# Patient Record
Sex: Male | Born: 1960
Health system: Southern US, Community
[De-identification: ages and names within clinical notes are randomized; demographics above are authoritative.]

## PROBLEM LIST (undated history)

## (undated) DIAGNOSIS — T7840XA Allergy, unspecified, initial encounter: Secondary | ICD-10-CM

## (undated) DIAGNOSIS — K7460 Unspecified cirrhosis of liver: Secondary | ICD-10-CM

## (undated) DIAGNOSIS — D696 Thrombocytopenia, unspecified: Secondary | ICD-10-CM

## (undated) DIAGNOSIS — I10 Essential (primary) hypertension: Secondary | ICD-10-CM

## (undated) DIAGNOSIS — D649 Anemia, unspecified: Secondary | ICD-10-CM

## (undated) DIAGNOSIS — D126 Benign neoplasm of colon, unspecified: Secondary | ICD-10-CM

## (undated) DIAGNOSIS — K769 Liver disease, unspecified: Secondary | ICD-10-CM

## (undated) DIAGNOSIS — N189 Chronic kidney disease, unspecified: Secondary | ICD-10-CM

## (undated) DIAGNOSIS — K2970 Gastritis, unspecified, without bleeding: Secondary | ICD-10-CM

## (undated) DIAGNOSIS — K7689 Other specified diseases of liver: Secondary | ICD-10-CM

## (undated) DIAGNOSIS — B9681 Helicobacter pylori [H. pylori] as the cause of diseases classified elsewhere: Secondary | ICD-10-CM

## (undated) DIAGNOSIS — D732 Chronic congestive splenomegaly: Secondary | ICD-10-CM

## (undated) DIAGNOSIS — G4730 Sleep apnea, unspecified: Secondary | ICD-10-CM

## (undated) HISTORY — DX: Benign neoplasm of colon, unspecified: D12.6

## (undated) HISTORY — DX: Unspecified cirrhosis of liver: K74.60

## (undated) HISTORY — PX: BONE MARROW BIOPSY: SHX199

## (undated) HISTORY — DX: Sleep apnea, unspecified: G47.30

## (undated) HISTORY — DX: Thrombocytopenia, unspecified: D69.6

## (undated) HISTORY — DX: Allergy, unspecified, initial encounter: T78.40XA

## (undated) HISTORY — DX: Chronic kidney disease, unspecified: N18.9

## (undated) HISTORY — DX: Helicobacter pylori (H. pylori) as the cause of diseases classified elsewhere: B96.81

## (undated) HISTORY — DX: Chronic congestive splenomegaly: D73.2

## (undated) HISTORY — DX: Anemia, unspecified: D64.9

## (undated) HISTORY — PX: TOOTH EXTRACTION: SUR596

## (undated) HISTORY — DX: Gastritis, unspecified, without bleeding: K29.70

## (undated) HISTORY — DX: Liver disease, unspecified: K76.9

## (undated) HISTORY — DX: Other specified diseases of liver: K76.89

## (undated) HISTORY — DX: Essential (primary) hypertension: I10

---

## 2001-06-26 ENCOUNTER — Encounter: Payer: Self-pay | Admitting: Family Medicine

## 2001-06-26 ENCOUNTER — Encounter: Admission: RE | Admit: 2001-06-26 | Discharge: 2001-06-26 | Payer: Self-pay | Admitting: Family Medicine

## 2006-02-10 ENCOUNTER — Ambulatory Visit: Payer: Self-pay | Admitting: Family Medicine

## 2006-02-10 LAB — CONVERTED CEMR LAB
ALT: 87 units/L — ABNORMAL HIGH (ref 0–40)
AST: 91 units/L — ABNORMAL HIGH (ref 0–37)
BUN: 13 mg/dL (ref 6–23)
CO2: 30 meq/L (ref 19–32)
Calcium: 9.5 mg/dL (ref 8.4–10.5)
Chloride: 105 meq/L (ref 96–112)
Chol/HDL Ratio, serum: 6.2
Cholesterol: 191 mg/dL (ref 0–200)
Creatinine, Ser: 0.8 mg/dL (ref 0.4–1.2)
GFR calc non Af Amer: 83 mL/min
Glomerular Filtration Rate, Af Am: 100 mL/min/{1.73_m2}
Glucose, Bld: 116 mg/dL — ABNORMAL HIGH (ref 70–99)
HDL: 30.7 mg/dL — ABNORMAL LOW (ref >39.0)
LDL DIRECT: 90.6 mg/dL
Potassium: 3.8 meq/L (ref 3.5–5.1)
Sodium: 140 meq/L (ref 135–145)
Triglyceride fasting, serum: 331 mg/dL (ref 0–149)
VLDL: 66 mg/dL — ABNORMAL HIGH (ref 0–40)

## 2006-04-15 ENCOUNTER — Ambulatory Visit: Payer: Self-pay | Admitting: Family Medicine

## 2006-04-15 LAB — CONVERTED CEMR LAB
ALT: 35 units/L (ref 0–40)
AST: 47 units/L — ABNORMAL HIGH (ref 0–37)
BUN: 15 mg/dL (ref 6–23)
CO2: 31 meq/L (ref 19–32)
Calcium: 10.1 mg/dL (ref 8.4–10.5)
Chloride: 101 meq/L (ref 96–112)
Cholesterol: 210 mg/dL (ref 0–200)
Creatinine, Ser: 0.9 mg/dL (ref 0.4–1.2)
Direct LDL: 139.5 mg/dL
GFR calc Af Amer: 87 mL/min
GFR calc non Af Amer: 72 mL/min
Glucose, Bld: 111 mg/dL — ABNORMAL HIGH (ref 70–99)
HDL: 54.7 mg/dL (ref >39.0)
PSA: 0.31 ng/mL (ref 0.10–4.00)
Potassium: 4.4 meq/L (ref 3.5–5.1)
Sodium: 140 meq/L (ref 135–145)
Total CHOL/HDL Ratio: 3.8
Triglycerides: 96 mg/dL (ref 0–149)
VLDL: 19 mg/dL (ref 0–40)

## 2006-12-08 ENCOUNTER — Ambulatory Visit: Payer: Self-pay | Admitting: Family Medicine

## 2006-12-08 DIAGNOSIS — R7309 Other abnormal glucose: Secondary | ICD-10-CM | POA: Insufficient documentation

## 2006-12-08 DIAGNOSIS — R945 Abnormal results of liver function studies: Secondary | ICD-10-CM | POA: Insufficient documentation

## 2006-12-08 DIAGNOSIS — J309 Allergic rhinitis, unspecified: Secondary | ICD-10-CM | POA: Insufficient documentation

## 2006-12-08 DIAGNOSIS — E785 Hyperlipidemia, unspecified: Secondary | ICD-10-CM | POA: Insufficient documentation

## 2006-12-08 DIAGNOSIS — I10 Essential (primary) hypertension: Secondary | ICD-10-CM | POA: Insufficient documentation

## 2006-12-10 ENCOUNTER — Telehealth (INDEPENDENT_AMBULATORY_CARE_PROVIDER_SITE_OTHER): Payer: Self-pay | Admitting: *Deleted

## 2006-12-11 ENCOUNTER — Encounter (INDEPENDENT_AMBULATORY_CARE_PROVIDER_SITE_OTHER): Payer: Self-pay | Admitting: Family Medicine

## 2006-12-19 ENCOUNTER — Encounter (INDEPENDENT_AMBULATORY_CARE_PROVIDER_SITE_OTHER): Payer: Self-pay | Admitting: Family Medicine

## 2006-12-29 LAB — CONVERTED CEMR LAB
ALT: 147 units/L — ABNORMAL HIGH (ref 0–35)
AST: 265 units/L — ABNORMAL HIGH (ref 0–37)
BUN: 13 mg/dL (ref 6–23)
CO2: 28 meq/L (ref 19–32)
Calcium: 9.4 mg/dL (ref 8.4–10.5)
Chloride: 103 meq/L (ref 96–112)
Cholesterol: 264 mg/dL (ref 0–200)
Creatinine, Ser: 0.8 mg/dL (ref 0.4–1.2)
Direct LDL: 75.2 mg/dL
GFR calc Af Amer: 100 mL/min
GFR calc non Af Amer: 82 mL/min
Glucose, Bld: 120 mg/dL — ABNORMAL HIGH (ref 70–99)
HDL: 29.7 mg/dL — ABNORMAL LOW (ref >39.0)
Potassium: 4 meq/L (ref 3.5–5.1)
Sodium: 138 meq/L (ref 135–145)
Total CHOL/HDL Ratio: 8.9
Triglycerides: 759 mg/dL (ref 0–149)
VLDL: 152 mg/dL — ABNORMAL HIGH (ref 0–40)

## 2007-01-09 ENCOUNTER — Ambulatory Visit: Payer: Self-pay | Admitting: Family Medicine

## 2007-01-12 LAB — CONVERTED CEMR LAB
ALT: 64 units/L — ABNORMAL HIGH (ref 0–53)
AST: 76 units/L — ABNORMAL HIGH (ref 0–37)

## 2007-01-22 ENCOUNTER — Encounter (INDEPENDENT_AMBULATORY_CARE_PROVIDER_SITE_OTHER): Payer: Self-pay | Admitting: Family Medicine

## 2007-01-23 ENCOUNTER — Telehealth (INDEPENDENT_AMBULATORY_CARE_PROVIDER_SITE_OTHER): Payer: Self-pay | Admitting: *Deleted

## 2007-04-13 ENCOUNTER — Encounter: Admission: RE | Admit: 2007-04-13 | Discharge: 2007-04-13 | Payer: Self-pay | Admitting: Gastroenterology

## 2007-04-17 ENCOUNTER — Ambulatory Visit: Payer: Self-pay | Admitting: Family Medicine

## 2007-04-24 ENCOUNTER — Encounter (INDEPENDENT_AMBULATORY_CARE_PROVIDER_SITE_OTHER): Payer: Self-pay | Admitting: *Deleted

## 2007-04-24 ENCOUNTER — Ambulatory Visit: Payer: Self-pay | Admitting: Family Medicine

## 2007-04-24 LAB — CONVERTED CEMR LAB
BUN: 14 mg/dL (ref 6–23)
CO2: 30 meq/L (ref 19–32)
Calcium: 9.6 mg/dL (ref 8.4–10.5)
Chloride: 105 meq/L (ref 96–112)
Cholesterol: 175 mg/dL (ref 0–200)
Creatinine, Ser: 0.8 mg/dL (ref 0.4–1.5)
GFR calc Af Amer: 134 mL/min
GFR calc non Af Amer: 111 mL/min
Glucose, Bld: 101 mg/dL — ABNORMAL HIGH (ref 70–99)
HDL: 29.3 mg/dL — ABNORMAL LOW (ref >39.0)
LDL Cholesterol: 122 mg/dL — ABNORMAL HIGH (ref 0–99)
PSA: 0.3 ng/mL (ref 0.10–4.00)
Potassium: 4.5 meq/L (ref 3.5–5.1)
Sodium: 140 meq/L (ref 135–145)
Total CHOL/HDL Ratio: 6
Triglycerides: 121 mg/dL (ref 0–149)
VLDL: 24 mg/dL (ref 0–40)

## 2007-05-01 ENCOUNTER — Encounter: Payer: Self-pay | Admitting: Internal Medicine

## 2007-07-27 ENCOUNTER — Telehealth (INDEPENDENT_AMBULATORY_CARE_PROVIDER_SITE_OTHER): Payer: Self-pay | Admitting: *Deleted

## 2007-12-10 ENCOUNTER — Telehealth (INDEPENDENT_AMBULATORY_CARE_PROVIDER_SITE_OTHER): Payer: Self-pay | Admitting: *Deleted

## 2008-02-03 ENCOUNTER — Ambulatory Visit: Payer: Self-pay | Admitting: Family Medicine

## 2008-02-08 ENCOUNTER — Telehealth (INDEPENDENT_AMBULATORY_CARE_PROVIDER_SITE_OTHER): Payer: Self-pay | Admitting: *Deleted

## 2008-02-08 LAB — CONVERTED CEMR LAB
ALT: 74 units/L — ABNORMAL HIGH (ref 0–53)
AST: 110 units/L — ABNORMAL HIGH (ref 0–37)
Albumin: 3.5 g/dL (ref 3.5–5.2)
Alkaline Phosphatase: 181 units/L — ABNORMAL HIGH (ref 39–117)
BUN: 18 mg/dL (ref 6–23)
Bilirubin, Direct: 0.4 mg/dL — ABNORMAL HIGH (ref 0.0–0.3)
CO2: 29 meq/L (ref 19–32)
Calcium: 9 mg/dL (ref 8.4–10.5)
Chloride: 103 meq/L (ref 96–112)
Cholesterol: 162 mg/dL (ref 0–200)
Creatinine, Ser: 0.9 mg/dL (ref 0.4–1.5)
Direct LDL: 81.8 mg/dL
GFR calc Af Amer: 117 mL/min
GFR calc non Af Amer: 97 mL/min
Glucose, Bld: 104 mg/dL — ABNORMAL HIGH (ref 70–99)
HDL: 21.9 mg/dL — ABNORMAL LOW (ref >39.0)
Potassium: 4.3 meq/L (ref 3.5–5.1)
Sodium: 139 meq/L (ref 135–145)
Total Bilirubin: 1.9 mg/dL — ABNORMAL HIGH (ref 0.3–1.2)
Total CHOL/HDL Ratio: 7.4
Total Protein: 8.1 g/dL (ref 6.0–8.3)
Triglycerides: 218 mg/dL (ref 0–149)
VLDL: 44 mg/dL — ABNORMAL HIGH (ref 0–40)

## 2008-02-15 ENCOUNTER — Ambulatory Visit: Payer: Self-pay | Admitting: Family Medicine

## 2008-02-15 LAB — CONVERTED CEMR LAB
Basophils Relative: 0 % (ref 0.0–3.0)
Eosinophils Relative: 0 % (ref 0.0–5.0)
HCT: 38.8 % — ABNORMAL LOW (ref 39.0–52.0)
Hemoglobin: 14.2 g/dL (ref 13.0–17.0)
INR: 1.6 — ABNORMAL HIGH (ref 0.8–1.0)
Lymphocytes Relative: 31 % (ref 12.0–46.0)
MCHC: 34.7 g/dL (ref 30.0–36.0)
MCV: 99.1 fL (ref 78.0–100.0)
Monocytes Relative: 9 % (ref 3.0–12.0)
Neutrophils Relative %: 59 % (ref 43.0–77.0)
Platelets: 69 10*3/uL — ABNORMAL LOW (ref 150–400)
Prothrombin Time: 17.2 s — ABNORMAL HIGH (ref 10.9–13.3)
RBC: 3.92 M/uL — ABNORMAL LOW (ref 4.22–5.81)
RDW: 13.2 % (ref 11.5–14.6)
WBC: 5.2 10*3/uL (ref 4.5–10.5)

## 2008-02-17 ENCOUNTER — Telehealth (INDEPENDENT_AMBULATORY_CARE_PROVIDER_SITE_OTHER): Payer: Self-pay | Admitting: *Deleted

## 2008-02-17 DIAGNOSIS — F1010 Alcohol abuse, uncomplicated: Secondary | ICD-10-CM | POA: Insufficient documentation

## 2008-03-17 ENCOUNTER — Telehealth (INDEPENDENT_AMBULATORY_CARE_PROVIDER_SITE_OTHER): Payer: Self-pay | Admitting: *Deleted

## 2008-03-24 ENCOUNTER — Encounter (INDEPENDENT_AMBULATORY_CARE_PROVIDER_SITE_OTHER): Payer: Self-pay | Admitting: *Deleted

## 2008-06-13 ENCOUNTER — Telehealth: Payer: Self-pay | Admitting: Family Medicine

## 2008-06-17 ENCOUNTER — Encounter (INDEPENDENT_AMBULATORY_CARE_PROVIDER_SITE_OTHER): Payer: Self-pay | Admitting: *Deleted

## 2008-09-30 ENCOUNTER — Telehealth (INDEPENDENT_AMBULATORY_CARE_PROVIDER_SITE_OTHER): Payer: Self-pay | Admitting: *Deleted

## 2009-02-08 ENCOUNTER — Ambulatory Visit: Payer: Self-pay | Admitting: Family Medicine

## 2010-06-28 ENCOUNTER — Encounter: Payer: Self-pay | Admitting: Oncology

## 2010-07-03 ENCOUNTER — Other Ambulatory Visit: Payer: Self-pay | Admitting: Oncology

## 2010-07-03 ENCOUNTER — Encounter (HOSPITAL_BASED_OUTPATIENT_CLINIC_OR_DEPARTMENT_OTHER): Payer: 59 | Admitting: Oncology

## 2010-07-03 DIAGNOSIS — D696 Thrombocytopenia, unspecified: Secondary | ICD-10-CM

## 2010-07-03 DIAGNOSIS — I10 Essential (primary) hypertension: Secondary | ICD-10-CM

## 2010-07-03 DIAGNOSIS — L519 Erythema multiforme, unspecified: Secondary | ICD-10-CM

## 2010-07-03 DIAGNOSIS — R7989 Other specified abnormal findings of blood chemistry: Secondary | ICD-10-CM

## 2010-07-03 LAB — CBC WITH DIFFERENTIAL/PLATELET
BASO%: 0.3 % (ref 0.0–2.0)
Basophils Absolute: 0 10*3/uL (ref 0.0–0.1)
EOS%: 0.5 % (ref 0.0–7.0)
HGB: 12.2 g/dL — ABNORMAL LOW (ref 13.0–17.1)
MCH: 32.8 pg (ref 27.2–33.4)
MCHC: 35.6 g/dL (ref 32.0–36.0)
MCV: 92.2 fL (ref 79.3–98.0)
MONO%: 14.7 % — ABNORMAL HIGH (ref 0.0–14.0)
RBC: 3.72 10*6/uL — ABNORMAL LOW (ref 4.20–5.82)
RDW: 17.1 % — ABNORMAL HIGH (ref 11.0–14.6)
lymph#: 1.9 10*3/uL (ref 0.9–3.3)
nRBC: 0 % (ref 0–0)

## 2010-07-03 LAB — MORPHOLOGY

## 2010-07-03 LAB — COMPREHENSIVE METABOLIC PANEL
Albumin: 2.8 g/dL — ABNORMAL LOW (ref 3.5–5.2)
Alkaline Phosphatase: 360 U/L — ABNORMAL HIGH (ref 39–117)
BUN: 10 mg/dL (ref 6–23)
Calcium: 8.2 mg/dL — ABNORMAL LOW (ref 8.4–10.5)
Glucose, Bld: 106 mg/dL — ABNORMAL HIGH (ref 70–99)
Potassium: 3.1 mEq/L — ABNORMAL LOW (ref 3.5–5.3)

## 2010-07-03 LAB — CHCC SMEAR

## 2010-07-04 ENCOUNTER — Ambulatory Visit (HOSPITAL_COMMUNITY)
Admission: RE | Admit: 2010-07-04 | Discharge: 2010-07-04 | Disposition: A | Discharge status: Home / Self Care | Payer: 59 | Source: Ambulatory Visit | Attending: Oncology | Admitting: Oncology

## 2010-07-04 DIAGNOSIS — K7689 Other specified diseases of liver: Secondary | ICD-10-CM | POA: Insufficient documentation

## 2010-07-04 DIAGNOSIS — R7989 Other specified abnormal findings of blood chemistry: Secondary | ICD-10-CM | POA: Insufficient documentation

## 2010-07-04 DIAGNOSIS — D696 Thrombocytopenia, unspecified: Secondary | ICD-10-CM | POA: Insufficient documentation

## 2010-07-05 ENCOUNTER — Other Ambulatory Visit: Payer: Self-pay | Admitting: Oncology

## 2010-07-05 ENCOUNTER — Encounter (HOSPITAL_BASED_OUTPATIENT_CLINIC_OR_DEPARTMENT_OTHER): Payer: 59 | Admitting: Oncology

## 2010-07-05 ENCOUNTER — Other Ambulatory Visit (HOSPITAL_COMMUNITY)
Admission: RE | Admit: 2010-07-05 | Discharge: 2010-07-05 | Disposition: A | Discharge status: Home / Self Care | Payer: 59 | Source: Ambulatory Visit | Attending: Oncology | Admitting: Oncology

## 2010-07-05 DIAGNOSIS — D72821 Monocytosis (symptomatic): Secondary | ICD-10-CM | POA: Insufficient documentation

## 2010-07-05 DIAGNOSIS — D696 Thrombocytopenia, unspecified: Secondary | ICD-10-CM

## 2010-07-05 DIAGNOSIS — D649 Anemia, unspecified: Secondary | ICD-10-CM | POA: Insufficient documentation

## 2010-07-05 DIAGNOSIS — K769 Liver disease, unspecified: Secondary | ICD-10-CM

## 2010-07-05 LAB — CBC
HCT: 34.5 % — ABNORMAL LOW (ref 39.0–52.0)
Hemoglobin: 12 g/dL — ABNORMAL LOW (ref 13.0–17.0)
MCHC: 34.8 g/dL (ref 30.0–36.0)
MCV: 95.3 fL (ref 78.0–100.0)
RDW: 16.9 % — ABNORMAL HIGH (ref 11.5–15.5)

## 2010-07-05 LAB — HIV ANTIBODY (ROUTINE TESTING W REFLEX): HIV: NONREACTIVE

## 2010-07-05 LAB — DIFFERENTIAL
Basophils Relative: 0 % (ref 0–1)
Eosinophils Absolute: 0.1 10*3/uL (ref 0.0–0.7)
Eosinophils Relative: 1 % (ref 0–5)
Lymphs Abs: 3.4 10*3/uL (ref 0.7–4.0)
Monocytes Absolute: 1.4 10*3/uL — ABNORMAL HIGH (ref 0.1–1.0)
Neutro Abs: 5.3 10*3/uL (ref 1.7–7.7)

## 2010-07-05 LAB — HEPATITIS B SURFACE ANTIGEN: Hepatitis B Surface Ag: NEGATIVE

## 2010-07-05 LAB — VITAMIN B12: Vitamin B-12: 1292 pg/mL — ABNORMAL HIGH (ref 211–911)

## 2010-07-05 LAB — PROTEIN ELECTROPHORESIS, SERUM
Alpha-1-Globulin: 3.6 % (ref 2.9–4.9)
Alpha-2-Globulin: 7.1 % (ref 7.1–11.8)
Beta Globulin: 6.2 % (ref 4.7–7.2)
Gamma Globulin: 30 % — ABNORMAL HIGH (ref 11.1–18.8)

## 2010-07-05 LAB — HEPATITIS B SURFACE ANTIBODY,QUALITATIVE: Hep B S Ab: POSITIVE — AB

## 2010-07-11 ENCOUNTER — Other Ambulatory Visit: Payer: Self-pay | Admitting: Oncology

## 2010-07-11 ENCOUNTER — Ambulatory Visit (HOSPITAL_COMMUNITY)
Admission: RE | Admit: 2010-07-11 | Discharge: 2010-07-11 | Disposition: A | Discharge status: Home / Self Care | Payer: 59 | Source: Ambulatory Visit | Attending: Oncology | Admitting: Oncology

## 2010-07-11 ENCOUNTER — Encounter (HOSPITAL_BASED_OUTPATIENT_CLINIC_OR_DEPARTMENT_OTHER): Payer: 59 | Admitting: Oncology

## 2010-07-11 DIAGNOSIS — K7689 Other specified diseases of liver: Secondary | ICD-10-CM | POA: Insufficient documentation

## 2010-07-11 DIAGNOSIS — L519 Erythema multiforme, unspecified: Secondary | ICD-10-CM

## 2010-07-11 DIAGNOSIS — D696 Thrombocytopenia, unspecified: Secondary | ICD-10-CM | POA: Insufficient documentation

## 2010-07-11 DIAGNOSIS — I10 Essential (primary) hypertension: Secondary | ICD-10-CM

## 2010-07-11 DIAGNOSIS — K769 Liver disease, unspecified: Secondary | ICD-10-CM

## 2010-07-11 DIAGNOSIS — R945 Abnormal results of liver function studies: Secondary | ICD-10-CM | POA: Insufficient documentation

## 2010-07-11 LAB — COMPREHENSIVE METABOLIC PANEL
ALT: 98 U/L — ABNORMAL HIGH (ref 0–53)
Albumin: 3.1 g/dL — ABNORMAL LOW (ref 3.5–5.2)
CO2: 23 mEq/L (ref 19–32)
Calcium: 8.6 mg/dL (ref 8.4–10.5)
Chloride: 97 mEq/L (ref 96–112)
Glucose, Bld: 186 mg/dL — ABNORMAL HIGH (ref 70–99)
Potassium: 4.3 mEq/L (ref 3.5–5.3)
Sodium: 132 mEq/L — ABNORMAL LOW (ref 135–145)
Total Protein: 7 g/dL (ref 6.0–8.3)

## 2010-07-11 LAB — CBC WITH DIFFERENTIAL/PLATELET
BASO%: 0 % (ref 0.0–2.0)
Eosinophils Absolute: 0 10*3/uL (ref 0.0–0.5)
MCHC: 34.2 g/dL (ref 32.0–36.0)
MONO#: 0.4 10*3/uL (ref 0.1–0.9)
NEUT#: 4.1 10*3/uL (ref 1.5–6.5)
Platelets: 76 10*3/uL — ABNORMAL LOW (ref 140–400)
RBC: 3.71 10*6/uL — ABNORMAL LOW (ref 4.20–5.82)
WBC: 5.8 10*3/uL (ref 4.0–10.3)
lymph#: 1.2 10*3/uL (ref 0.9–3.3)

## 2010-07-11 MED ORDER — GADOBENATE DIMEGLUMINE 529 MG/ML IV SOLN
15.0000 mL | Freq: Once | INTRAVENOUS | Status: AC | PRN
  Administered 2010-07-11: 15 mL via INTRAVENOUS

## 2010-07-18 ENCOUNTER — Other Ambulatory Visit: Payer: Self-pay | Admitting: Oncology

## 2010-07-18 ENCOUNTER — Encounter (HOSPITAL_BASED_OUTPATIENT_CLINIC_OR_DEPARTMENT_OTHER): Payer: 59 | Admitting: Oncology

## 2010-07-18 DIAGNOSIS — D696 Thrombocytopenia, unspecified: Secondary | ICD-10-CM

## 2010-07-18 LAB — CBC WITH DIFFERENTIAL/PLATELET
BASO%: 0 % (ref 0.0–2.0)
LYMPH%: 14.9 % (ref 14.0–49.0)
MCHC: 35.7 g/dL (ref 32.0–36.0)
MONO#: 0.8 10*3/uL (ref 0.1–0.9)
NEUT#: 6.6 10*3/uL — ABNORMAL HIGH (ref 1.5–6.5)
Platelets: 72 10*3/uL — ABNORMAL LOW (ref 140–400)
RBC: 3.64 10*6/uL — ABNORMAL LOW (ref 4.20–5.82)
RDW: 18.2 % — ABNORMAL HIGH (ref 11.0–14.6)
WBC: 8.7 10*3/uL (ref 4.0–10.3)
lymph#: 1.3 10*3/uL (ref 0.9–3.3)
nRBC: 0 % (ref 0–0)

## 2010-07-24 ENCOUNTER — Other Ambulatory Visit: Payer: Self-pay | Admitting: Oncology

## 2010-07-24 ENCOUNTER — Encounter (HOSPITAL_BASED_OUTPATIENT_CLINIC_OR_DEPARTMENT_OTHER): Payer: 59 | Admitting: Oncology

## 2010-07-24 DIAGNOSIS — D693 Immune thrombocytopenic purpura: Secondary | ICD-10-CM

## 2010-07-24 DIAGNOSIS — D696 Thrombocytopenia, unspecified: Secondary | ICD-10-CM

## 2010-07-24 LAB — CBC WITH DIFFERENTIAL/PLATELET
BASO%: 0.1 % (ref 0.0–2.0)
Basophils Absolute: 0 10*3/uL (ref 0.0–0.1)
EOS%: 0.8 % (ref 0.0–7.0)
HGB: 11.5 g/dL — ABNORMAL LOW (ref 13.0–17.1)
MCH: 32 pg (ref 27.2–33.4)
MONO#: 0.9 10*3/uL (ref 0.1–0.9)
RDW: 18 % — ABNORMAL HIGH (ref 11.0–14.6)
WBC: 7.9 10*3/uL (ref 4.0–10.3)
lymph#: 2.3 10*3/uL (ref 0.9–3.3)

## 2010-07-24 LAB — COMPREHENSIVE METABOLIC PANEL
AST: 97 U/L — ABNORMAL HIGH (ref 0–37)
Albumin: 3.2 g/dL — ABNORMAL LOW (ref 3.5–5.2)
Alkaline Phosphatase: 347 U/L — ABNORMAL HIGH (ref 39–117)
Chloride: 102 mEq/L (ref 96–112)
Glucose, Bld: 175 mg/dL — ABNORMAL HIGH (ref 70–99)
Potassium: 4.2 mEq/L (ref 3.5–5.3)
Sodium: 134 mEq/L — ABNORMAL LOW (ref 135–145)
Total Protein: 6.6 g/dL (ref 6.0–8.3)

## 2010-07-31 ENCOUNTER — Encounter (HOSPITAL_BASED_OUTPATIENT_CLINIC_OR_DEPARTMENT_OTHER): Payer: 59 | Admitting: Oncology

## 2010-07-31 DIAGNOSIS — D693 Immune thrombocytopenic purpura: Secondary | ICD-10-CM

## 2010-07-31 DIAGNOSIS — Z5111 Encounter for antineoplastic chemotherapy: Secondary | ICD-10-CM

## 2010-08-07 ENCOUNTER — Other Ambulatory Visit: Payer: Self-pay | Admitting: Oncology

## 2010-08-07 ENCOUNTER — Encounter (HOSPITAL_BASED_OUTPATIENT_CLINIC_OR_DEPARTMENT_OTHER): Payer: 59 | Admitting: Oncology

## 2010-08-07 DIAGNOSIS — R7401 Elevation of levels of liver transaminase levels: Secondary | ICD-10-CM

## 2010-08-07 DIAGNOSIS — Z5111 Encounter for antineoplastic chemotherapy: Secondary | ICD-10-CM

## 2010-08-07 DIAGNOSIS — D693 Immune thrombocytopenic purpura: Secondary | ICD-10-CM

## 2010-08-07 DIAGNOSIS — R7989 Other specified abnormal findings of blood chemistry: Secondary | ICD-10-CM

## 2010-08-07 DIAGNOSIS — D696 Thrombocytopenia, unspecified: Secondary | ICD-10-CM

## 2010-08-07 DIAGNOSIS — L519 Erythema multiforme, unspecified: Secondary | ICD-10-CM

## 2010-08-07 LAB — CBC WITH DIFFERENTIAL/PLATELET
BASO%: 0.3 % (ref 0.0–2.0)
EOS%: 1.9 % (ref 0.0–7.0)
MCH: 32.2 pg (ref 27.2–33.4)
MCHC: 35.3 g/dL (ref 32.0–36.0)
MCV: 91.2 fL (ref 79.3–98.0)
MONO%: 21 % — ABNORMAL HIGH (ref 0.0–14.0)
RBC: 3.63 10*6/uL — ABNORMAL LOW (ref 4.20–5.82)
RDW: 17.1 % — ABNORMAL HIGH (ref 11.0–14.6)
nRBC: 0 % (ref 0–0)

## 2010-08-14 ENCOUNTER — Encounter (HOSPITAL_BASED_OUTPATIENT_CLINIC_OR_DEPARTMENT_OTHER): Payer: 59 | Admitting: Oncology

## 2010-08-14 DIAGNOSIS — Z5111 Encounter for antineoplastic chemotherapy: Secondary | ICD-10-CM

## 2010-08-14 DIAGNOSIS — D693 Immune thrombocytopenic purpura: Secondary | ICD-10-CM

## 2010-08-21 ENCOUNTER — Encounter (HOSPITAL_BASED_OUTPATIENT_CLINIC_OR_DEPARTMENT_OTHER): Payer: 59 | Admitting: Oncology

## 2010-08-21 DIAGNOSIS — D693 Immune thrombocytopenic purpura: Secondary | ICD-10-CM

## 2010-08-21 DIAGNOSIS — Z5111 Encounter for antineoplastic chemotherapy: Secondary | ICD-10-CM

## 2010-09-10 ENCOUNTER — Encounter (HOSPITAL_BASED_OUTPATIENT_CLINIC_OR_DEPARTMENT_OTHER): Payer: 59 | Admitting: Oncology

## 2010-09-10 ENCOUNTER — Other Ambulatory Visit: Payer: Self-pay | Admitting: Oncology

## 2010-09-10 DIAGNOSIS — R7401 Elevation of levels of liver transaminase levels: Secondary | ICD-10-CM

## 2010-09-10 DIAGNOSIS — D696 Thrombocytopenia, unspecified: Secondary | ICD-10-CM

## 2010-09-10 DIAGNOSIS — L519 Erythema multiforme, unspecified: Secondary | ICD-10-CM

## 2010-09-10 DIAGNOSIS — D693 Immune thrombocytopenic purpura: Secondary | ICD-10-CM

## 2010-09-10 DIAGNOSIS — R7989 Other specified abnormal findings of blood chemistry: Secondary | ICD-10-CM

## 2010-09-10 LAB — COMPREHENSIVE METABOLIC PANEL
ALT: 58 U/L — ABNORMAL HIGH (ref 0–53)
CO2: 25 mEq/L (ref 19–32)
Sodium: 138 mEq/L (ref 135–145)
Total Bilirubin: 3 mg/dL — ABNORMAL HIGH (ref 0.3–1.2)
Total Protein: 6.5 g/dL (ref 6.0–8.3)

## 2010-09-10 LAB — CBC WITH DIFFERENTIAL/PLATELET
BASO%: 0.1 % (ref 0.0–2.0)
LYMPH%: 40.4 % (ref 14.0–49.0)
MCHC: 35.2 g/dL (ref 32.0–36.0)
MONO#: 0.7 10*3/uL (ref 0.1–0.9)
Platelets: 49 10*3/uL — ABNORMAL LOW (ref 140–400)
RBC: 3.4 10*6/uL — ABNORMAL LOW (ref 4.20–5.82)
WBC: 4.5 10*3/uL (ref 4.0–10.3)
lymph#: 1.8 10*3/uL (ref 0.9–3.3)

## 2010-09-24 ENCOUNTER — Encounter (HOSPITAL_BASED_OUTPATIENT_CLINIC_OR_DEPARTMENT_OTHER): Payer: 59 | Admitting: Oncology

## 2010-09-24 ENCOUNTER — Other Ambulatory Visit: Payer: Self-pay | Admitting: Oncology

## 2010-09-24 DIAGNOSIS — R7401 Elevation of levels of liver transaminase levels: Secondary | ICD-10-CM

## 2010-09-24 DIAGNOSIS — D696 Thrombocytopenia, unspecified: Secondary | ICD-10-CM

## 2010-09-24 DIAGNOSIS — L519 Erythema multiforme, unspecified: Secondary | ICD-10-CM

## 2010-09-24 DIAGNOSIS — D693 Immune thrombocytopenic purpura: Secondary | ICD-10-CM

## 2010-09-24 LAB — CBC WITH DIFFERENTIAL/PLATELET
BASO%: 0.3 % (ref 0.0–2.0)
EOS%: 1.4 % (ref 0.0–7.0)
LYMPH%: 26.3 % (ref 14.0–49.0)
MCH: 35 pg — ABNORMAL HIGH (ref 27.2–33.4)
MCHC: 35.3 g/dL (ref 32.0–36.0)
MCV: 99.1 fL — ABNORMAL HIGH (ref 79.3–98.0)
MONO#: 1.1 10*3/uL — ABNORMAL HIGH (ref 0.1–0.9)
MONO%: 19.1 % — ABNORMAL HIGH (ref 0.0–14.0)
Platelets: 59 10*3/uL — ABNORMAL LOW (ref 140–400)
RBC: 3.46 10*6/uL — ABNORMAL LOW (ref 4.20–5.82)
WBC: 5.9 10*3/uL (ref 4.0–10.3)

## 2010-10-08 ENCOUNTER — Other Ambulatory Visit: Payer: Self-pay | Admitting: Oncology

## 2010-10-08 ENCOUNTER — Encounter (HOSPITAL_BASED_OUTPATIENT_CLINIC_OR_DEPARTMENT_OTHER): Payer: 59 | Admitting: Oncology

## 2010-10-08 DIAGNOSIS — D696 Thrombocytopenia, unspecified: Secondary | ICD-10-CM

## 2010-10-08 DIAGNOSIS — D693 Immune thrombocytopenic purpura: Secondary | ICD-10-CM

## 2010-10-08 DIAGNOSIS — R7401 Elevation of levels of liver transaminase levels: Secondary | ICD-10-CM

## 2010-10-08 DIAGNOSIS — L519 Erythema multiforme, unspecified: Secondary | ICD-10-CM

## 2010-10-08 LAB — CBC WITH DIFFERENTIAL/PLATELET
BASO%: 0.6 % (ref 0.0–2.0)
EOS%: 1.5 % (ref 0.0–7.0)
MCHC: 35.1 g/dL (ref 32.0–36.0)
MONO#: 0.8 10*3/uL (ref 0.1–0.9)
RBC: 3.57 10*6/uL — ABNORMAL LOW (ref 4.20–5.82)
WBC: 4.6 10*3/uL (ref 4.0–10.3)
lymph#: 1.6 10*3/uL (ref 0.9–3.3)

## 2010-10-15 ENCOUNTER — Ambulatory Visit (INDEPENDENT_AMBULATORY_CARE_PROVIDER_SITE_OTHER): Payer: 59 | Admitting: Gastroenterology

## 2010-10-15 DIAGNOSIS — R945 Abnormal results of liver function studies: Secondary | ICD-10-CM

## 2010-10-29 ENCOUNTER — Other Ambulatory Visit: Payer: Self-pay | Admitting: Oncology

## 2010-10-29 ENCOUNTER — Encounter (HOSPITAL_BASED_OUTPATIENT_CLINIC_OR_DEPARTMENT_OTHER): Payer: 59 | Admitting: Oncology

## 2010-10-29 DIAGNOSIS — R7401 Elevation of levels of liver transaminase levels: Secondary | ICD-10-CM

## 2010-10-29 DIAGNOSIS — D732 Chronic congestive splenomegaly: Secondary | ICD-10-CM

## 2010-10-29 DIAGNOSIS — D696 Thrombocytopenia, unspecified: Secondary | ICD-10-CM

## 2010-10-29 DIAGNOSIS — L519 Erythema multiforme, unspecified: Secondary | ICD-10-CM

## 2010-10-29 DIAGNOSIS — K7460 Unspecified cirrhosis of liver: Secondary | ICD-10-CM

## 2010-10-29 DIAGNOSIS — D693 Immune thrombocytopenic purpura: Secondary | ICD-10-CM

## 2010-10-29 LAB — COMPREHENSIVE METABOLIC PANEL
AST: 53 U/L — ABNORMAL HIGH (ref 0–37)
Albumin: 3.2 g/dL — ABNORMAL LOW (ref 3.5–5.2)
Alkaline Phosphatase: 365 U/L — ABNORMAL HIGH (ref 39–117)
BUN: 19 mg/dL (ref 6–23)
Calcium: 9.3 mg/dL (ref 8.4–10.5)
Chloride: 93 mEq/L — ABNORMAL LOW (ref 96–112)
Potassium: 4.3 mEq/L (ref 3.5–5.3)
Sodium: 129 mEq/L — ABNORMAL LOW (ref 135–145)
Total Protein: 7.6 g/dL (ref 6.0–8.3)

## 2010-10-29 LAB — CBC WITH DIFFERENTIAL/PLATELET
BASO%: 0.3 % (ref 0.0–2.0)
Basophils Absolute: 0 10*3/uL (ref 0.0–0.1)
HCT: 38.8 % (ref 38.4–49.9)
HGB: 13.4 g/dL (ref 13.0–17.1)
MCHC: 34.7 g/dL (ref 32.0–36.0)
MONO#: 0.6 10*3/uL (ref 0.1–0.9)
NEUT%: 65.5 % (ref 39.0–75.0)
RDW: 15 % — ABNORMAL HIGH (ref 11.0–14.6)
WBC: 4.9 10*3/uL (ref 4.0–10.3)
lymph#: 1.1 10*3/uL (ref 0.9–3.3)

## 2010-12-02 ENCOUNTER — Encounter: Payer: Self-pay | Admitting: Oncology

## 2010-12-02 DIAGNOSIS — D732 Chronic congestive splenomegaly: Secondary | ICD-10-CM | POA: Insufficient documentation

## 2010-12-02 DIAGNOSIS — D696 Thrombocytopenia, unspecified: Secondary | ICD-10-CM | POA: Insufficient documentation

## 2010-12-02 DIAGNOSIS — K7030 Alcoholic cirrhosis of liver without ascites: Secondary | ICD-10-CM | POA: Insufficient documentation

## 2011-01-28 ENCOUNTER — Other Ambulatory Visit: Payer: Self-pay | Admitting: Oncology

## 2011-01-28 ENCOUNTER — Telehealth: Payer: Self-pay | Admitting: *Deleted

## 2011-01-28 ENCOUNTER — Other Ambulatory Visit (HOSPITAL_BASED_OUTPATIENT_CLINIC_OR_DEPARTMENT_OTHER): Payer: 59 | Admitting: Lab

## 2011-01-28 ENCOUNTER — Telehealth: Payer: Self-pay | Admitting: Oncology

## 2011-01-28 DIAGNOSIS — L519 Erythema multiforme, unspecified: Secondary | ICD-10-CM

## 2011-01-28 DIAGNOSIS — D696 Thrombocytopenia, unspecified: Secondary | ICD-10-CM

## 2011-01-28 DIAGNOSIS — D693 Immune thrombocytopenic purpura: Secondary | ICD-10-CM

## 2011-01-28 DIAGNOSIS — R7401 Elevation of levels of liver transaminase levels: Secondary | ICD-10-CM

## 2011-01-28 LAB — CBC WITH DIFFERENTIAL/PLATELET
BASO%: 1.1 % (ref 0.0–2.0)
Basophils Absolute: 0.1 10*3/uL (ref 0.0–0.1)
EOS%: 2.7 % (ref 0.0–7.0)
HGB: 12.3 g/dL — ABNORMAL LOW (ref 13.0–17.1)
MCH: 30 pg (ref 27.2–33.4)
MCHC: 33.6 g/dL (ref 32.0–36.0)
MCV: 89.3 fL (ref 79.3–98.0)
MONO%: 19.4 % — ABNORMAL HIGH (ref 0.0–14.0)
RBC: 4.1 10*6/uL — ABNORMAL LOW (ref 4.20–5.82)
RDW: 17.5 % — ABNORMAL HIGH (ref 11.0–14.6)
lymph#: 1.8 10*3/uL (ref 0.9–3.3)

## 2011-01-28 NOTE — Telephone Encounter (Signed)
Pt returned call and I gave him Platelet results 100 and message from Dr. Gaylyn Rong below.  Pt verbalized understanding and states he has appt to see Dr. Gaylyn Rong in March.

## 2011-01-28 NOTE — Telephone Encounter (Signed)
Message copied by Wende Mott on Mon Jan 28, 2011  1:17 PM ------      Message from: HA, Raliegh Ip T      Created: Mon Jan 28, 2011 10:30 AM       Please call pt.  His plt is still low but improved from before.  Complete abstention from EtOH.  Thanks.  Lab/RV with me in about 3 months.  Scheduler will call for appointment.

## 2011-01-28 NOTE — Telephone Encounter (Signed)
lmonvm of the pt's phone regarding her march 2013 appts

## 2011-01-28 NOTE — Telephone Encounter (Signed)
Left VM for pt to return call so I can relay Dr. Lodema Pilot message below.

## 2011-02-20 ENCOUNTER — Ambulatory Visit (INDEPENDENT_AMBULATORY_CARE_PROVIDER_SITE_OTHER): Payer: 59

## 2011-02-20 DIAGNOSIS — Z23 Encounter for immunization: Secondary | ICD-10-CM

## 2011-03-29 ENCOUNTER — Telehealth: Payer: Self-pay | Admitting: Oncology

## 2011-03-29 NOTE — Telephone Encounter (Signed)
per Dr. Gaylyn Rong called pt and r/s appt on 03/11 to 03/27. asked pt to rtn call to confirm appt

## 2011-04-15 ENCOUNTER — Ambulatory Visit: Payer: 59 | Admitting: Gastroenterology

## 2011-04-29 ENCOUNTER — Ambulatory Visit: Payer: 59 | Admitting: Oncology

## 2011-04-29 ENCOUNTER — Other Ambulatory Visit: Payer: 59

## 2011-05-15 ENCOUNTER — Ambulatory Visit (HOSPITAL_BASED_OUTPATIENT_CLINIC_OR_DEPARTMENT_OTHER): Payer: 59 | Admitting: Oncology

## 2011-05-15 ENCOUNTER — Encounter: Payer: Self-pay | Admitting: Oncology

## 2011-05-15 ENCOUNTER — Other Ambulatory Visit: Payer: 59 | Admitting: Lab

## 2011-05-15 VITALS — BP 126/83 | HR 103 | Temp 97.2°F | Ht 61.0 in | Wt 135.3 lb

## 2011-05-15 DIAGNOSIS — K7460 Unspecified cirrhosis of liver: Secondary | ICD-10-CM

## 2011-05-15 DIAGNOSIS — D649 Anemia, unspecified: Secondary | ICD-10-CM

## 2011-05-15 DIAGNOSIS — K7689 Other specified diseases of liver: Secondary | ICD-10-CM

## 2011-05-15 DIAGNOSIS — D696 Thrombocytopenia, unspecified: Secondary | ICD-10-CM

## 2011-05-15 HISTORY — DX: Other specified diseases of liver: K76.89

## 2011-05-15 LAB — IRON AND TIBC
%SAT: 8 % — ABNORMAL LOW (ref 20–55)
Iron: 44 ug/dL (ref 42–165)
TIBC: 538 ug/dL — ABNORMAL HIGH (ref 215–435)
UIBC: 494 ug/dL — ABNORMAL HIGH (ref 125–400)

## 2011-05-15 LAB — CBC WITH DIFFERENTIAL/PLATELET
BASO%: 1.2 % (ref 0.0–2.0)
Basophils Absolute: 0.1 10*3/uL (ref 0.0–0.1)
EOS%: 0.7 % (ref 0.0–7.0)
Eosinophils Absolute: 0 10*3/uL (ref 0.0–0.5)
HCT: 31.8 % — ABNORMAL LOW (ref 38.4–49.9)
HGB: 10.5 g/dL — ABNORMAL LOW (ref 13.0–17.1)
LYMPH%: 30.5 % (ref 14.0–49.0)
MCH: 31.7 pg (ref 27.2–33.4)
MCHC: 33 g/dL (ref 32.0–36.0)
MCV: 96.1 fL (ref 79.3–98.0)
MONO#: 0.8 10*3/uL (ref 0.1–0.9)
MONO%: 20 % — ABNORMAL HIGH (ref 0.0–14.0)
NEUT#: 1.9 10*3/uL (ref 1.5–6.5)
NEUT%: 47.6 % (ref 39.0–75.0)
Platelets: 101 10*3/uL — ABNORMAL LOW (ref 140–400)
RBC: 3.31 10*6/uL — ABNORMAL LOW (ref 4.20–5.82)
RDW: 14.9 % — ABNORMAL HIGH (ref 11.0–14.6)
WBC: 4.1 10*3/uL (ref 4.0–10.3)
lymph#: 1.2 10*3/uL (ref 0.9–3.3)

## 2011-05-15 LAB — COMPREHENSIVE METABOLIC PANEL
Albumin: 3.1 g/dL — ABNORMAL LOW (ref 3.5–5.2)
Alkaline Phosphatase: 330 U/L — ABNORMAL HIGH (ref 39–117)
BUN: 15 mg/dL (ref 6–23)
Calcium: 8.5 mg/dL (ref 8.4–10.5)
Chloride: 103 mEq/L (ref 96–112)
Glucose, Bld: 227 mg/dL — ABNORMAL HIGH (ref 70–99)
Potassium: 4.3 mEq/L (ref 3.5–5.3)

## 2011-05-15 LAB — FERRITIN: Ferritin: 22 ng/mL (ref 22–322)

## 2011-05-15 LAB — CHCC SMEAR

## 2011-05-15 LAB — MORPHOLOGY: PLT EST: DECREASED

## 2011-05-15 LAB — VITAMIN B12: Vitamin B-12: 1144 pg/mL — ABNORMAL HIGH (ref 211–911)

## 2011-05-15 NOTE — Progress Notes (Signed)
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   PAST  TREATMENT:  He was first thought to have ITP and received Prednisone taper course starting on Jul 04, 2010 without response.  He received 4 weekly doses of Rituxan between 07/31/2010 and 08/21/2010 without response either.   CURRENT TREATMENT:  watchful observation.  INTERVAL HISTORY: Arthur Woodard 51 y.o. male returns for regular follow up with his wife.  He reported feeling well.  He missed his appointment with Dr. Julieta Gutting in Feb 2013 but is planning to call soon to reschedule.  He had significant weight loss last year up to 30-lb while his DM was poorly controlled.  He was recently started on DM medications.  He has not kept a record of his fasting CBG and could not tell me how his fasting CBG runs.   Patient denies fatigue, headache, visual changes, confusion, drenching night sweats, palpable lymph node swelling, mucositis, odynophagia, dysphagia, nausea vomiting, jaundice, chest pain, palpitation, shortness of breath, dyspnea on exertion, productive cough, gum bleeding, epistaxis, hematemesis, hemoptysis, abdominal pain, abdominal swelling, early satiety, melena, hematochezia, hematuria, skin rash, spontaneous bleeding, joint swelling, joint pain, heat or cold intolerance, bowel bladder incontinence, back pain, focal motor weakness, paresthesia, depression, suicidal or homocidal ideation, feeling hopelessness.  He has not noticed any blister in his mouth anymore.  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     Past Medical History  Diagnosis Date  . Cirrhosis   . Splenomegaly, congestive, chronic   . Thrombocytopenia   . Liver nodule 05/15/2011    No past surgical history on file.  Current Outpatient Prescriptions  Medication Sig Dispense Refill  . cetirizine (ZYRTEC) 10 MG tablet Take 10 mg by mouth daily.       . fluticasone (VERAMYST) 27.5 MCG/SPRAY nasal spray Place 2 sprays into the nose daily.      Marland Kitchen glimepiride (AMARYL) 4 MG tablet Take 4 mg by mouth Daily.      Marland Kitchen JANUMET 50-1000 MG per tablet Take 50-1,000 mg by mouth. Take 1 tablet bid      . lisinopril (PRINIVIL,ZESTRIL) 20 MG tablet Take 20 mg by mouth daily.      . hydrochlorothiazide (HYDRODIURIL) 25 MG tablet Take 25 mg by mouth daily.        ALLERGIES:   has no known allergies.  REVIEW OF SYSTEMS:  The rest of the 14-point review of system was negative.   Filed Vitals:   05/15/11 1425  BP: 126/83  Pulse: 103  Temp: 97.2 F (36.2 C)   Wt Readings from Last 3 Encounters:  05/15/11 135 lb 4.8 oz (61.372 kg)  10/29/10 145 lb (65.772 kg)  02/03/08 150 lb 4.8 oz (68.176 kg)   ECOG Performance status: 0  PHYSICAL EXAMINATION:   General:  well-nourished in no acute distress.  Eyes:  no scleral icterus.  ENT:  There were no oropharyngeal lesions.  Neck was without thyromegaly.  Lymphatics:  Negative cervical, supraclavicular or axillary adenopathy.  Respiratory: lungs were clear bilaterally without wheezing or crackles.  Cardiovascular:  Regular rate and rhythm, S1/S2, without murmur, rub or gallop.  There was no pedal edema.  GI:  abdomen was soft, flat, nontender, nondistended.  I was able to feel the splenic edge about 5cm below costal margin.  Rectal exam with normal rectal tone with brown stool, Guaic negative.  Muscoloskeletal:  no spinal  tenderness of palpation of vertebral spine.  Skin exam was without echymosis, petichae.  Neuro exam was nonfocal.  Patient was able to get on and off exam table without assistance.  Gait was normal.  Patient was alerted and oriented.  Attention was good.   Language was appropriate.  Mood was normal without depression.  Speech was not pressured.  Thought content was not tangential.     LABORATORY/RADIOLOGY DATA:  Lab Results  Component Value Date   WBC 4.1 05/15/2011   HGB 10.5* 05/15/2011     HCT 31.8* 05/15/2011   PLT 101* 05/15/2011   GLUCOSE 548* 10/29/2010   CHOL 162 02/03/2008   TRIG 218* 02/03/2008   HDL 21.9* 02/03/2008   LDLDIRECT 81.8 02/03/2008   LDLCALC 122* 04/24/2007   ALT 57* 10/29/2010   AST 53* 10/29/2010   NA 129* 10/29/2010   K 4.3 10/29/2010   CL 93* 10/29/2010   CREATININE 0.89 10/29/2010   BUN 19 10/29/2010   CO2 24 10/29/2010   PSA 0.30 04/24/2007   INR 1.6 RATIO* 02/15/2008   BLOOD SMEAR:  I personally reviewed the patient's peripheral blood smear today.  There was isocytosis.  There was no peripheral blast.  There was no schistocytosis, spherocytosis, target cell, rouleaux formation, tear drop cell.  There was no giant platelets or platelet clumps.     ASSESSMENT AND PLAN:    1.   Thrombocytopenia: most likely due to hepatic congestion.  His workup with Dr. Julieta Gutting last year showed evidence of hepatic insufficiency.  He also has exam finding of splenomegaly.  Bone marrow biopsy in the past was unrevealing.  His plt is stable now.  There is no active bleeding.  There is no need for transfusion.  Rx underlying dz. 2.  New onset anemia, normocytic:  I sent for anemia work up today with LDH, Tbili, folate, Vit B12, iron panel.  However given that he has cirrhosis and worsening anemia, I referred him to [facility] to see if it's appropriate to have EGD and age-appropriate colonoscopy which he has not had one.  Rectal exam today was Guiac negative.  3.  Erythema in buccal mucosa:  Resolved.   Was on prednisone per Rheum. 4.  Diabete Mellitus, type II:   He is now on Glimepiride, Janumet per PCP.  5.  HTN:  Well controlled on Lisinopril and HCTZ per PCP. 6.  Cirrhosis:  Unclear etiology (? EtOH?).  I strongly urged him to call Dr. Luisa Dago office to reschedule his missed appointment in Feb 2013.  I gave him Dr. Julieta Gutting office's number.  7.  History of liver nodules on MRI liver in 06/2010.  I referred him for a repeat liver MRI for stability of these nodules.    8.   Follow up:  Lab only in 1,2, and 3 months to monitor stability of his anemia.  He has appointment with midlevel in 6 months and with me in 12 months.    The length of time of the face-to-face encounter was 25 minutes. More than 50% of time was spent counseling and coordination of care.

## 2011-05-15 NOTE — Patient Instructions (Signed)
1.  Anemia and low platelet count:  From presumed liver problem. Previous bone marrow biopsy was negative.  We'll continue observation.  We may consider repeating bone marrow biopsy in the future if your blood count gets much worse. 2.  Liver cirrhosis:  Will repeat Liver MRI within 2-3 wks to ensure that every thing is OK.  Please also follow up with your liver specialist.

## 2011-05-16 ENCOUNTER — Telehealth: Payer: Self-pay | Admitting: *Deleted

## 2011-05-16 ENCOUNTER — Telehealth: Payer: Self-pay | Admitting: Oncology

## 2011-05-16 NOTE — Progress Notes (Signed)
ADDENDUM:    1.  Anemia:  His iron panel came back consistent with iron deficiency anemia.  I advised him to start oral iron NuIron 150mg  PO daily or Slow Fe 325mg  PO daily (along with Vit C).  If his anemia worsens despite this, we may consider IV Ferahem.  I strongly urged him to establish care with [facility] to consider EGD and colonoscopy.

## 2011-05-16 NOTE — Telephone Encounter (Signed)
Patient never saw [facility]. He saw a liver doctor Dr. Julieta Gutting who does not perform endoscopy. He needs a GI doctor to perform endoscopy for anemia.   Aram Beecham, would you please help me to get this patient in to see one of your GI docs.   Thanks.   ----- Message ----- From: Rhae Lerner Sent: 05/16/2011 10:00 AM To: Exie Parody, MD, Earnest Conroy [facility]   Morning,  Have this patient ever seen a GI before and if so could you please provided the name. I called [facility] to scheduled appt and they stated that the information that you provided is not enough they need to know if patient has ever seen a GI before.  Thanks  After reviewing Dr Lodema Pilot note, Dr Rhea Belton felt an OV is needed rather than a DIrect ECL. Pt is scheduled for 05/27/11 at 11am; appointment and NP questionaire mailed to the pt.

## 2011-05-16 NOTE — Telephone Encounter (Signed)
Called Cynthia at [facility] and she stated that she will call pt with appt d/t

## 2011-05-16 NOTE — Telephone Encounter (Signed)
Gv pt appt for april-march2014.  scheduled pt for mri for 05/21/2011 @ [facility]

## 2011-05-17 NOTE — Progress Notes (Unsigned)
Received pre-authorization confirmation for MRI abdomen, from [facility]; forwarded to Dr. Gaylyn Rong.

## 2011-05-21 ENCOUNTER — Ambulatory Visit (HOSPITAL_COMMUNITY)
Admission: RE | Admit: 2011-05-21 | Discharge: 2011-05-21 | Disposition: A | Discharge status: Home / Self Care | Payer: 59 | Source: Ambulatory Visit | Attending: Oncology | Admitting: Oncology

## 2011-05-21 DIAGNOSIS — K7689 Other specified diseases of liver: Secondary | ICD-10-CM | POA: Insufficient documentation

## 2011-05-21 DIAGNOSIS — D696 Thrombocytopenia, unspecified: Secondary | ICD-10-CM

## 2011-05-21 DIAGNOSIS — R7989 Other specified abnormal findings of blood chemistry: Secondary | ICD-10-CM | POA: Insufficient documentation

## 2011-05-21 DIAGNOSIS — K8020 Calculus of gallbladder without cholecystitis without obstruction: Secondary | ICD-10-CM | POA: Insufficient documentation

## 2011-05-21 DIAGNOSIS — K7460 Unspecified cirrhosis of liver: Secondary | ICD-10-CM

## 2011-05-21 DIAGNOSIS — D649 Anemia, unspecified: Secondary | ICD-10-CM

## 2011-05-21 MED ORDER — GADOBENATE DIMEGLUMINE 529 MG/ML IV SOLN
15.0000 mL | Freq: Once | INTRAVENOUS | Status: AC | PRN
Start: 2011-05-21 — End: 2011-05-21
  Administered 2011-05-21: 12 mL via INTRAVENOUS

## 2011-05-23 ENCOUNTER — Encounter: Payer: Self-pay | Admitting: Internal Medicine

## 2011-05-27 ENCOUNTER — Encounter: Payer: Self-pay | Admitting: Internal Medicine

## 2011-05-27 ENCOUNTER — Ambulatory Visit (INDEPENDENT_AMBULATORY_CARE_PROVIDER_SITE_OTHER): Payer: 59 | Admitting: Internal Medicine

## 2011-05-27 VITALS — BP 110/70 | HR 80 | Ht 62.0 in | Wt 134.4 lb

## 2011-05-27 DIAGNOSIS — R7401 Elevation of levels of liver transaminase levels: Secondary | ICD-10-CM

## 2011-05-27 DIAGNOSIS — K7689 Other specified diseases of liver: Secondary | ICD-10-CM

## 2011-05-27 DIAGNOSIS — K7460 Unspecified cirrhosis of liver: Secondary | ICD-10-CM

## 2011-05-27 DIAGNOSIS — D509 Iron deficiency anemia, unspecified: Secondary | ICD-10-CM

## 2011-05-27 DIAGNOSIS — D696 Thrombocytopenia, unspecified: Secondary | ICD-10-CM

## 2011-05-27 MED ORDER — PEG-KCL-NACL-NASULF-NA ASC-C 100 G PO SOLR: 1.0000 | Freq: Once | ORAL | Status: DC

## 2011-05-27 NOTE — Progress Notes (Signed)
Subjective:    Patient ID: Arthur Woodard, male    DOB: 02-14-1961, 51 y.o.   MRN: [id number]  HPI Mr. Hang is a 51 yo male with PMH of thrombocytopenia, initially of uncertain etiology, who is seen in consultation at the request of Dr. Gaylyn Rong for evaluation of iron deficiency anemia. The patient has a history of thrombocytopenia, and initially carried a diagnosis of ITP which did not respond to Rituxan or steroids. Eventually be diagnosis of cirrhosis with portal hypertension was entertained, and at present this is felt to be the most likely. Imaging revealed liver nodules, and he recently had an MRI to further investigate these liver nodules. Also, he was noted to have an anemia, and iron studies revealed iron deficiency which prompted referral to me. In the past he was referred to Dr. Julieta Gutting for a hepatologist opinion. He still has followup with Dr. Julieta Gutting coming up.  He reports no prior history of liver disease, including no history of jaundice, ascites, GI bleeding. He has been checked for viral hepatitis and negative. He does have a positive hepatitis B surface antibody, and he does not recall being vaccinated, so perhaps he was exposed. He currently denies GI complaints. No nausea, vomiting. No abdominal pain. No weight loss or early satiety. No heartburn, dysphagia or odynophagia. No change in bowel habits, melena, Hadrian red blood per rectum. No fever or chills. He also had a bone marrow biopsy which was unrevealing and the workup is thrombocytopenia.  Review of Systems As per history of present illness, otherwise neg  Past Medical History  Diagnosis Date  . Cirrhosis   . Splenomegaly, congestive, chronic   . Thrombocytopenia   . Liver nodule 05/15/2011  DM HTN  Current Outpatient Prescriptions  Medication Sig Dispense Refill  . cetirizine (ZYRTEC) 10 MG tablet Take 10 mg by mouth daily.      . fluticasone (VERAMYST) 27.5 MCG/SPRAY nasal spray Place 2 sprays into the nose daily.        Marland Kitchen glimepiride (AMARYL) 4 MG tablet Take 4 mg by mouth Daily.      Marland Kitchen JANUMET 50-1000 MG per tablet Take 50-1,000 mg by mouth. Take 1 tablet bid      . lisinopril (PRINIVIL,ZESTRIL) 20 MG tablet Take 20 mg by mouth daily.      . peg 3350 powder (MOVIPREP) SOLR Take 1 kit (100 g total) by mouth once.  1 kit  0   No Known Allergies  FH - neg for liver dx, IBD, CRC  History   Social History  . Marital Status: Married    Spouse Name: N/A    Number of Children: 2  . Years of Education: N/A   Occupational History  . cook    Social History Main Topics  . Smoking status: Never Smoker   . Smokeless tobacco: Never Used  . Alcohol Use: Yes     2 beers/day  . Drug Use: No  . Sexually Active: None   Other Topics Concern  . None   Social History Narrative  . None      Objective:   Physical Exam BP 110/70  Pulse 80  Ht 5\' 2"  (1.575 m)  Wt 134 lb 6.4 oz (60.963 kg)  BMI 24.58 kg/m2 Constitutional: Well-developed and well-nourished. No distress. HEENT: Normocephalic and atraumatic. Oropharynx is clear and moist. No oropharyngeal exudate. Conjunctivae are normal. Pupils are equal round and reactive to light. No scleral icterus. Neck: Neck supple. Trachea midline. Cardiovascular: Normal rate, regular rhythm  and intact distal pulses. No M/R/G Pulmonary/chest: Effort normal and breath sounds normal. No wheezing, rales or rhonchi. Abdominal: Soft, nontender, nondistended. Bowel sounds active throughout. There are no masses palpable. Liver is enlarged with partially 3 cm below the right costal, no splenomegaly Extremities: no clubbing, cyanosis, or edema Lymphadenopathy: No cervical adenopathy noted. Neurological: Alert and oriented to person place and time. Skin: Skin is warm and dry. No rashes noted. Psychiatric: Normal mood and affect. Behavior is normal.  MRI ABDOMEN WITHOUT AND WITH CONTRAST 05/15/11   Technique:  Multiplanar multisequence MR imaging of the abdomen was performed  both before and after the administration of intravenous contrast.   Contrast: 12mL MULTIHANCE GADOBENATE DIMEGLUMINE 529 MG/ML IV SOLN   Comparison:  MRI abdomen dated 07/11/2010   Findings:  Mildly nodular hepatic contour, compatible with known cirrhosis.  No hepatic steatosis.   Multiple arterially enhancing nodules, approximately 15 in number, many of which are mildly T1 hyperintense.  Dominant nodule measures 10 x 9 mm in the anterior segment right hepatic lobe (series 1201/image 41).  No definite washout on delayed phase images.  No restricted diffusion.  Overall, these findings are compatible with regenerating and/or dysplastic nodules.   Layering gallstones with possible mild gallbladder wall edema.  No definite superimposed inflammatory changes by MR.  No intrahepatic or extrahepatic ductal dilatation.   Spleen, pancreas, and adrenal glands are within normal limits.   Kidneys are unremarkable.  No hydronephrosis.   No abdominal ascites.   Portal vein remains patent.   No suspicious abdominal lymphadenopathy.   No suspicious osseous lesions.   IMPRESSION: Mildly nodular hepatic contour, compatible with known cirrhosis.   Multiple arterially enhancing nodules, approximately 15 in number, including a dominant 10 x 9 mm nodule in the anterior segment right hepatic lobe.  Overall imaging features are most compatible with regenerating and/or dysplastic nodules.   No imaging features specific for hepatocellular carcinoma. Correlation with serum AFP and follow-up surveillance MR imaging is suggested.   Cholelithiasis with possible gallbladder wall edema.  No definite superimposed inflammatory changes by MR.  If there are clinical signs/symptoms suspicious for acute cholecystitis, consider right upper quadrant ultrasound for further characterization.  Labs: ferritin 22, TIBC greater than 500, percent saturation 8  CBC    Component Value Date/Time   WBC 4.1  05/15/2011 1406   WBC 10.2 07/05/2010 0805   RBC 3.31* 05/15/2011 1406   RBC 3.62* 07/05/2010 0805   HGB 10.5* 05/15/2011 1406   HGB 12.0* 07/05/2010 0805   HCT 31.8* 05/15/2011 1406   HCT 34.5* 07/05/2010 0805   PLT 101* 05/15/2011 1406   PLT 63 SPECIMEN CHECKED FOR CLOTS REPEATED TO VERIFY PLATELET COUNT CONFIRMED BY SMEAR LARGE PLATELETS PRESENT* 07/05/2010 0805   MCV 96.1 05/15/2011 1406   MCV 95.3 07/05/2010 0805   MCH 31.7 05/15/2011 1406   MCH 33.1 07/05/2010 0805   MCHC 33.0 05/15/2011 1406   MCHC 34.8 07/05/2010 0805   RDW 14.9* 05/15/2011 1406   RDW 16.9* 07/05/2010 0805   LYMPHSABS 1.2 05/15/2011 1406   LYMPHSABS 3.4 07/05/2010 0805   MONOABS 0.8 05/15/2011 1406   MONOABS 1.4* 07/05/2010 0805   EOSABS 0.0 05/15/2011 1406   EOSABS 0.1 07/05/2010 0805   BASOSABS 0.1 05/15/2011 1406   BASOSABS 0.0 07/05/2010 0805    Component     Latest Ref Rng 02/03/2008 07/03/2010 07/11/2010 07/24/2010 09/10/2010  Sodium     135 - 145 mEq/L 139 134 (L) 132 (L) 134 (L) 138  Potassium     3.5 - 5.3 mEq/L 4.3 3.1 (L) 4.3 4.2 3.8  Chloride     96 - 112 mEq/L 103 101 97 102 102  CO2     19 - 32 mEq/L 29 23 23 27 25   Glucose     70 - 99 mg/dL 161 (H) 096 (H) 045 (H) 175 (H) 129 (H)  BUN     6 - 23 mg/dL 18 10 19 20 9   Creat     0.50 - 1.35 mg/dL 0.9 4.09 8.11 9.14 7.82  Calcium     8.4 - 10.5 mg/dL 9.0 8.2 (L) 8.6 8.4 7.7 (L)  Total Bilirubin     0.3 - 1.2 mg/dL 1.9 (H) 2.9 (H) 2.9 (H) 2.0 (H) 3.0 (H)  Alkaline Phosphatase     39 - 117 U/L 181 (H) 360 (H) 370 (H) 347 (H) 278 (H)  AST     0 - 37 U/L 110 (H) 130 (H) 121 (H) 97 (H) 134 (H)  ALT     0 - 53 U/L 74 (H) 82 (H) 98 (H) 74 (H) 58 (H)  Total Protein     6.0 - 8.3 g/dL 8.1 7.5 7.0 6.6 6.5  Albumin     3.5 - 5.2 g/dL 3.5 2.8 (L) 3.1 (L) 3.2 (L) 2.6 (L)   Component     Latest Ref Rng 10/29/2010 05/15/2011  Sodium     135 - 145 mEq/L 129 (L) 138  Potassium     3.5 - 5.3 mEq/L 4.3 4.3  Chloride     96 - 112 mEq/L 93 (L) 103  CO2     19 - 32  mEq/L 24 24  Glucose     70 - 99 mg/dL 956 (H) 213 (H)  BUN     6 - 23 mg/dL 19 15  Creat     0.86 - 1.35 mg/dL 5.78 4.69  Calcium     8.4 - 10.5 mg/dL 9.3 8.5  Total Bilirubin     0.3 - 1.2 mg/dL 1.7 (H) 1.0  Alkaline Phosphatase     39 - 117 U/L 365 (H) 330 (H)  AST     0 - 37 U/L 53 (H) 90 (H)  ALT     0 - 53 U/L 57 (H) 76 (H)  Total Protein     6.0 - 8.3 g/dL 7.6 6.9  Albumin     3.5 - 5.2 g/dL 3.2 (L) 3.1 (L)       Assessment & Plan:   51 yo male with PMH of thrombocytopenia, initially of uncertain etiology, who is seen in consultation at the request of Dr. Gaylyn Rong for evaluation of iron deficiency anemia, also with imaging consistent with cirrhosis.  1. IDA -- the patient's labs are consistent with iron deficiency anemia, and for this I recommended endoscopy and colonoscopy. We discussed this test including the risks and benefits, and he is agreeable to proceed.  2. Cirrhosis -- the patient does have imaging consistent with cirrhosis, and he has promised cytopenia consistent with portal hypertension. The etiology of his cirrhosis is unclear, but on questioning he does have along history of daily alcohol use. It seems that his use has been moderate and at times had a period. Review his labs, he has had periods where his AST to ALT ratio is consistent with alcoholic hepatitis, though there are other periods where this ratio is not consistent with alcoholic hepatitis. Given his unknown etiology of liver inflammation,  I recommended a trial of alcohol cessation. He feels that this is very possible with no risk of withdrawal. He will cease all alcohol for a period of 3 weeks, and at the end we will repeat his hepatic function panel. If his transaminases remain elevated, I strongly recommend liver biopsy. He is agreeable to this plan.  3. Liver nodules -- the patient's liver nodules are felt to most likely be regenerative, but given his history of cirrhosis, I recommend repeating the MRI in  6 month's time. This test will be ordered today.  Follow up to be determined after endoscopy

## 2011-05-27 NOTE — Patient Instructions (Addendum)
You have been scheduled for a colonoscopy/endoscopy with propofol. Please follow written instructions given to you at your visit today.  Please pick up your prep kit at the pharmacy within the next 1-3 days.  Your physician has requested that you go to the basement for lab work before leaving today: 3 weeks out.   You have been scheduled for an MRI at [facility] on 10/28/2011 @ 10:30am  Located at [address], if you cannot make this appointment please call [phone] to reschedule.  Dr Rhea Belton would like you to abstain from any alcohol for at least 3 weeks, after that please go to our lab to have blood drawn approx. Date 06/18/2011   _  _   ORAL DIABETIC MEDICATION INSTRUCTIONS  The day before your procedure:  Take your diabetic pill as you do normally  The day of your procedure:  Do not take your diabetic pill   We will check your blood sugar levels during the admission process and again in Recovery before discharging you home  ________________________________________________________________________

## 2011-05-30 ENCOUNTER — Encounter: Payer: 59 | Admitting: Internal Medicine

## 2011-06-14 ENCOUNTER — Telehealth: Payer: Self-pay | Admitting: *Deleted

## 2011-06-14 ENCOUNTER — Other Ambulatory Visit (HOSPITAL_BASED_OUTPATIENT_CLINIC_OR_DEPARTMENT_OTHER): Payer: 59 | Admitting: Lab

## 2011-06-14 DIAGNOSIS — D649 Anemia, unspecified: Secondary | ICD-10-CM

## 2011-06-14 DIAGNOSIS — K7689 Other specified diseases of liver: Secondary | ICD-10-CM

## 2011-06-14 DIAGNOSIS — D696 Thrombocytopenia, unspecified: Secondary | ICD-10-CM

## 2011-06-14 DIAGNOSIS — K7460 Unspecified cirrhosis of liver: Secondary | ICD-10-CM

## 2011-06-14 LAB — CBC WITH DIFFERENTIAL/PLATELET
Basophils Absolute: 0 10*3/uL (ref 0.0–0.1)
EOS%: 4.8 % (ref 0.0–7.0)
Eosinophils Absolute: 0.2 10*3/uL (ref 0.0–0.5)
HCT: 36.9 % — ABNORMAL LOW (ref 38.4–49.9)
HGB: 12.2 g/dL — ABNORMAL LOW (ref 13.0–17.1)
MCH: 33.8 pg — ABNORMAL HIGH (ref 27.2–33.4)
MCV: 102 fL — ABNORMAL HIGH (ref 79.3–98.0)
NEUT#: 1.5 10*3/uL (ref 1.5–6.5)
NEUT%: 40.5 % (ref 39.0–75.0)
lymph#: 1.1 10*3/uL (ref 0.9–3.3)

## 2011-06-14 NOTE — Telephone Encounter (Signed)
Called Arthur Woodard and informed of Hgb improved per Dr. Gaylyn Rong.  Instructed to continue oral iron and abstain from alcohol.  Arthur Woodard verbalized understanding and informs he has Colonoscopy/EGD scheduled for 06/17/11 with Dr. Rhea Belton.

## 2011-06-14 NOTE — Telephone Encounter (Signed)
Message copied by Wende Mott on Fri Jun 14, 2011 10:25 AM ------      Message from: Jethro Bolus T      Created: Fri Jun 14, 2011  9:28 AM       Please call pt.  His Hgb has improved.  His plt has slightly decreased.  He needs to f/u with Dr. Rhea Belton, GI to see when he will do endoscopy.  Continue to abstain from EtOH.  Continue oral iron as tolerated.  Thanks.

## 2011-06-17 ENCOUNTER — Ambulatory Visit (AMBULATORY_SURGERY_CENTER): Payer: 59 | Admitting: Internal Medicine

## 2011-06-17 ENCOUNTER — Encounter: Payer: Self-pay | Admitting: Internal Medicine

## 2011-06-17 VITALS — BP 136/83 | HR 77 | Temp 97.4°F | Resp 16 | Ht 62.0 in | Wt 134.0 lb

## 2011-06-17 DIAGNOSIS — K766 Portal hypertension: Secondary | ICD-10-CM

## 2011-06-17 DIAGNOSIS — K7460 Unspecified cirrhosis of liver: Secondary | ICD-10-CM

## 2011-06-17 DIAGNOSIS — D509 Iron deficiency anemia, unspecified: Secondary | ICD-10-CM

## 2011-06-17 DIAGNOSIS — K319 Disease of stomach and duodenum, unspecified: Secondary | ICD-10-CM

## 2011-06-17 DIAGNOSIS — A048 Other specified bacterial intestinal infections: Secondary | ICD-10-CM

## 2011-06-17 DIAGNOSIS — Z1211 Encounter for screening for malignant neoplasm of colon: Secondary | ICD-10-CM

## 2011-06-17 LAB — GLUCOSE, CAPILLARY
Glucose-Capillary: 102 mg/dL — ABNORMAL HIGH (ref 70–99)
Glucose-Capillary: 87 mg/dL (ref 70–99)

## 2011-06-17 MED ORDER — SODIUM CHLORIDE 0.9 % IV SOLN: 500.0000 mL | INTRAVENOUS | Status: DC

## 2011-06-17 NOTE — Patient Instructions (Signed)

## 2011-06-17 NOTE — Op Note (Signed)
[facility] [address] [place], [place]  [address]  COLONOSCOPY PROCEDURE REPORT  PATIENT:  Arthur Woodard, Arthur Woodard  MR#:  [id number] BIRTHDATE:  10-06-1960, 51 yrs. old  GENDER:  male ENDOSCOPIST:  Carie Caddy. Kealie Barrie, MD REF. BY:  Jethro Bolus, M.D. PROCEDURE DATE:  06/17/2011 PROCEDURE:  Colonoscopy with snare polypectomy ASA CLASS:  Class III INDICATIONS:  Iron Deficiency Anemia, Routine Risk Screening, 1st colonoscopy MEDICATIONS:   MAC sedation, administered by CRNA, propofol (Diprivan) 230 mg IV  DESCRIPTION OF PROCEDURE:   After the risks benefits and alternatives of the procedure were thoroughly explained, informed consent was obtained.  Digital rectal exam was performed and revealed no rectal masses.   The LB [id number] endoscope was introduced through the anus and advanced to the cecum, which was identified by both the appendix and ileocecal valve, without limitations.  The quality of the prep was good, using MoviPrep. The instrument was then slowly withdrawn as the colon was fully examined. <<PROCEDUREIMAGES>>  FINDINGS:  A 6 mm pedunculated polyp was found in the ascending colon. Polyp was snared, then cauterized with monopolar cautery. Retrieval was successful.  Two sessile polyp 2 mm and 5 mm polyps were found in the sigmoid colon. Polyps were snared without cautery. Retrieval was successful.  Mild diverticulosis was found ascending colon and sigmoid colon.   Retroflexed views in the rectum revealed internal hemorrhoids.    The scope was then withdrawn  from the cecum and the procedure completed.  COMPLICATIONS:  None ENDOSCOPIC IMPRESSION: 1) Pedunculated polyp in the ascending colon. Removed and sent to pathology. 2) Two polyps in the sigmoid colon. Removed and sent to pathology. 3) Mild diverticulosis ascending colon and sigmoid colon 4) Small Internal hemorrhoids  RECOMMENDATIONS: 1) Await pathology results 2) High fiber diet. 3) If the polyps  removed today are proven to be adenomatous (pre-cancerous) polyps, you will need a colonoscopy in 3 years. Otherwise you should continue to follow colorectal cancer screening guidelines for "routine risk" patients with a colonoscopy in 10 years. You will receive a letter within 1-2 weeks with the results of your biopsy as well as final recommendations. Please call my office if you have not received a letter after 3 weeks. 4) Await pathology.  If biopsies from EGD are negative, then consider video capsule study given IDA.  Carie Caddy. Rhea Belton, MD  CC:  Jethro Bolus MD Sheliah Hatch, MD The Patient  n. Rosalie DoctorCarie Caddy. Angelik Walls at 06/17/2011 11:49 AM  Nance Pear, [phone]

## 2011-06-17 NOTE — Progress Notes (Signed)
Patient did not experience any of the following events: a burn prior to discharge; a fall within the facility; wrong site/side/patient/procedure/implant event; or a hospital transfer or hospital admission upon discharge from the facility. (G8907) Patient did not have preoperative order for IV antibiotic SSI prophylaxis. (G8918)  

## 2011-06-17 NOTE — Op Note (Signed)
[facility] [address] [place], [place]  [address]  ENDOSCOPY PROCEDURE REPORT  PATIENT:  Arthur Woodard, Arthur Woodard  MR#:  [id number] BIRTHDATE:  02/05/1961, 51 yrs. old  GENDER:  male ENDOSCOPIST:  Carie Caddy. Linn Goetze, MD Referred by:  Neena Rhymes, M.D. PROCEDURE DATE:  06/17/2011 PROCEDURE:  EGD with biopsy, 43239 ASA CLASS:  Class III INDICATIONS:  iron deficiency anemia, history of portal HTN, cirrhosis MEDICATIONS:   MAC sedation, administered by CRNA, propofol (Diprivan) 500 mg IV TOPICAL ANESTHETIC:  none  DESCRIPTION OF PROCEDURE:   After the risks benefits and alternatives of the procedure were thoroughly explained, informed consent was obtained.  The [facility] [id number] endoscope was introduced through the mouth and advanced to the second portion of the duodenum, without limitations.  The instrument was slowly withdrawn as the mucosa was fully examined. <<PROCEDUREIMAGES>>  The esophagus and gastroesophageal junction were completely normal in appearance.  No esophageal or gastric varices seen. Mild gastritis was found antrum.  Biopsies of the antrum and body of the stomach were obtained and sent to pathology.  The duodenal bulb was normal in appearance, as was the postbulbar duodenum. Random biopsies were obtained and sent to pathology. Retroflexed views revealed no abnormalities.    The scope was then withdrawn from the patient and the procedure completed.  COMPLICATIONS:  None  ENDOSCOPIC IMPRESSION: 1) Normal esophagus 2) Mild gastritis in the antrum.  Multiple biopsies taken and sent to pathology. 3) Normal duodenum. Multiple biopsies taken and sent to pathology.  RECOMMENDATIONS: 1) Await pathology results 2) Continue current medications 3) Colonoscopy today.  Carie Caddy. Rhea Belton, MD  CC:  Sheliah Hatch, MD Jethro Bolus, MD The Patient  n. eSIGNED:   Carie Caddy. Kayliah Tindol at 06/17/2011 11:42 AM  Nance Pear, [phone]

## 2011-06-18 ENCOUNTER — Telehealth: Payer: Self-pay | Admitting: *Deleted

## 2011-06-18 NOTE — Telephone Encounter (Signed)
  Follow up Call-  Call back number 06/17/2011  Post procedure Call Back phone  # [phone] hm or [phone] cell  Permission to leave phone message Yes     Patient questions:  Do you have a fever, pain , or abdominal swelling? no Pain Score  0 *  Have you tolerated food without any problems? yes  Have you been able to return to your normal activities? yes  Do you have any questions about your discharge instructions: Diet   no Medications  no Follow up visit  no  Do you have questions or concerns about your Care? no  Actions: * If pain score is 4 or above: No action needed, pain <4.

## 2011-06-21 ENCOUNTER — Telehealth: Payer: Self-pay | Admitting: *Deleted

## 2011-06-21 MED ORDER — AMOXICILL-CLARITHRO-LANSOPRAZ PO MISC: Freq: Two times a day (BID) | ORAL | Status: DC

## 2011-06-21 NOTE — Telephone Encounter (Signed)
Informed pt of the + H.Pylori and the need to order Prev Pak; pt verified he has no allergies. Pt stated understanding.

## 2011-07-29 ENCOUNTER — Encounter: Payer: Self-pay | Admitting: Internal Medicine

## 2011-08-02 ENCOUNTER — Telehealth: Payer: Self-pay | Admitting: *Deleted

## 2011-08-02 ENCOUNTER — Other Ambulatory Visit (HOSPITAL_BASED_OUTPATIENT_CLINIC_OR_DEPARTMENT_OTHER): Payer: 59 | Admitting: Lab

## 2011-08-02 DIAGNOSIS — D696 Thrombocytopenia, unspecified: Secondary | ICD-10-CM

## 2011-08-02 DIAGNOSIS — K7689 Other specified diseases of liver: Secondary | ICD-10-CM

## 2011-08-02 DIAGNOSIS — K7460 Unspecified cirrhosis of liver: Secondary | ICD-10-CM

## 2011-08-02 DIAGNOSIS — D649 Anemia, unspecified: Secondary | ICD-10-CM

## 2011-08-02 LAB — CBC WITH DIFFERENTIAL/PLATELET
BASO%: 1.2 % (ref 0.0–2.0)
Eosinophils Absolute: 0.2 10*3/uL (ref 0.0–0.5)
LYMPH%: 44.4 % (ref 14.0–49.0)
MCHC: 34.4 g/dL (ref 32.0–36.0)
MONO#: 0.8 10*3/uL (ref 0.1–0.9)
NEUT#: 1.2 10*3/uL — ABNORMAL LOW (ref 1.5–6.5)
RBC: 3.83 10*6/uL — ABNORMAL LOW (ref 4.20–5.82)
RDW: 16.4 % — ABNORMAL HIGH (ref 11.0–14.6)
WBC: 4 10*3/uL (ref 4.0–10.3)
lymph#: 1.8 10*3/uL (ref 0.9–3.3)

## 2011-08-02 NOTE — Telephone Encounter (Signed)
Message copied by Wende Mott on Fri Aug 02, 2011  4:05 PM ------      Message from: Jethro Bolus T      Created: Fri Aug 02, 2011 10:14 AM       Please call pt.  His anemia has improved.  His plt is still low but stable.  This is from cirrhosis.  Continue observation from hematology standpoint.  He needs to f/u with his liver physician.  Thanks.

## 2011-08-02 NOTE — Telephone Encounter (Signed)
Left VM for pt to call back for lab results. 

## 2011-09-05 ENCOUNTER — Encounter: Payer: Self-pay | Admitting: Internal Medicine

## 2011-09-11 ENCOUNTER — Encounter: Payer: Self-pay | Admitting: Internal Medicine

## 2011-09-11 ENCOUNTER — Other Ambulatory Visit (INDEPENDENT_AMBULATORY_CARE_PROVIDER_SITE_OTHER): Payer: 59

## 2011-09-11 ENCOUNTER — Ambulatory Visit (INDEPENDENT_AMBULATORY_CARE_PROVIDER_SITE_OTHER): Payer: 59 | Admitting: Internal Medicine

## 2011-09-11 VITALS — BP 148/78 | HR 100 | Ht 62.0 in | Wt 139.0 lb

## 2011-09-11 DIAGNOSIS — D509 Iron deficiency anemia, unspecified: Secondary | ICD-10-CM

## 2011-09-11 DIAGNOSIS — D126 Benign neoplasm of colon, unspecified: Secondary | ICD-10-CM

## 2011-09-11 DIAGNOSIS — K7460 Unspecified cirrhosis of liver: Secondary | ICD-10-CM

## 2011-09-11 DIAGNOSIS — A048 Other specified bacterial intestinal infections: Secondary | ICD-10-CM

## 2011-09-11 LAB — CBC WITH DIFFERENTIAL/PLATELET
Basophils Absolute: 0 10*3/uL (ref 0.0–0.1)
Lymphocytes Relative: 39.5 % (ref 12.0–46.0)
Lymphs Abs: 1.5 10*3/uL (ref 0.7–4.0)
Monocytes Relative: 11.8 % (ref 3.0–12.0)
Platelets: 42 10*3/uL — CL (ref 150.0–400.0)
RDW: 19.8 % — ABNORMAL HIGH (ref 11.5–14.6)

## 2011-09-11 LAB — COMPREHENSIVE METABOLIC PANEL
ALT: 216 U/L — ABNORMAL HIGH (ref 0–53)
AST: 301 U/L — ABNORMAL HIGH (ref 0–37)
Alkaline Phosphatase: 273 U/L — ABNORMAL HIGH (ref 39–117)
Calcium: 8.5 mg/dL (ref 8.4–10.5)
Chloride: 107 mEq/L (ref 96–112)
Creatinine, Ser: 0.7 mg/dL (ref 0.4–1.5)
Total Bilirubin: 4.1 mg/dL — ABNORMAL HIGH (ref 0.3–1.2)

## 2011-09-11 LAB — IBC PANEL: Iron: 213 ug/dL — ABNORMAL HIGH (ref 42–165)

## 2011-09-11 LAB — FERRITIN: Ferritin: 493.5 ng/mL — ABNORMAL HIGH (ref 22.0–322.0)

## 2011-09-11 NOTE — Progress Notes (Signed)
Subjective:    Patient ID: Arthur Woodard, male    DOB: May 09, 1960, 51 y.o.   MRN: [id number]  HPI Arthur Woodard is a 51 yo male with PMH of HTN, DM, cirrhosis felt alcohol induced with thrombocytopenia, iron def anemia, H pylori infection, and adenomatous colon polyps who seen for followup. He is alone today. He was initially seen in April 2013 evaluation of iron deficiency anemia. He had an upper endoscopy and colonoscopy on 06/17/2011. The upper endoscopy revealed gastritis biopsies proved severe Helicobacter pylori infection and he was treated with triple therapy for 2 weeks time. He reports he completed this without difficulty. Duodenal biopsies were normal without evidence of celiac disease. His colonoscopy revealed 3 tubular adenomas, mild diverticulosis, and small internal hemorrhoids. He returns today stating he is doing very well and is without GI complaint. He denies abdominal pain. No heartburn. No trouble swallowing. No nausea or vomiting. Appetite is good. No early satiety. Bowel habits are normal for him without diarrhea or constipation. No Arthur Woodard red blood per rectum or melena. He does still drink on occasion, but overall he has cut his alcohol use back. He reports there have been stressors in his life recently and this has led to periods of alcohol use. His mother-in-law recently died, and this has required his wife to return to their native country of [place].    Review of Systems As per history of present illness, otherwise negative  Current Medications, Allergies, Past Medical History, Past Surgical History, Family History and Social History were reviewed in [employer] record.     Objective:   Physical Exam BP 148/78  Pulse 100  Ht 5\' 2"  (1.575 m)  Wt 139 lb (63.05 kg)  BMI 25.42 kg/m2 Constitutional: Well-developed and well-nourished. No distress.  HEENT: Normocephalic and atraumatic. Oropharynx is clear and moist. No oropharyngeal exudate. Conjunctivae  are normal. No scleral icterus.  Neck: Neck supple. Trachea midline.  Cardiovascular: Normal rate, regular rhythm and intact distal pulses. No M/R/G  Pulmonary/chest: Effort normal and breath sounds normal. No wheezing, rales or rhonchi.  Abdominal: Soft, nontender, nondistended. Bowel sounds active throughout. There are no masses palpable. Liver is mildly enlarged 2 cm below right costal margin,  no splenomegaly. No appreciable ascites  Extremities: no clubbing, cyanosis, or edema  Lymphadenopathy: No cervical adenopathy noted.  Neurological: Alert and oriented to person place and time.  Skin: Skin is warm and dry. No rashes noted.  Psychiatric: Normal mood and affect. Behavior is normal.  CBC    Component Value Date/Time   WBC 4.0 08/02/2011 0922   WBC 10.2 07/05/2010 0805   RBC 3.83* 08/02/2011 0922   RBC 3.62* 07/05/2010 0805   HGB 12.7* 08/02/2011 0922   HGB 12.0* 07/05/2010 0805   HCT 36.9* 08/02/2011 0922   HCT 34.5* 07/05/2010 0805   PLT 51* 08/02/2011 0922   PLT 63 SPECIMEN CHECKED FOR CLOTS REPEATED TO VERIFY PLATELET COUNT CONFIRMED BY SMEAR LARGE PLATELETS PRESENT* 07/05/2010 0805   MCV 96.3 08/02/2011 0922   MCV 95.3 07/05/2010 0805   MCH 33.1 08/02/2011 0922   MCH 33.1 07/05/2010 0805   MCHC 34.4 08/02/2011 0922   MCHC 34.8 07/05/2010 0805   RDW 16.4* 08/02/2011 0922   RDW 16.9* 07/05/2010 0805   LYMPHSABS 1.8 08/02/2011 0922   LYMPHSABS 3.4 07/05/2010 0805   MONOABS 0.8 08/02/2011 0922   MONOABS 1.4* 07/05/2010 0805   EOSABS 0.2 08/02/2011 0922   EOSABS 0.1 07/05/2010 0805   BASOSABS 0.0  08/02/2011 0922   BASOSABS 0.0 07/05/2010 0805      Assessment & Plan:  51 yo male with PMH of HTN, DM, cirrhosis felt alcohol induced with thrombocytopenia, iron def anemia, H pylori infection, and adenomatous colon polyps who seen for followup.  1. IDA with recent treatment for H. pylori gastritis -- his H. pylori infection could of been partially or completely responsible for his iron deficiency  anemia. No other source was found to explain his iron deficiency on upper or lower endoscopy. He has successfully completed H. Pylori treatment, and I will order a stool antigen today to ensure eradication. I also will repeat iron studies and a CBC today.  2. Cirrhosis, alcohol -- he is still drinking some alcohol, and I have reminded him the importance of complete cessation given his cirrhosis. He understands this recommendation and reports he will work to avoid it completely. Currently his cirrhosis seems well compensated, but I would like to repeat CMP today. There is no evidence of esophageal varices at his endoscopy. He will be due repeat variceal screening in 3 years, unless decompensation occurs prior to this.  3. 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 This is scheduled for 10/28/2011.  4.  History of adenomatous colon polyp -- it 3 polyps removed and based on nationally recognized guidelines repeat interval is 3 years. I have recognized an error I made in his pathology letter stating he needs repeat in 5 years. We have discussed this today and he is aware of my recommendation to repeat the colonoscopy in 3 years. We will place a reminder for April 2016.

## 2011-09-11 NOTE — Patient Instructions (Addendum)
Your physician has requested that you go to the basement for lab work before leaving today.  Alcohol Problems Most adults who drink alcohol drink in moderation (not a lot) are at low risk for developing problems related to their drinking. However, all drinkers, including low-risk drinkers, should know about the health risks connected with drinking alcohol. RECOMMENDATIONS FOR LOW-RISK DRINKING  Drink in moderation. Moderate drinking is defined as follows:   Men - no more than 2 drinks per day.   Nonpregnant women - no more than 1 drink per day.   Over age 78 - no more than 1 drink per day.  A standard drink is 12 grams of pure alcohol, which is equal to a 12 ounce bottle of beer or wine cooler, a 5 ounce glass of wine, or 1.5 ounces of distilled spirits (such as whiskey, brandy, vodka, or rum).  ABSTAIN FROM (DO NOT DRINK) ALCOHOL:  When pregnant or considering pregnancy.   When taking a medication that interacts with alcohol.   If you are alcohol dependent.   A medical condition that prohibits drinking alcohol (such as ulcer, liver disease, or heart disease).  DISCUSS WITH YOUR CAREGIVER:  If you are at risk for coronary heart disease, discuss the potential benefits and risks of alcohol use: Light to moderate drinking is associated with lower rates of coronary heart disease in certain populations (for example, men over age 7 and postmenopausal women). Infrequent or nondrinkers are advised not to begin light to moderate drinking to reduce the risk of coronary heart disease so as to avoid creating an alcohol-related problem. Similar protective effects can likely be gained through proper diet and exercise.   Women and the elderly have smaller amounts of body water than men. As a result women and the elderly achieve a higher blood alcohol concentration after drinking the same amount of alcohol.   Exposing a fetus to alcohol can cause a broad range of birth defects referred to as Fetal  Alcohol Syndrome (FAS) or Alcohol-Related Birth Defects (ARBD). Although FAS/ARBD is connected with excessive alcohol consumption during pregnancy, studies also have reported neurobehavioral problems in infants born to mothers reporting drinking an average of 1 drink per day during pregnancy.   Heavier drinking (the consumption of more than 4 drinks per occasion by men and more than 3 drinks per occasion by women) impairs learning (cognitive) and psychomotor functions and increases the risk of alcohol-related problems, including accidents and injuries.  CAGE QUESTIONS:   Have you ever felt that you should Cut down on your drinking?   Have people Annoyed you by criticizing your drinking?   Have you ever felt bad or Guilty about your drinking?   Have you ever had a drink first thing in the morning to steady your nerves or get rid of a hangover (Eye opener)?  If you answered positively to any of these questions: You may be at risk for alcohol-related problems if alcohol consumption is:   Men: Greater than 14 drinks per week or more than 4 drinks per occasion.   Women: Greater than 7 drinks per week or more than 3 drinks per occasion.  Do you or your family have a medical history of alcohol-related problems, such as:  Blackouts.   Sexual dysfunction.   Depression.   Trauma.   Liver dysfunction.   Sleep disorders.   Hypertension.   Chronic abdominal pain.   Has your drinking ever caused you problems, such as problems with your family, problems with your work (  or school) performance, or accidents/injuries?   Do you have a compulsion to drink or a preoccupation with drinking?   Do you have poor control or are you unable to stop drinking once you have started?   Do you have to drink to avoid withdrawal symptoms?   Do you have problems with withdrawal such as tremors, nausea, sweats, or mood disturbances?   Does it take more alcohol than in the past to get you high?   Do you  feel a strong urge to drink?   Do you change your plans so that you can have a drink?   Do you ever drink in the morning to relieve the shakes or a hangover?  If you have answered a number of the previous questions positively, it may be time for you to talk to your caregivers, family, and friends and see if they think you have a problem. Alcoholism is a chemical dependency that keeps getting worse and will eventually destroy your health and relationships. Many alcoholics end up dead, impoverished, or in prison. This is often the end result of all chemical dependency.  Do not be discouraged if you are not ready to take action immediately.   Decisions to change behavior often involve up and down desires to change and feeling like you cannot decide.   Try to think more seriously about your drinking behavior.   Think of the reasons to quit.  WHERE TO GO FOR ADDITIONAL INFORMATION   The National Institute on Alcohol Abuse and Alcoholism (NIAAA)www.niaaa.nih.gov   [employer] on Alcoholism and Drug Dependence (NCADD)www.ncadd.org   American Society of Addiction Medicine (ASAM)www.https://anderson-johnson.com/  Document Released: 02/04/2005 Document Revised: 01/24/2011 Document Reviewed: 09/23/2007 [facility] Patient Information 2012 [name], [name].:

## 2011-09-18 LAB — HELICOBACTER PYLORI  SPECIAL ANTIGEN: H. PYLORI Antigen: NEGATIVE

## 2011-09-19 ENCOUNTER — Other Ambulatory Visit: Payer: Self-pay | Admitting: Internal Medicine

## 2011-09-19 DIAGNOSIS — R7989 Other specified abnormal findings of blood chemistry: Secondary | ICD-10-CM

## 2011-09-27 ENCOUNTER — Other Ambulatory Visit (INDEPENDENT_AMBULATORY_CARE_PROVIDER_SITE_OTHER): Payer: 59

## 2011-09-27 DIAGNOSIS — R7989 Other specified abnormal findings of blood chemistry: Secondary | ICD-10-CM

## 2011-09-27 LAB — PROTIME-INR: INR: 1.6 ratio — ABNORMAL HIGH (ref 0.8–1.0)

## 2011-09-27 LAB — HEPATIC FUNCTION PANEL
Bilirubin, Direct: 1.2 mg/dL — ABNORMAL HIGH (ref 0.0–0.3)
Total Bilirubin: 3 mg/dL — ABNORMAL HIGH (ref 0.3–1.2)

## 2011-10-01 ENCOUNTER — Other Ambulatory Visit: Payer: Self-pay | Admitting: Internal Medicine

## 2011-10-01 DIAGNOSIS — K7460 Unspecified cirrhosis of liver: Secondary | ICD-10-CM

## 2011-10-15 ENCOUNTER — Telehealth: Payer: Self-pay | Admitting: *Deleted

## 2011-10-15 NOTE — Telephone Encounter (Signed)
Message copied by Florene Glen on Tue Oct 15, 2011  1:08 PM ------      Message from: [place], [place] R      Created: Tue Oct 01, 2011 10:17 AM      Regarding: Labs       Pt needs CMP and INR in 2 weeks

## 2011-10-15 NOTE — Telephone Encounter (Signed)
Called to remind pt of repeat labs; pt will come on 10/18/11.

## 2011-10-18 ENCOUNTER — Other Ambulatory Visit (INDEPENDENT_AMBULATORY_CARE_PROVIDER_SITE_OTHER): Payer: 59

## 2011-10-18 DIAGNOSIS — K7460 Unspecified cirrhosis of liver: Secondary | ICD-10-CM

## 2011-10-18 LAB — COMPREHENSIVE METABOLIC PANEL
ALT: 87 U/L — ABNORMAL HIGH (ref 0–53)
CO2: 24 mEq/L (ref 19–32)
Calcium: 8.5 mg/dL (ref 8.4–10.5)
Chloride: 102 mEq/L (ref 96–112)
GFR: 113.21 mL/min (ref >60.00)
Sodium: 133 mEq/L — ABNORMAL LOW (ref 135–145)
Total Protein: 8.1 g/dL (ref 6.0–8.3)

## 2011-10-28 ENCOUNTER — Other Ambulatory Visit: Payer: 59

## 2011-11-04 ENCOUNTER — Ambulatory Visit
Admission: RE | Admit: 2011-11-04 | Discharge: 2011-11-04 | Disposition: A | Discharge status: Home / Self Care | Payer: 59 | Source: Ambulatory Visit | Attending: Internal Medicine | Admitting: Internal Medicine

## 2011-11-04 DIAGNOSIS — D509 Iron deficiency anemia, unspecified: Secondary | ICD-10-CM

## 2011-11-04 MED ORDER — GADOBENATE DIMEGLUMINE 529 MG/ML IV SOLN
12.0000 mL | Freq: Once | INTRAVENOUS | Status: AC | PRN
  Administered 2011-11-04: 12 mL via INTRAVENOUS

## 2011-11-15 ENCOUNTER — Encounter: Payer: Self-pay | Admitting: Oncology

## 2011-11-15 ENCOUNTER — Ambulatory Visit (HOSPITAL_BASED_OUTPATIENT_CLINIC_OR_DEPARTMENT_OTHER): Payer: 59 | Admitting: Oncology

## 2011-11-15 ENCOUNTER — Other Ambulatory Visit (HOSPITAL_BASED_OUTPATIENT_CLINIC_OR_DEPARTMENT_OTHER): Payer: 59 | Admitting: Lab

## 2011-11-15 VITALS — BP 147/97 | HR 64 | Temp 97.7°F | Resp 20 | Ht 62.0 in | Wt 144.2 lb

## 2011-11-15 DIAGNOSIS — D696 Thrombocytopenia, unspecified: Secondary | ICD-10-CM

## 2011-11-15 DIAGNOSIS — K7460 Unspecified cirrhosis of liver: Secondary | ICD-10-CM

## 2011-11-15 DIAGNOSIS — K7689 Other specified diseases of liver: Secondary | ICD-10-CM

## 2011-11-15 DIAGNOSIS — D649 Anemia, unspecified: Secondary | ICD-10-CM

## 2011-11-15 LAB — CBC WITH DIFFERENTIAL/PLATELET
Eosinophils Absolute: 0.2 10*3/uL (ref 0.0–0.5)
HCT: 36 % — ABNORMAL LOW (ref 38.4–49.9)
LYMPH%: 35.8 % (ref 14.0–49.0)
MCHC: 34.9 g/dL (ref 32.0–36.0)
MCV: 103.2 fL — ABNORMAL HIGH (ref 79.3–98.0)
MONO%: 16.3 % — ABNORMAL HIGH (ref 0.0–14.0)
NEUT#: 1.5 10*3/uL (ref 1.5–6.5)
NEUT%: 42 % (ref 39.0–75.0)
Platelets: 69 10*3/uL — ABNORMAL LOW (ref 140–400)
RBC: 3.49 10*6/uL — ABNORMAL LOW (ref 4.20–5.82)

## 2011-11-15 LAB — COMPREHENSIVE METABOLIC PANEL (CC13)
AST: 135 U/L — ABNORMAL HIGH (ref 5–34)
Alkaline Phosphatase: 342 U/L — ABNORMAL HIGH (ref 40–150)
BUN: 15 mg/dL (ref 7.0–26.0)
Glucose: 120 mg/dl — ABNORMAL HIGH (ref 70–99)
Sodium: 137 mEq/L (ref 136–145)
Total Bilirubin: 1.3 mg/dL — ABNORMAL HIGH (ref 0.20–1.20)
Total Protein: 7.2 g/dL (ref 6.4–8.3)

## 2011-11-15 LAB — MORPHOLOGY: PLT EST: DECREASED

## 2011-11-15 NOTE — Progress Notes (Signed)
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   PAST  TREATMENT:  He was first thought to have ITP and received Prednisone taper course starting on Jul 04, 2010 without response.  He received 4 weekly doses of Rituxan between 07/31/2010 and 08/21/2010 without response either.   CURRENT TREATMENT:  watchful observation.  INTERVAL HISTORY: Safi N San 51 y.o. male returns for regular follow up by himself.  He reported feeling well. He is now being followed by [facility] for his cirrhosis as it was too far to drive to [facility]. He had an upper endoscopy and colonoscopy earlier this year. He was treated for H pylori. His appetite is better and he has gained back some of the weight that he has lost. He states that he is checking his blood sugars once a week. Reported a recent fasting CBG of 129, but was not sure what previous CBGs have been. Denies bleeding. No chest pain, shortness of breath, abdominal pain, nausea, vomiting. No edema or ascites.  Patient denies fatigue, headache, visual changes, confusion, drenching night sweats, palpable lymph node swelling, mucositis, odynophagia, dysphagia, nausea vomiting, jaundice, chest pain, palpitation, shortness of breath, dyspnea on exertion, productive cough, gum bleeding, epistaxis, hematemesis, hemoptysis, abdominal pain, abdominal swelling, early satiety, melena, hematochezia, hematuria, skin rash, spontaneous bleeding, joint swelling, joint pain, heat or cold intolerance, bowel bladder incontinence, back pain, focal motor weakness, paresthesia, depression, suicidal or homocidal ideation, feeling hopelessness.  He has not noticed any blister in his mouth anymore.  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     Past Medical History  Diagnosis Date  . Cirrhosis   . Splenomegaly, congestive, chronic   . Thrombocytopenia   . Liver nodule  05/15/2011  . Allergy   . Anemia   . Diabetes mellitus   . Hypertension     Past Surgical History  Procedure Date  . Tooth extraction   . Bone marrow biopsy     Current Outpatient Prescriptions  Medication Sig Dispense Refill  . atorvastatin (LIPITOR) 20 MG tablet Take 20 mg by mouth Daily.      . cetirizine (ZYRTEC) 10 MG tablet Take 10 mg by mouth daily.      . fluticasone (VERAMYST) 27.5 MCG/SPRAY nasal spray Place 2 sprays into the nose daily.      Marland Kitchen glimepiride (AMARYL) 4 MG tablet Take 4 mg by mouth Daily.      Marland Kitchen lisinopril (PRINIVIL,ZESTRIL) 20 MG tablet Take 20 mg by mouth daily.        ALLERGIES:   has no known allergies.  REVIEW OF SYSTEMS:  The rest of the 14-point review of system was negative.   Filed Vitals:   11/15/11 1014  BP: 147/97  Pulse: 64  Temp: 97.7 F (36.5 C)  Resp: 20   Wt Readings from Last 3 Encounters:  11/15/11 144 lb 3.2 oz (65.409 kg)  09/11/11 139 lb (63.05 kg)  06/17/11 134 lb (60.782 kg)   ECOG Performance status: 0  PHYSICAL EXAMINATION:   General:  well-nourished in no acute distress.  Eyes:  no scleral icterus.  ENT:  There were no oropharyngeal lesions.  Neck was without thyromegaly.  Lymphatics:  Negative cervical, supraclavicular or axillary adenopathy.  Respiratory: lungs were clear bilaterally without wheezing or crackles.  Cardiovascular:  Regular rate and rhythm, S1/S2, without murmur, rub or gallop.  There was no pedal edema.  GI:  abdomen was soft, flat, nontender, nondistended.  I was able to feel the splenic edge about 5cm below costal margin.  Rectal exam with normal rectal tone with brown stool, Guaic negative.  Muscoloskeletal:  no spinal tenderness of palpation of vertebral spine.  Skin exam was without echymosis, petichae.  Neuro exam was nonfocal.  Patient was able to get on and off exam table without assistance.  Gait was normal.  Patient was alerted and oriented.  Attention was good.   Language was appropriate.  Mood  was normal without depression.  Speech was not pressured.  Thought content was not tangential.     LABORATORY/RADIOLOGY DATA:  Lab Results  Component Value Date   WBC 3.5* 11/15/2011   HGB 12.6* 11/15/2011   HCT 36.0* 11/15/2011   PLT 69* 11/15/2011   GLUCOSE 134* 10/18/2011   CHOL 162 02/03/2008   TRIG 218* 02/03/2008   HDL 21.9* 02/03/2008   LDLDIRECT 81.8 02/03/2008   LDLCALC 122* 04/24/2007   ALT 87* 10/18/2011   AST 134* 10/18/2011   NA 133* 10/18/2011   K 4.2 10/18/2011   CL 102 10/18/2011   CREATININE 0.9 10/18/2011   BUN 13 10/18/2011   CO2 24 10/18/2011   PSA 0.30 04/24/2007   INR 1.5* 10/18/2011    ASSESSMENT AND PLAN:    1.   Thrombocytopenia: most likely due to hepatic congestion.  His workup with Dr. Julieta Gutting last year showed evidence of hepatic insufficiency.  He also has exam finding of splenomegaly.  Bone marrow biopsy in the past was unrevealing.  His plt is stable now.  There is no active bleeding.  There is no need for transfusion.   2.  Anemia, normocytic:  Iron studies were slightly elevated in July 2013. Folate level normal. Vitamin B12 elevated. He had an EGD and colonoscopy. No source of bleeding noted. He was treated for H pylori. Hemoglobin is stable. No active bleeding. No transfusion indicated.  3.  Diabete Mellitus, type II:   He is now on Glimepiride per PCP.  4.  HTN:  Not at goal. On Lisinopril. He will follow-up with PCP for this. 5.  Cirrhosis:  Unclear etiology (? EtOH?). He is followed by [facility]. 6.  History of liver nodules on MRI liver in 06/2010. Recent MRI performed per Dr Rhea Belton. He will follow-up with him for the results next month. 7.  Follow up:  He is scheduled for lab in 3 months and a visit in 6 months.  The length of time of the face-to-face encounter was 15 minutes. More than 50% of time was spent counseling and coordination of care.

## 2011-11-26 ENCOUNTER — Encounter: Payer: Self-pay | Admitting: Internal Medicine

## 2011-11-27 ENCOUNTER — Encounter: Payer: Self-pay | Admitting: Internal Medicine

## 2011-11-27 ENCOUNTER — Other Ambulatory Visit: Payer: 59

## 2011-11-27 ENCOUNTER — Other Ambulatory Visit (INDEPENDENT_AMBULATORY_CARE_PROVIDER_SITE_OTHER): Payer: 59

## 2011-11-27 ENCOUNTER — Ambulatory Visit (INDEPENDENT_AMBULATORY_CARE_PROVIDER_SITE_OTHER): Payer: 59 | Admitting: Internal Medicine

## 2011-11-27 VITALS — BP 148/86 | HR 64 | Ht 62.0 in | Wt 142.6 lb

## 2011-11-27 DIAGNOSIS — D509 Iron deficiency anemia, unspecified: Secondary | ICD-10-CM

## 2011-11-27 DIAGNOSIS — K769 Liver disease, unspecified: Secondary | ICD-10-CM

## 2011-11-27 DIAGNOSIS — K7689 Other specified diseases of liver: Secondary | ICD-10-CM

## 2011-11-27 DIAGNOSIS — E611 Iron deficiency: Secondary | ICD-10-CM

## 2011-11-27 DIAGNOSIS — F1010 Alcohol abuse, uncomplicated: Secondary | ICD-10-CM

## 2011-11-27 DIAGNOSIS — K7460 Unspecified cirrhosis of liver: Secondary | ICD-10-CM

## 2011-11-27 LAB — HEPATIC FUNCTION PANEL
Albumin: 3.2 g/dL — ABNORMAL LOW (ref 3.5–5.2)
Alkaline Phosphatase: 267 U/L — ABNORMAL HIGH (ref 39–117)

## 2011-11-27 LAB — PROTIME-INR
INR: 1.8 ratio — ABNORMAL HIGH (ref 0.8–1.0)
Prothrombin Time: 19.2 s — ABNORMAL HIGH (ref 10.2–12.4)

## 2011-11-27 LAB — CBC WITH DIFFERENTIAL/PLATELET
Eosinophils Relative: 3.8 % (ref 0.0–5.0)
MCV: 101.9 fl — ABNORMAL HIGH (ref 78.0–100.0)
Monocytes Absolute: 0.5 10*3/uL (ref 0.1–1.0)
Neutrophils Relative %: 46.4 % (ref 43.0–77.0)
Platelets: 67 10*3/uL — ABNORMAL LOW (ref 150.0–400.0)
WBC: 4 10*3/uL — ABNORMAL LOW (ref 4.5–10.5)

## 2011-11-27 NOTE — Progress Notes (Addendum)
Subjective:    Patient ID: Arthur Woodard, male    DOB: 1960/12/14, 52 y.o.   MRN: [id number]  HPI Arthur Woodard is a 51 yo male with PMH of alcoholic cirrhosis with thrombocytopenia with liver lesions of uncertain nature, HTN, DM, hx of iron def anemia, H pylori infection s/p treatment, and adenomatous colon polyps who seen for followup. He is alone today. He recently had a repeat abdominal MRI to reevaluate the liver lesions. Overall, he reports he is feeling well. He denies abdominal pain. No nausea or vomiting. He denies increased abdominal girth or lower extremity edema. He denies jaundice. No itching. He does continue drink alcohol, but notes he has significantly reduced his daily intake.  He is no longer drinking every day, and only drinking maybe one to 2 days per week. When he does drink he reports drinking 212 ounce beers. He has attended AA meetings in the past, but is not currently doing this.  He states desire to completely stop drinking and reports making efforts to do so. He is working full-time in a [employer].  He denies change in his bowel habits including no melena or rectal bleeding.  Review of Systems As per history of present illness, otherwise negative  Current Medications, Allergies, Past Medical History, Past Surgical History, Family History and Social History were reviewed in [employer] record.     Objective:   Physical Exam BP 148/86  Pulse 64  Ht 5\' 2"  (1.575 m)  Wt 142 lb 9.6 oz (64.683 kg)  BMI 26.08 kg/m2 Constitutional: Well-developed and well-nourished. No distress.  HEENT: Normocephalic and atraumatic. Oropharynx is clear and moist. No oropharyngeal exudate. Conjunctivae are normal. No scleral icterus.  Neck: Neck supple. Trachea midline.  Cardiovascular: Normal rate, regular rhythm and intact distal pulses. No M/R/G  Pulmonary/chest: Effort normal and breath sounds normal. No wheezing, rales or rhonchi.  Abdominal: Soft,  nontender, nondistended. Bowel sounds active throughout. There are no masses palpable. Liver is mildly enlarged 2 cm below right costal margin, no splenomegaly. No appreciable ascites  Extremities: no clubbing, cyanosis, or edema  Lymphadenopathy: No cervical adenopathy noted.  Neurological: Alert and oriented to person place and time.  Skin: Skin is warm and dry. No rashes noted.  Psychiatric: Normal mood and affect. Behavior is normal.  CMP     Component Value Date/Time   NA 137 11/15/2011 0926   NA 133* 10/18/2011 0945   K 4.5 11/15/2011 0926   K 4.2 10/18/2011 0945   CL 106 11/15/2011 0926   CL 102 10/18/2011 0945   CO2 22 11/15/2011 0926   CO2 24 10/18/2011 0945   GLUCOSE 120* 11/15/2011 0926   GLUCOSE 134* 10/18/2011 0945   GLUCOSE 116* 02/10/2006 1102   BUN 15.0 11/15/2011 0926   BUN 13 10/18/2011 0945   CREATININE 0.9 11/15/2011 0926   CREATININE 0.9 10/18/2011 0945   CALCIUM 8.4 11/15/2011 0926   CALCIUM 8.5 10/18/2011 0945   PROT 7.2 11/15/2011 0926   PROT 8.1 10/18/2011 0945   ALBUMIN 2.7* 11/15/2011 0926   ALBUMIN 2.9* 10/18/2011 0945   AST 135* 11/15/2011 0926   AST 134* 10/18/2011 0945   ALT 92* 11/15/2011 0926   ALT 87* 10/18/2011 0945   ALKPHOS 342* 11/15/2011 0926   ALKPHOS 257* 10/18/2011 0945   BILITOT 1.30* 11/15/2011 0926   BILITOT 2.1* 10/18/2011 0945   GFRNONAA 97 02/03/2008 0923   GFRAA 117 02/03/2008 0923    CBC    Component Value  Date/Time   WBC 3.5* 11/15/2011 0926   WBC 3.7* 09/11/2011 0946   RBC 3.49* 11/15/2011 0926   RBC 3.55* 09/11/2011 0946   HGB 12.6* 11/15/2011 0926   HGB 12.6* 09/11/2011 0946   HCT 36.0* 11/15/2011 0926   HCT 36.2* 09/11/2011 0946   PLT 69* 11/15/2011 0926   PLT 42.0 cL* 09/11/2011 0946   MCV 103.2* 11/15/2011 0926   MCV 101.9* 09/11/2011 0946   MCH 36.0* 11/15/2011 0926   MCH 33.1 07/05/2010 0805   MCHC 34.9 11/15/2011 0926   MCHC 34.7 09/11/2011 0946   RDW 13.6 11/15/2011 0926   RDW 19.8* 09/11/2011 0946   LYMPHSABS 1.2 11/15/2011 0926    LYMPHSABS 1.5 09/11/2011 0946   MONOABS 0.6 11/15/2011 0926   MONOABS 0.4 09/11/2011 0946   EOSABS 0.2 11/15/2011 0926   EOSABS 0.0 09/11/2011 0946   BASOSABS 0.0 11/15/2011 0926   BASOSABS 0.0 09/11/2011 0946   MRI ABDOMEN WITH AND WITHOUT CONTRAST -- 11/21/2011   Technique:  Multiplanar multisequence MR imaging of the abdomen was performed both before and after administration of intravenous contrast.   Contrast: 12mL MULTIHANCE GADOBENATE DIMEGLUMINE 529 MG/ML IV SOLN   Comparison: 05/21/2011   Findings: At least four hyperenhancing nodules are present, as follows: --10 x 8 mm nodule in the anterior right hepatic dome (series 13/image 20) --16 x 9 mm lesion along the subcapsular right hepatic lobe (series 13/image 28) --13 x 8 mm nodule in the medial segment left hepatic lobe (series 13/image 41) --13 x 9 mm nodule in the anterior segment right hepatic lobe (series 13/image 42)   Of note, the lesion in the medial segment left hepatic lobe is notable for possible restricted diffusion (series 4/image 60).   No definite washout on delayed phase images.   A few of these lesions may be mildly more prominent than on the prior study, although exact comparison is difficult due to motion and differences in technique/phase of enhancement.   Mildly nodular hepatic contour, compatible with cirrhosis.  No hepatic steatosis.   Spleen, pancreas, and adrenal glands are within normal limits.   Gallbladder is unremarkable.  No intrahepatic or extrahepatic ductal dilatation.   Kidneys are within normal limits.   No abdominal ascites.   No suspicious abdominal lymphadenopathy.   No focal osseous lesions.   IMPRESSION: Suspected cirrhosis.   13 x 8 mm nodule in the medial segment left hepatic lobe with possible restricted diffusion.  While worrisome, this does not meet the AASLD guidelines for diagnosis of hepatocellular carcinoma. Correlate with serum AFP.   At least three  additional hypoenhancing nodules, as described above, possibly reflecting dysplastic nodules.       Assessment & Plan:  50 yo male with PMH of alcoholic cirrhosis with thrombocytopenia with liver lesions of uncertain nature, HTN, DM, hx of iron def anemia, H pylori infection s/p treatment, and adenomatous colon polyps who seen for followup  1. Alcoholic cirrhosis/liver nodules -- I continue to be concerned with the liver lesions as described above in his recent MRI. I will order an AFP today for correlation. We discussed how these very well may represent HCC and require treatment. I recommended a liver biopsy, but he refuses this today. He reports being scared to undergo biopsy, and we have discussed the risks and benefits and the fact that I think he would tolerate this well. He still desires not to have this performed, but agrees to repeat MRI or comparison in April 2014 which will be  6 months from the last scan.  We have again discussed his alcohol use and I have strongly urged complete cessation.  I do not think he would be at risk for withdrawal given his intermittent use now. I've encouraged he resume his AA meetings. We have discussed his cirrhosis, possible HCC, and that liver transplantation can be used for [facility], however not in a person actively drinking. I made him aware that he would need at least 6 months of documented and complete alcohol abstinence before he could even be considered for transplant.   --At present his liver disease appears well compensated without evidence for jaundice, ascites or volume overload. --He is up-to-date on variceal screening with his last EGD April 2013  2.  History of IDA with successful H. pylori treatment -- being followed by hematology and hemoglobin is stable. Stool is heme negative a recent check in hematology clinic.    3.  History of adenomatous colon polyps -- repeat colonoscopy due April 2016  Followup 3 months with liver labs   Addendum:  AFP  returned slightly elevated at 18 I discussed the overall case by e-mail with Dr. Gaylyn Rong, who feels that repeating the MRI in 4-6 months and trending AFP over time is reasonable.  He recommends referral to IR for ablation or general surgery for resection if the MRI shows increasing tumor size or increasing AFP. I agree, and given patient's reluctance for biopsy at present, we'll plan repeat MRI and AFP in April 2013

## 2011-11-27 NOTE — Patient Instructions (Addendum)
Your physician has requested that you go to the basement for the following lab work before leaving today: AFP  You will have labs drawn again in late December:  INR, CBC, Hepatic Function panel  Follow up with dr. Rhea Belton in January 2014  Dr. Rhea Belton highly recommends you start actively attending AA meetings.   [facility] AA Intergroup Office [address] [place], [place] [address] Tel: [phone]    How Much is Too Much Alcohol? Drinking too much alcohol can cause injury, accidents, and health problems. These types of problems can include:   Car crashes.  Falls.  Family fighting (domestic violence).  Drowning.  Fights.  Injuries.  Burns.  Damage to certain organs.  Having a baby with birth defects. ONE DRINK CAN BE TOO MUCH WHEN YOU ARE:  Working.  Pregnant or breastfeeding.  Taking medicines. Ask your doctor.  Driving or planning to drive. WHAT IS A STANDARD DRINK?   1 regular beer (12 ounces or 360 milliliters).  1 glass of wine (5 ounces or 150 milliliters).  1 shot of liquor (1.5 ounces or 45 milliliters). BLOOD ALCOHOL LEVELS   .00 A person is sober.  Marland Kitchen03 A person has no trouble keeping balance, talking, or seeing right, but a "buzz" may be felt.  Marland Kitchen05 A person feels "buzzed" and relaxed.  Marland Kitchen08 or .10  A person is drunk. He or she has trouble talking, seeing right, and keeping his or her balance.  .15 A person loses body control and may pass out (blackout).  .20 A person has trouble walking (staggering) and throws up (vomits).  .30 A person will pass out (unconscious).  .40+ A person will be in a coma. Death is possible. If you or someone you know has a drinking problem, get help from a doctor.  Document Released: 12/01/2008 Document Revised: 04/29/2011 Document Reviewed: 12/01/2008 [facility] Patient Information 2013 [place], [name].

## 2011-11-28 ENCOUNTER — Other Ambulatory Visit: Payer: Self-pay | Admitting: *Deleted

## 2011-11-28 DIAGNOSIS — R945 Abnormal results of liver function studies: Secondary | ICD-10-CM

## 2012-01-06 ENCOUNTER — Telehealth: Payer: Self-pay | Admitting: *Deleted

## 2012-01-06 NOTE — Telephone Encounter (Signed)
Informed pt he needs a 3 month f/u; pt is scheduled for 01/29/12.

## 2012-01-06 NOTE — Telephone Encounter (Signed)
Message copied by Florene Glen on Mon Jan 06, 2012  8:58 AM ------      Message from: Daphine Deutscher      Created: Thu Nov 28, 2011  3:12 PM       Call patient and schedule 3 month OV f/u with [facility] 02/2012.

## 2012-01-23 ENCOUNTER — Encounter: Payer: Self-pay | Admitting: Internal Medicine

## 2012-01-23 ENCOUNTER — Telehealth: Payer: Self-pay | Admitting: Gastroenterology

## 2012-01-23 NOTE — Telephone Encounter (Signed)
Opened on accident disregard

## 2012-01-29 ENCOUNTER — Ambulatory Visit: Payer: 59 | Admitting: Internal Medicine

## 2012-02-07 ENCOUNTER — Ambulatory Visit: Payer: 59 | Admitting: Internal Medicine

## 2012-02-14 ENCOUNTER — Other Ambulatory Visit: Payer: 59 | Admitting: Lab

## 2012-03-05 ENCOUNTER — Encounter: Payer: Self-pay | Admitting: Internal Medicine

## 2012-03-10 ENCOUNTER — Ambulatory Visit: Payer: 59 | Admitting: Internal Medicine

## 2012-03-12 ENCOUNTER — Other Ambulatory Visit: Payer: Self-pay | Admitting: Gastroenterology

## 2012-03-12 DIAGNOSIS — F1010 Alcohol abuse, uncomplicated: Secondary | ICD-10-CM

## 2012-03-12 DIAGNOSIS — K7460 Unspecified cirrhosis of liver: Secondary | ICD-10-CM

## 2012-03-18 ENCOUNTER — Encounter: Payer: Self-pay | Admitting: Internal Medicine

## 2012-03-18 ENCOUNTER — Other Ambulatory Visit: Payer: Self-pay | Admitting: *Deleted

## 2012-03-24 ENCOUNTER — Other Ambulatory Visit (INDEPENDENT_AMBULATORY_CARE_PROVIDER_SITE_OTHER): Payer: 59

## 2012-03-24 ENCOUNTER — Encounter: Payer: Self-pay | Admitting: Internal Medicine

## 2012-03-24 ENCOUNTER — Ambulatory Visit (INDEPENDENT_AMBULATORY_CARE_PROVIDER_SITE_OTHER): Payer: 59 | Admitting: Internal Medicine

## 2012-03-24 VITALS — BP 140/80 | HR 66 | Ht 61.75 in | Wt 147.2 lb

## 2012-03-24 DIAGNOSIS — Z8639 Personal history of other endocrine, nutritional and metabolic disease: Secondary | ICD-10-CM

## 2012-03-24 DIAGNOSIS — K7030 Alcoholic cirrhosis of liver without ascites: Secondary | ICD-10-CM

## 2012-03-24 DIAGNOSIS — F1010 Alcohol abuse, uncomplicated: Secondary | ICD-10-CM

## 2012-03-24 DIAGNOSIS — K769 Liver disease, unspecified: Secondary | ICD-10-CM

## 2012-03-24 DIAGNOSIS — Z862 Personal history of diseases of the blood and blood-forming organs and certain disorders involving the immune mechanism: Secondary | ICD-10-CM

## 2012-03-24 DIAGNOSIS — R945 Abnormal results of liver function studies: Secondary | ICD-10-CM

## 2012-03-24 DIAGNOSIS — R160 Hepatomegaly, not elsewhere classified: Secondary | ICD-10-CM

## 2012-03-24 LAB — PROTIME-INR
INR: 1.9 ratio — ABNORMAL HIGH (ref 0.8–1.0)
Prothrombin Time: 20.2 s — ABNORMAL HIGH (ref 10.2–12.4)

## 2012-03-24 LAB — CBC WITH DIFFERENTIAL/PLATELET
Basophils Absolute: 0 10*3/uL (ref 0.0–0.1)
Eosinophils Relative: 2.9 % (ref 0.0–5.0)
HCT: 39 % (ref 39.0–52.0)
Lymphs Abs: 1.6 10*3/uL (ref 0.7–4.0)
MCV: 101 fl — ABNORMAL HIGH (ref 78.0–100.0)
Monocytes Absolute: 0.7 10*3/uL (ref 0.1–1.0)
Neutrophils Relative %: 42.3 % — ABNORMAL LOW (ref 43.0–77.0)
Platelets: 65 10*3/uL — ABNORMAL LOW (ref 150.0–400.0)
RDW: 16 % — ABNORMAL HIGH (ref 11.5–14.6)
WBC: 4.1 10*3/uL — ABNORMAL LOW (ref 4.5–10.5)

## 2012-03-24 LAB — HEPATIC FUNCTION PANEL
AST: 140 U/L — ABNORMAL HIGH (ref 0–37)
Albumin: 3.2 g/dL — ABNORMAL LOW (ref 3.5–5.2)
Alkaline Phosphatase: 297 U/L — ABNORMAL HIGH (ref 39–117)
Bilirubin, Direct: 0.7 mg/dL — ABNORMAL HIGH (ref 0.0–0.3)
Total Bilirubin: 2.2 mg/dL — ABNORMAL HIGH (ref 0.3–1.2)

## 2012-03-24 NOTE — Patient Instructions (Addendum)
Your physician has requested that you go to the basement for  lab work before leaving today.  You have been scheduled for an MRI of the Abdomin at [facility] on 05/25/2012 @ 10:00am Please arrive at 9:45.  Report to Radiology.  Nothing to eat or drink 4 hours prior to your scan.  If you cannot make this appointment or need to reschedule please call [phone]   If you have any questions please feel free to call me at [phone] Ext [phone]  Follow up with Dr. Rhea Belton in May

## 2012-03-24 NOTE — Progress Notes (Signed)
Patient ID: Arthur Woodard, male   DOB: [birth year], 52 y.o.   MRN: [id number]  SUBJECTIVE: HPI Mr. Gude is a 52 yo with PMH of alcoholic cirrhosis with thrombocytopenia and liver lesions of uncertain nature, HTN, DM, hx of iron def anemia, H pylori infection s/p treatment, and adenomatous colon polyps who seen for followup.  He is alone today. He is without specific complaint. No bowel pain. No nausea or vomiting. No ascites, lower extremity edema, jaundice, itching, GI bleeding or confusion.  He does continue to drink alcohol, though he reports he has reduced his intake. He is not drinking every day, but drinking multiple days per week. He last drank on Sunday approx 4 12 oz beers.  He understands the recommendation that he needs to stop drinking entirely due to his liver disease, and he is trying to do this. He reports he has been using on align classes to help with alcohol counseling, but is yet to attend an AA meeting.    Review of Systems  As per history of present illness, otherwise negative   Past Medical History  Diagnosis Date  . Cirrhosis   . Splenomegaly, congestive, chronic   . Thrombocytopenia   . Liver nodule 05/15/2011  . Allergy   . Anemia   . Diabetes mellitus   . Hypertension     Current Outpatient Prescriptions  Medication Sig Dispense Refill  . atorvastatin (LIPITOR) 20 MG tablet Take 20 mg by mouth Daily.      . cetirizine (ZYRTEC) 10 MG tablet Take 10 mg by mouth daily.      . fluticasone (VERAMYST) 27.5 MCG/SPRAY nasal spray Place 2 sprays into the nose daily.      Marland Kitchen glimepiride (AMARYL) 4 MG tablet Take 4 mg by mouth Daily.      Marland Kitchen lisinopril (PRINIVIL,ZESTRIL) 20 MG tablet Take 20 mg by mouth daily.        No Known Allergies  Family History  Problem Relation Age of Onset  . Colon cancer Neg Hx   . Esophageal cancer Neg Hx   . Rectal cancer Neg Hx   . Stomach cancer Neg Hx     History  Substance Use Topics  . Smoking status: Never Smoker   .  Smokeless tobacco: Never Used  . Alcohol Use: Yes     Comment: 2 beers/day on and off    OBJECTIVE: BP 140/80  Pulse 66  Ht 5' 1.75" (1.568 m)  Wt 147 lb 3.2 oz (66.769 kg)  BMI 27.14 kg/m2 Constitutional: Well-developed and well-nourished. No distress.  HEENT: Normocephalic and atraumatic. Oropharynx is clear and moist. No oropharyngeal exudate. Conjunctivae are normal. No scleral icterus, sclera muddy.  Neck: Neck supple. Trachea midline.  Cardiovascular: Normal rate, regular rhythm and intact distal pulses. No M/R/G  Pulmonary/chest: Effort normal and breath sounds normal. No wheezing, rales or rhonchi.  Abdominal: Soft, nontender, nondistended. Bowel sounds active throughout. There are no masses palpable. Liver barely palpable below right costal margin, no splenomegaly. No appreciable ascites  Extremities: no clubbing, cyanosis, or edema  Lymphadenopathy: No cervical adenopathy noted.  Neurological: Alert and oriented to person place and time, no asterixis Skin: Skin is warm and dry. No rashes noted.  Psychiatric: Normal mood and affect. Behavior is normal.  Labs and Imaging -- CMP     Component Value Date/Time   NA 137 11/15/2011 0926   NA 133* 10/18/2011 0945   K 4.5 11/15/2011 0926   K 4.2 10/18/2011 0945   CL  106 11/15/2011 0926   CL 102 10/18/2011 0945   CO2 22 11/15/2011 0926   CO2 24 10/18/2011 0945   GLUCOSE 120* 11/15/2011 0926   GLUCOSE 134* 10/18/2011 0945   GLUCOSE 116* 02/10/2006 1102   BUN 15.0 11/15/2011 0926   BUN 13 10/18/2011 0945   CREATININE 0.9 11/15/2011 0926   CREATININE 0.9 10/18/2011 0945   CALCIUM 8.4 11/15/2011 0926   CALCIUM 8.5 10/18/2011 0945   PROT 8.6* 11/27/2011 1103   PROT 7.2 11/15/2011 0926   ALBUMIN 3.2* 11/27/2011 1103   ALBUMIN 2.7* 11/15/2011 0926   AST 148* 11/27/2011 1103   AST 135* 11/15/2011 0926   ALT 107* 11/27/2011 1103   ALT 92* 11/15/2011 0926   ALKPHOS 267* 11/27/2011 1103   ALKPHOS 342* 11/15/2011 0926   BILITOT 2.5* 11/27/2011 1103    BILITOT 1.30* 11/15/2011 0926   GFRNONAA 97 02/03/2008 0923   GFRAA 117 02/03/2008 0923    Labs pending from today. MRI pending for April 2014  ASSESSMENT AND PLAN: 52 yo male with PMH of alcoholic cirrhosis with thrombocytopenia with liver lesions of uncertain nature, HTN, DM, hx of iron def anemia, H pylori infection s/p treatment, and adenomatous colon polyps who seen for followup   1. Alcoholic cirrhosis/liver nodules -- we have a plan to reimage the liver lesions with MRI in April, this test is scheduled.  We will repeat his liver enzymes today including AFP.  He is aware that these liver lesions could represent HCC, and understands this is why he recommended serial imaging. I have discussed liver biopsy with him again today, and he still does not wish to pursue this for diagnosis.  Also again talked about alcohol use and I have strongly urged that he stop altogether. It does seem that he has decreased his alcohol intake, but he needs to work further on abstinence. I have again recommended AA. At present his liver disease appears well compensated without evidence of portal hypertension.  I will repeat liver labs today.  --At present his liver disease appears well compensated without evidence for jaundice, ascites or volume overload.  --He is up-to-date on variceal screening with his last EGD April 2013  --labs today --MRI April 2014 --ETOH cessation counseling  2. History of IDA with successful H. pylori treatment -- being followed by hematology and hemoglobin is stable. Stool has been heme negative and heme clinic   3. History of adenomatous colon polyps -- repeat colonoscopy due April 2016   Followup in about 3 months

## 2012-05-13 NOTE — Patient Instructions (Signed)
1.  History of low platelet count due to cirrhosis. 2.  History of anemia of iron deficiency due to chronic GI blood loss. 3.  Follow up:  Regular blood check in 4 and 8 months.  Return visit in about 1 year.

## 2012-05-14 ENCOUNTER — Ambulatory Visit: Payer: 59 | Admitting: Oncology

## 2012-05-14 ENCOUNTER — Other Ambulatory Visit: Payer: 59 | Admitting: Lab

## 2012-05-14 NOTE — Progress Notes (Signed)
No show.  Discharged form clinic.  Discharge letter sent.

## 2012-05-25 ENCOUNTER — Ambulatory Visit (HOSPITAL_COMMUNITY)
Admission: RE | Admit: 2012-05-25 | Discharge: 2012-05-25 | Disposition: A | Discharge status: Home / Self Care | Payer: Managed Care, Other (non HMO) | Source: Ambulatory Visit | Attending: Internal Medicine | Admitting: Internal Medicine

## 2012-05-25 DIAGNOSIS — F1010 Alcohol abuse, uncomplicated: Secondary | ICD-10-CM

## 2012-05-25 DIAGNOSIS — K7460 Unspecified cirrhosis of liver: Secondary | ICD-10-CM

## 2012-06-30 ENCOUNTER — Inpatient Hospital Stay (HOSPITAL_COMMUNITY): Payer: Managed Care, Other (non HMO)

## 2012-06-30 ENCOUNTER — Inpatient Hospital Stay (HOSPITAL_COMMUNITY)
Admission: AD | Admit: 2012-06-30 | Discharge: 2012-07-07 | DRG: 682 | Disposition: A | Discharge status: Home / Self Care | Payer: Managed Care, Other (non HMO) | Attending: Internal Medicine | Admitting: Internal Medicine

## 2012-06-30 ENCOUNTER — Encounter (HOSPITAL_COMMUNITY): Payer: Self-pay | Admitting: Emergency Medicine

## 2012-06-30 ENCOUNTER — Emergency Department (HOSPITAL_COMMUNITY): Payer: Managed Care, Other (non HMO)

## 2012-06-30 DIAGNOSIS — I10 Essential (primary) hypertension: Secondary | ICD-10-CM

## 2012-06-30 DIAGNOSIS — N179 Acute kidney failure, unspecified: Secondary | ICD-10-CM | POA: Diagnosis present

## 2012-06-30 DIAGNOSIS — D696 Thrombocytopenia, unspecified: Secondary | ICD-10-CM

## 2012-06-30 DIAGNOSIS — D72829 Elevated white blood cell count, unspecified: Secondary | ICD-10-CM

## 2012-06-30 DIAGNOSIS — E785 Hyperlipidemia, unspecified: Secondary | ICD-10-CM | POA: Diagnosis present

## 2012-06-30 DIAGNOSIS — E1169 Type 2 diabetes mellitus with other specified complication: Secondary | ICD-10-CM | POA: Diagnosis present

## 2012-06-30 DIAGNOSIS — R188 Other ascites: Secondary | ICD-10-CM | POA: Diagnosis present

## 2012-06-30 DIAGNOSIS — F1010 Alcohol abuse, uncomplicated: Secondary | ICD-10-CM

## 2012-06-30 DIAGNOSIS — J189 Pneumonia, unspecified organism: Secondary | ICD-10-CM

## 2012-06-30 DIAGNOSIS — R0600 Dyspnea, unspecified: Secondary | ICD-10-CM | POA: Insufficient documentation

## 2012-06-30 DIAGNOSIS — K767 Hepatorenal syndrome: Secondary | ICD-10-CM | POA: Diagnosis present

## 2012-06-30 DIAGNOSIS — E871 Hypo-osmolality and hyponatremia: Secondary | ICD-10-CM | POA: Diagnosis present

## 2012-06-30 DIAGNOSIS — R933 Abnormal findings on diagnostic imaging of other parts of digestive tract: Secondary | ICD-10-CM

## 2012-06-30 DIAGNOSIS — D732 Chronic congestive splenomegaly: Secondary | ICD-10-CM

## 2012-06-30 DIAGNOSIS — K7689 Other specified diseases of liver: Secondary | ICD-10-CM

## 2012-06-30 DIAGNOSIS — D649 Anemia, unspecified: Secondary | ICD-10-CM

## 2012-06-30 DIAGNOSIS — R778 Other specified abnormalities of plasma proteins: Secondary | ICD-10-CM | POA: Diagnosis present

## 2012-06-30 DIAGNOSIS — R748 Abnormal levels of other serum enzymes: Secondary | ICD-10-CM | POA: Diagnosis present

## 2012-06-30 DIAGNOSIS — E162 Hypoglycemia, unspecified: Secondary | ICD-10-CM | POA: Insufficient documentation

## 2012-06-30 DIAGNOSIS — F1021 Alcohol dependence, in remission: Secondary | ICD-10-CM | POA: Diagnosis present

## 2012-06-30 DIAGNOSIS — Z860100 Personal history of colon polyps, unspecified: Secondary | ICD-10-CM

## 2012-06-30 DIAGNOSIS — D638 Anemia in other chronic diseases classified elsewhere: Secondary | ICD-10-CM | POA: Diagnosis present

## 2012-06-30 DIAGNOSIS — R601 Generalized edema: Secondary | ICD-10-CM

## 2012-06-30 DIAGNOSIS — R7309 Other abnormal glucose: Secondary | ICD-10-CM

## 2012-06-30 DIAGNOSIS — R059 Cough, unspecified: Secondary | ICD-10-CM | POA: Diagnosis present

## 2012-06-30 DIAGNOSIS — E875 Hyperkalemia: Secondary | ICD-10-CM

## 2012-06-30 DIAGNOSIS — R05 Cough: Secondary | ICD-10-CM | POA: Diagnosis present

## 2012-06-30 DIAGNOSIS — E8770 Fluid overload, unspecified: Secondary | ICD-10-CM

## 2012-06-30 DIAGNOSIS — I509 Heart failure, unspecified: Secondary | ICD-10-CM | POA: Diagnosis present

## 2012-06-30 DIAGNOSIS — D689 Coagulation defect, unspecified: Secondary | ICD-10-CM

## 2012-06-30 DIAGNOSIS — E872 Acidosis, unspecified: Secondary | ICD-10-CM | POA: Diagnosis present

## 2012-06-30 DIAGNOSIS — K7030 Alcoholic cirrhosis of liver without ascites: Secondary | ICD-10-CM | POA: Diagnosis present

## 2012-06-30 DIAGNOSIS — I5023 Acute on chronic systolic (congestive) heart failure: Secondary | ICD-10-CM | POA: Diagnosis present

## 2012-06-30 DIAGNOSIS — D126 Benign neoplasm of colon, unspecified: Secondary | ICD-10-CM

## 2012-06-30 DIAGNOSIS — J309 Allergic rhinitis, unspecified: Secondary | ICD-10-CM

## 2012-06-30 DIAGNOSIS — R7989 Other specified abnormal findings of blood chemistry: Secondary | ICD-10-CM

## 2012-06-30 DIAGNOSIS — Z79899 Other long term (current) drug therapy: Secondary | ICD-10-CM | POA: Diagnosis not present

## 2012-06-30 DIAGNOSIS — R945 Abnormal results of liver function studies: Secondary | ICD-10-CM

## 2012-06-30 DIAGNOSIS — E119 Type 2 diabetes mellitus without complications: Secondary | ICD-10-CM

## 2012-06-30 LAB — CBC WITH DIFFERENTIAL/PLATELET
Eosinophils Absolute: 0 10*3/uL (ref 0.0–0.7)
MCH: 34 pg (ref 26.0–34.0)
MCHC: 36.8 g/dL — ABNORMAL HIGH (ref 30.0–36.0)
Monocytes Absolute: 4.1 10*3/uL — ABNORMAL HIGH (ref 0.1–1.0)
Neutrophils Relative %: 77 % (ref 43–77)
Platelets: 114 10*3/uL — ABNORMAL LOW (ref 150–400)

## 2012-06-30 LAB — URINALYSIS, ROUTINE W REFLEX MICROSCOPIC
Glucose, UA: NEGATIVE mg/dL
Hgb urine dipstick: NEGATIVE
Leukocytes, UA: NEGATIVE
Nitrite: NEGATIVE
Specific Gravity, Urine: 1.021 (ref 1.005–1.030)
Specific Gravity, Urine: 1.018 (ref 1.005–1.030)
Urobilinogen, UA: 1 mg/dL (ref 0.0–1.0)
Urobilinogen, UA: 1 mg/dL (ref 0.0–1.0)

## 2012-06-30 LAB — POCT I-STAT, CHEM 8
Chloride: 100 mEq/L (ref 96–112)
Creatinine, Ser: 4.5 mg/dL — ABNORMAL HIGH (ref 0.50–1.35)
Glucose, Bld: 130 mg/dL — ABNORMAL HIGH (ref 70–99)
Hemoglobin: 11.2 g/dL — ABNORMAL LOW (ref 13.0–17.0)
Potassium: 7.5 mEq/L (ref 3.5–5.1)

## 2012-06-30 LAB — PROTIME-INR
INR: 2.75 — ABNORMAL HIGH (ref 0.00–1.49)
Prothrombin Time: 27.7 seconds — ABNORMAL HIGH (ref 11.6–15.2)

## 2012-06-30 LAB — URINE MICROSCOPIC-ADD ON

## 2012-06-30 LAB — BLOOD GAS, ARTERIAL
Acid-base deficit: 9.1 mmol/L — ABNORMAL HIGH (ref 0.0–2.0)
Drawn by: 295031
FIO2: 0.21 %
Patient temperature: 98.6
pCO2 arterial: 29.9 mmHg — ABNORMAL LOW (ref 35.0–45.0)

## 2012-06-30 LAB — GLUCOSE, CAPILLARY: Glucose-Capillary: 161 mg/dL — ABNORMAL HIGH (ref 70–99)

## 2012-06-30 LAB — RENAL FUNCTION PANEL
BUN: 66 mg/dL — ABNORMAL HIGH (ref 6–23)
CO2: 16 mEq/L — ABNORMAL LOW (ref 19–32)
Chloride: 92 mEq/L — ABNORMAL LOW (ref 96–112)
GFR calc Af Amer: 16 mL/min — ABNORMAL LOW (ref >90)
Glucose, Bld: 191 mg/dL — ABNORMAL HIGH (ref 70–99)
Potassium: 7.2 mEq/L (ref 3.5–5.1)
Sodium: 119 mEq/L — CL (ref 135–145)

## 2012-06-30 LAB — IRON AND TIBC
Iron: 84 ug/dL (ref 42–135)
Saturation Ratios: 69 % — ABNORMAL HIGH (ref 20–55)
TIBC: 121 ug/dL — ABNORMAL LOW (ref 215–435)
UIBC: 37 ug/dL — ABNORMAL LOW (ref 125–400)

## 2012-06-30 LAB — HEPATITIS B SURFACE ANTIGEN: Hepatitis B Surface Ag: NEGATIVE

## 2012-06-30 LAB — CK TOTAL AND CKMB (NOT AT ARMC)
CK, MB: 11.8 ng/mL (ref 0.3–4.0)
Relative Index: 2.1 (ref 0.0–2.5)
Total CK: 494 U/L — ABNORMAL HIGH (ref 7–232)

## 2012-06-30 LAB — POCT I-STAT TROPONIN I: Troponin i, poc: 0.38 ng/mL (ref 0.00–0.08)

## 2012-06-30 LAB — COMPREHENSIVE METABOLIC PANEL
AST: 325 U/L — ABNORMAL HIGH (ref 0–37)
Albumin: 1.5 g/dL — ABNORMAL LOW (ref 3.5–5.2)
CO2: 17 mEq/L — ABNORMAL LOW (ref 19–32)
Calcium: 8.5 mg/dL (ref 8.4–10.5)
Creatinine, Ser: 4.91 mg/dL — ABNORMAL HIGH (ref 0.50–1.35)
GFR calc non Af Amer: 12 mL/min — ABNORMAL LOW (ref >90)

## 2012-06-30 LAB — TROPONIN I: Troponin I: 1.64 ng/mL (ref <0.30)

## 2012-06-30 LAB — PRO B NATRIURETIC PEPTIDE: Pro B Natriuretic peptide (BNP): 1577 pg/mL — ABNORMAL HIGH (ref 0–125)

## 2012-06-30 LAB — HEMOGLOBIN A1C: Mean Plasma Glucose: 111 mg/dL (ref <117)

## 2012-06-30 LAB — HEPATITIS C ANTIBODY (REFLEX): HCV Ab: NEGATIVE

## 2012-06-30 LAB — FERRITIN: Ferritin: 1085 ng/mL — ABNORMAL HIGH (ref 22–322)

## 2012-06-30 MED ORDER — ALBUTEROL SULFATE (5 MG/ML) 0.5% IN NEBU: 2.5000 mg | INHALATION_SOLUTION | RESPIRATORY_TRACT | Status: DC | PRN

## 2012-06-30 MED ORDER — ACETAMINOPHEN 325 MG PO TABS: 650.0000 mg | ORAL_TABLET | Freq: Four times a day (QID) | ORAL | Status: DC | PRN

## 2012-06-30 MED ORDER — LORAZEPAM 2 MG/ML IJ SOLN: 1.0000 mg | Freq: Four times a day (QID) | INTRAMUSCULAR | Status: AC | PRN

## 2012-06-30 MED ORDER — DEXTROSE 5 % IV SOLN
1.0000 g | INTRAVENOUS | Status: DC
  Administered 2012-07-02 – 2012-07-04 (×3): 1 g via INTRAVENOUS
  Filled 2012-06-30 (×6): qty 10

## 2012-06-30 MED ORDER — GUAIFENESIN-DM 100-10 MG/5ML PO SYRP
5.0000 mL | ORAL_SOLUTION | ORAL | Status: DC | PRN
  Administered 2012-06-30: 5 mL via ORAL
  Filled 2012-06-30: qty 10
  Filled 2012-06-30: qty 5

## 2012-06-30 MED ORDER — SODIUM POLYSTYRENE SULFONATE 15 GM/60ML PO SUSP
45.0000 g | Freq: Once | ORAL | Status: AC
  Administered 2012-06-30: 45 g via ORAL

## 2012-06-30 MED ORDER — VITAMIN B-1 100 MG PO TABS
100.0000 mg | ORAL_TABLET | Freq: Every day | ORAL | Status: DC
  Administered 2012-06-30 – 2012-07-07 (×7): 100 mg via ORAL
  Filled 2012-06-30 (×8): qty 1

## 2012-06-30 MED ORDER — FOLIC ACID 1 MG PO TABS
1.0000 mg | ORAL_TABLET | Freq: Every day | ORAL | Status: DC
  Administered 2012-06-30 – 2012-07-07 (×7): 1 mg via ORAL
  Filled 2012-06-30 (×8): qty 1

## 2012-06-30 MED ORDER — SODIUM CHLORIDE 0.9 % IV SOLN
INTRAVENOUS | Status: DC
  Administered 2012-07-04: 20 mL/h via INTRAVENOUS
  Administered 2012-07-06: 08:00:00 via INTRAVENOUS

## 2012-06-30 MED ORDER — DEXTROSE 5 % IV SOLN
1.0000 g | Freq: Once | INTRAVENOUS | Status: AC
  Administered 2012-06-30: 1 g via INTRAVENOUS
  Filled 2012-06-30: qty 10

## 2012-06-30 MED ORDER — SODIUM BICARBONATE 650 MG PO TABS
1300.0000 mg | ORAL_TABLET | Freq: Three times a day (TID) | ORAL | Status: DC
  Administered 2012-06-30 – 2012-07-06 (×15): 1300 mg via ORAL
  Filled 2012-06-30 (×23): qty 2

## 2012-06-30 MED ORDER — SODIUM CHLORIDE 0.9 % IJ SOLN
3.0000 mL | Freq: Two times a day (BID) | INTRAMUSCULAR | Status: DC
  Administered 2012-06-30 – 2012-07-07 (×8): 3 mL via INTRAVENOUS

## 2012-06-30 MED ORDER — ONDANSETRON HCL 4 MG PO TABS: 4.0000 mg | ORAL_TABLET | Freq: Four times a day (QID) | ORAL | Status: DC | PRN

## 2012-06-30 MED ORDER — INSULIN ASPART 100 UNIT/ML ~~LOC~~ SOLN: 10.0000 [IU] | Freq: Once | SUBCUTANEOUS | Status: DC

## 2012-06-30 MED ORDER — SODIUM POLYSTYRENE SULFONATE 15 GM/60ML PO SUSP
30.0000 g | Freq: Once | ORAL | Status: AC
  Administered 2012-06-30: 30 g via ORAL
  Filled 2012-06-30: qty 60

## 2012-06-30 MED ORDER — OXYCODONE HCL 5 MG PO TABS: 5.0000 mg | ORAL_TABLET | Freq: Four times a day (QID) | ORAL | Status: DC | PRN

## 2012-06-30 MED ORDER — SODIUM POLYSTYRENE SULFONATE 15 GM/60ML PO SUSP
60.0000 g | Freq: Once | ORAL | Status: AC
  Administered 2012-06-30: 60 g via ORAL
  Filled 2012-06-30: qty 240

## 2012-06-30 MED ORDER — DEXTROSE 50 % IV SOLN
1.0000 | Freq: Once | INTRAVENOUS | Status: AC
  Administered 2012-06-30: 50 mL via INTRAVENOUS
  Filled 2012-06-30: qty 50

## 2012-06-30 MED ORDER — CALCIUM GLUCONATE 10 % IV SOLN
1.0000 g | Freq: Once | INTRAVENOUS | Status: AC
  Administered 2012-06-30: 1 g via INTRAVENOUS
  Filled 2012-06-30: qty 10

## 2012-06-30 MED ORDER — DEXTROSE 5 % IV SOLN
500.0000 mg | INTRAVENOUS | Status: DC
  Administered 2012-07-02 – 2012-07-03 (×2): 500 mg via INTRAVENOUS
  Filled 2012-06-30 (×4): qty 500

## 2012-06-30 MED ORDER — INSULIN ASPART 100 UNIT/ML IV SOLN: 10.0000 [IU] | Freq: Once | INTRAVENOUS | Status: DC

## 2012-06-30 MED ORDER — INSULIN ASPART 100 UNIT/ML ~~LOC~~ SOLN
10.0000 [IU] | Freq: Once | SUBCUTANEOUS | Status: AC
  Administered 2012-06-30: 10 [IU] via INTRAVENOUS
  Filled 2012-06-30: qty 1

## 2012-06-30 MED ORDER — DEXTROSE 5 % IV SOLN
500.0000 mg | Freq: Once | INTRAVENOUS | Status: AC
  Administered 2012-06-30: 500 mg via INTRAVENOUS
  Filled 2012-06-30: qty 500

## 2012-06-30 MED ORDER — ALBUTEROL SULFATE (5 MG/ML) 0.5% IN NEBU
5.0000 mg | INHALATION_SOLUTION | Freq: Once | RESPIRATORY_TRACT | Status: AC
  Administered 2012-06-30: 5 mg via RESPIRATORY_TRACT
  Filled 2012-06-30: qty 1

## 2012-06-30 MED ORDER — FUROSEMIDE 10 MG/ML IJ SOLN
60.0000 mg | Freq: Once | INTRAMUSCULAR | Status: AC
  Administered 2012-06-30: 60 mg via INTRAVENOUS
  Filled 2012-06-30: qty 8

## 2012-06-30 MED ORDER — CLOTRIMAZOLE 10 MG MT TROC
10.0000 mg | Freq: Every day | OROMUCOSAL | Status: DC
  Administered 2012-06-30 – 2012-07-07 (×30): 10 mg via ORAL
  Filled 2012-06-30 (×41): qty 1

## 2012-06-30 MED ORDER — ACETAMINOPHEN 650 MG RE SUPP: 650.0000 mg | Freq: Four times a day (QID) | RECTAL | Status: DC | PRN

## 2012-06-30 MED ORDER — THIAMINE HCL 100 MG/ML IJ SOLN
100.0000 mg | Freq: Every day | INTRAMUSCULAR | Status: DC
  Filled 2012-06-30 (×3): qty 1

## 2012-06-30 MED ORDER — ADULT MULTIVITAMIN W/MINERALS CH
1.0000 | ORAL_TABLET | Freq: Every day | ORAL | Status: DC
  Administered 2012-06-30 – 2012-07-07 (×7): 1 via ORAL
  Filled 2012-06-30 (×8): qty 1

## 2012-06-30 MED ORDER — ONDANSETRON HCL 4 MG/2ML IJ SOLN: 4.0000 mg | Freq: Four times a day (QID) | INTRAMUSCULAR | Status: DC | PRN

## 2012-06-30 MED ORDER — LORAZEPAM 1 MG PO TABS: 1.0000 mg | ORAL_TABLET | Freq: Four times a day (QID) | ORAL | Status: AC | PRN

## 2012-06-30 MED ORDER — INSULIN ASPART 100 UNIT/ML IV SOLN
10.0000 [IU] | Freq: Once | INTRAVENOUS | Status: DC
  Filled 2012-06-30: qty 0.1

## 2012-06-30 MED ORDER — ASPIRIN EC 81 MG PO TBEC
81.0000 mg | DELAYED_RELEASE_TABLET | Freq: Every day | ORAL | Status: DC
  Administered 2012-06-30 – 2012-07-04 (×4): 81 mg via ORAL
  Filled 2012-06-30 (×5): qty 1

## 2012-06-30 NOTE — Consult Note (Signed)
Arthur Woodard is an 52 y.o. male.   Chief Complaint: Shortness of breath. HPI: 52 years old male with past history of thrombocytopenia and anemia presented with cough and nasal congestion. Chest x-ray showed cardiomegaly and interstitial pulmonary edema + atelectasis.  Past Medical History  Diagnosis Date  . Cirrhosis   . Splenomegaly, congestive, chronic   . Thrombocytopenia   . Liver nodule 05/15/2011  . Allergy   . Anemia   . Diabetes mellitus   . Hypertension       Past Surgical History  Procedure Laterality Date  . Tooth extraction    . Bone marrow biopsy      Family History  Problem Relation Age of Onset  . Colon cancer Neg Hx   . Esophageal cancer Neg Hx   . Rectal cancer Neg Hx   . Stomach cancer Neg Hx    Social History:  reports that he has never smoked. He has never used smokeless tobacco. He reports that  drinks alcohol. He reports that he does not use illicit drugs.  Allergies: No Known Allergies   (Not in a hospital admission)  Results for orders placed during the hospital encounter of 06/30/12 (from the past 48 hour(s))  BLOOD GAS, ARTERIAL     Status: Abnormal   Collection Time    06/30/12  9:22 AM      Result Value Range   FIO2 0.21     Delivery systems ROOM AIR     pH, Arterial 7.330 (*) 7.350 - 7.450   pCO2 arterial 29.9 (*) 35.0 - 45.0 mmHg   pO2, Arterial 78.5 (*) 80.0 - 100.0 mmHg   Bicarbonate 15.3 (*) 20.0 - 24.0 mEq/L   TCO2 14.4  0 - 100 mmol/L   Acid-base deficit 9.1 (*) 0.0 - 2.0 mmol/L   O2 Saturation 93.0     Patient temperature 98.6     Collection site RIGHT RADIAL     Drawn by [phone]     Sample type ARTERIAL DRAW     Allens test (pass/fail) PASS  PASS  CBC WITH DIFFERENTIAL     Status: Abnormal   Collection Time    06/30/12  9:23 AM      Result Value Range   WBC 27.3 (*) 4.0 - 10.5 K/uL   RBC 3.29 (*) 4.22 - 5.81 MIL/uL   Hemoglobin 11.2 (*) 13.0 - 17.0 g/dL   HCT 04.5 (*) 40.9 - 81.1 %   MCV 92.4  78.0 - 100.0 fL   MCH 34.0  26.0 - 34.0 pg   MCHC 36.8 (*) 30.0 - 36.0 g/dL   RDW 91.4  78.2 - 95.6 %   Platelets 114 (*) 150 - 400 K/uL   Comment: PLATELET COUNT CONFIRMED BY SMEAR   Neutrophils Relative % 77  43 - 77 %   Lymphocytes Relative 8 (*) 12 - 46 %   Monocytes Relative 15 (*) 3 - 12 %   Eosinophils Relative 0  0 - 5 %   Basophils Relative 0  0 - 1 %   Neutro Abs 21.0 (*) 1.7 - 7.7 K/uL   Lymphs Abs 2.2  0.7 - 4.0 K/uL   Monocytes Absolute 4.1 (*) 0.1 - 1.0 K/uL   Eosinophils Absolute 0.0  0.0 - 0.7 K/uL   Basophils Absolute 0.0  0.0 - 0.1 K/uL   RBC Morphology TARGET CELLS     WBC Morphology       Value: MODERATE LEFT SHIFT (>5% METAS AND MYELOS,OCC  PRO NOTED)  COMPREHENSIVE METABOLIC PANEL     Status: Abnormal (Preliminary result)   Collection Time    06/30/12  9:23 AM      Result Value Range   Sodium 121 (*) 135 - 145 mEq/L   Potassium PENDING  3.5 - 5.1 mEq/L   Chloride 92 (*) 96 - 112 mEq/L   CO2 17 (*) 19 - 32 mEq/L   Glucose, Bld 67 (*) 70 - 99 mg/dL   BUN 65 (*) 6 - 23 mg/dL   Creatinine, Ser 1.61 (*) 0.50 - 1.35 mg/dL   Calcium 8.5  8.4 - 09.6 mg/dL   Total Protein 7.2  6.0 - 8.3 g/dL   Albumin 1.5 (*) 3.5 - 5.2 g/dL   AST 045 (*) 0 - 37 U/L   ALT 119 (*) 0 - 53 U/L   Alkaline Phosphatase 428 (*) 39 - 117 U/L   Total Bilirubin 3.2 (*) 0.3 - 1.2 mg/dL   GFR calc non Af Amer 12 (*) >90 mL/min   GFR calc Af Amer 14 (*) >90 mL/min   Comment:            The eGFR has been calculated     using the CKD EPI equation.     This calculation has not been     validated in all clinical     situations.     eGFR's persistently     <90 mL/min signify     possible Chronic Kidney Disease.  PRO B NATRIURETIC PEPTIDE     Status: Abnormal   Collection Time    06/30/12  9:23 AM      Result Value Range   Pro B Natriuretic peptide (BNP) 1577.0 (*) 0 - 125 pg/mL  POCT I-STAT TROPONIN I     Status: Abnormal   Collection Time    06/30/12  9:30 AM      Result Value Range   Troponin i, poc  0.38 (*) 0.00 - 0.08 ng/mL   Comment NOTIFIED PHYSICIAN     Comment 3            Comment: Due to the release kinetics of cTnI,     a negative result within the first hours     of the onset of symptoms does not rule out     myocardial infarction with certainty.     If myocardial infarction is still suspected,     repeat the test at appropriate intervals.  URINALYSIS, ROUTINE W REFLEX MICROSCOPIC     Status: Abnormal   Collection Time    06/30/12 10:36 AM      Result Value Range   Color, Urine ORANGE (*) YELLOW   Comment: BIOCHEMICALS MAY BE AFFECTED BY COLOR   APPearance CLOUDY (*) CLEAR   Specific Gravity, Urine 1.021  1.005 - 1.030   pH 5.0  5.0 - 8.0   Glucose, UA NEGATIVE  NEGATIVE mg/dL   Hgb urine dipstick NEGATIVE  NEGATIVE   Bilirubin Urine SMALL (*) NEGATIVE   Ketones, ur NEGATIVE  NEGATIVE mg/dL   Protein, ur NEGATIVE  NEGATIVE mg/dL   Urobilinogen, UA 1.0  0.0 - 1.0 mg/dL   Nitrite NEGATIVE  NEGATIVE   Leukocytes, UA TRACE (*) NEGATIVE  URINE MICROSCOPIC-ADD ON     Status: Abnormal   Collection Time    06/30/12 10:36 AM      Result Value Range   Squamous Epithelial / LPF RARE  RARE   WBC, UA 0-2  <3 WBC/hpf  RBC / HPF 0-2  <3 RBC/hpf   Bacteria, UA FEW (*) RARE   Urine-Other SPERM PRESENT    POTASSIUM     Status: Abnormal   Collection Time    06/30/12 12:34 PM      Result Value Range   Potassium 7.4 (*) 3.5 - 5.1 mEq/L   Comment: RESULT REPEATED AND VERIFIED     NO VISIBLE HEMOLYSIS     CRITICAL RESULT CALLED TO, READ BACK BY AND VERIFIED WITH:     M. TEUP AT 1234 ON 05.13.14 BY HOBBINS, J.  GLUCOSE, CAPILLARY     Status: Abnormal   Collection Time    06/30/12  1:25 PM      Result Value Range   Glucose-Capillary 128 (*) 70 - 99 mg/dL  POCT I-STAT, CHEM 8     Status: Abnormal   Collection Time    06/30/12  1:38 PM      Result Value Range   Sodium 127 (*) 135 - 145 mEq/L   Potassium 8.4 (*) 3.5 - 5.1 mEq/L   Chloride 103  96 - 112 mEq/L   BUN 88 (*) 6  - 23 mg/dL   Creatinine, Ser 1.61 (*) 0.50 - 1.35 mg/dL   Glucose, Bld 21 (*) 70 - 99 mg/dL   Calcium, Ion 0.96 (*) 1.12 - 1.23 mmol/L   TCO2 19  0 - 100 mmol/L   Hemoglobin 12.2 (*) 13.0 - 17.0 g/dL   HCT 04.5 (*) 40.9 - 81.1 %  POCT I-STAT, CHEM 8     Status: Abnormal   Collection Time    06/30/12  2:00 PM      Result Value Range   Sodium 127 (*) 135 - 145 mEq/L   Potassium 7.5 (*) 3.5 - 5.1 mEq/L   Chloride 100  96 - 112 mEq/L   BUN 67 (*) 6 - 23 mg/dL   Creatinine, Ser 9.14 (*) 0.50 - 1.35 mg/dL   Glucose, Bld 782 (*) 70 - 99 mg/dL   Calcium, Ion 9.56 (*) 1.12 - 1.23 mmol/L   TCO2 19  0 - 100 mmol/L   Hemoglobin 11.2 (*) 13.0 - 17.0 g/dL   HCT 21.3 (*) 08.6 - 57.8 %   Comment NOTIFIED PHYSICIAN     Dg Chest Port 1 View  06/30/2012  *RADIOLOGY REPORT*  Clinical Data: Cough and shortness of breath.  PORTABLE CHEST - 1 VIEW  Comparison: None.  Findings: There is cardiomegaly and interstitial edema.  Small right effusion and basilar airspace disease are identified.  IMPRESSION: Interstitial pulmonary edema.  Small right effusion and basilar airspace disease likely representing atelectasis.   Original Report Authenticated By: Holley Dexter, M.D.     @ROS @ Constitutional: Positive for fever, chills and fatigue.  Eyes: Negative.  Respiratory: Positive for cough and shortness of breath.  Cardiovascular: Positive for chest pain and leg swelling.  Gastrointestinal: Positive for abdominal distention. Negative for vomiting and diarrhea.  Endocrine: Negative.  Genitourinary: Negative.  Musculoskeletal: Negative.  Skin: Positive for rash.  Allergic/Immunologic: Positive for environmental allergies.  Neurological: Positive for dizziness.  Hematological: Negative.  Psychiatric/Behavioral: Negative.   Physical Exam  Blood pressure 113/54, pulse 108, temperature 98.7 F (37.1 C), temperature source Oral, resp. rate 26, SpO2 96.00%.  Constitutional: He appears well-developed and  well-nourished.  HENT: Head: Normocephalic and atraumatic. Mouth/Throat: Oropharynx is clear and moist.  Eyes: Brown, Conjunctivae are normal. Pupils are equal, round, and reactive to light.  Neck: Normal range of motion. Neck supple.  Cardiovascular: Tachycardia present. III/VI systolic murmur Pulmonary/Chest: tachypnea and decreased air entry at bilateral bases.  Abdominal: Soft. Bowel sounds are normal. He exhibits distension.  Musculoskeletal: He exhibits 1 + lower extremity edema.  Neurological: He is alert and oriented to person, place, and time.  Skin: Skin is warm and dry.  Psychiatric: He has a normal mood and affect. His behavior is normal. Judgment and thought content normal.    Assessment/Plan Acute left heart systolic failure Acute renal failure Chronic anemia Chronic thrombocytopenia DM, II Hypertension Liver cirrhosis  2-D echocardiogram for LV function. Fluid restriction Consider inotropic support. Appreciate renal consult  Mekaela Azizi S 06/30/2012, 2:59 PM

## 2012-06-30 NOTE — Progress Notes (Signed)
CRITICAL VALUE ALERT  Critical value received: Troponin 1.64 ; CKMB 11.8  Date of notification: 06/30/12  Time of notification: 1745  Critical value read back:yes  Nurse who received alert:  Ermalene Postin RN  MD notified (1st page):Dr. Waymon Amato  Time of first page: 1750  MD notified (2nd page):  Time of second page:  Responding MD:  same  Time MD responded:  1755

## 2012-06-30 NOTE — Progress Notes (Signed)
Dr. Algie Coffer notified by phone of troponin and ckmb results.  Anthonette Legato

## 2012-06-30 NOTE — Consult Note (Signed)
Reason for Consult:AKI Referring Physician: Dr Lavena Bullion is an 52 y.o. male.  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  Meds at home lisinopril, glimeperide, cough syrup, ibuprofen 7 Atrovostatin,.  Notes 3 mon of being "sick", with poor appetite, N, weak , PND, 3 pillow orthop, increasing ankle edema.  Longer duration of abdm swelling and hx of liver nodules, ?HC  Ca.  Has coughing worse at night,nonproductive.  No D, V, rash but dry skin and itching. No Ms cramping Constitutional: as above Eyes: poor vision Ears, nose, mouth, throat, and face: negative Respiratory: SOB with exertion and seasonal HF Cardiovascular: as above Gastrointestinal: as above Genitourinary:negative Integument/breast: as above Hematologic/lymphatic: hx thrombocytopenia Musculoskeletal:diffuse aches and pains Neurological: negative Endocrine: doesn't check bs reg Allergic/Immunologic: as above   Past Medical History  Diagnosis Date  . Cirrhosis   . Splenomegaly, congestive, chronic   . Thrombocytopenia   . Liver nodule 05/15/2011  . Allergy   . Anemia   . Diabetes mellitus   . Hypertension     Past Surgical History  Procedure Laterality Date  . Tooth extraction    . Bone marrow biopsy      Family History  Problem Relation Age of Onset  . Colon cancer Neg Hx   . Esophageal cancer Neg Hx   . Rectal cancer Neg Hx   . Stomach cancer Neg Hx     Social History:  reports that he has never smoked. He has never used smokeless tobacco. He reports that  drinks alcohol. He reports that he does not use illicit drugs.  Allergies: No Known Allergies  Medications: I have reviewed the patient's current medications. Prior to Admission:  (Not in a hospital admission)  Results for orders placed during the hospital encounter of 06/30/12 (from the past 48 hour(s))  BLOOD GAS, ARTERIAL     Status: Abnormal   Collection Time    06/30/12  9:22 AM       Result Value Range   FIO2 0.21     Delivery systems ROOM AIR     pH, Arterial 7.330 (*) 7.350 - 7.450   pCO2 arterial 29.9 (*) 35.0 - 45.0 mmHg   pO2, Arterial 78.5 (*) 80.0 - 100.0 mmHg   Bicarbonate 15.3 (*) 20.0 - 24.0 mEq/L   TCO2 14.4  0 - 100 mmol/L   Acid-base deficit 9.1 (*) 0.0 - 2.0 mmol/L   O2 Saturation 93.0     Patient temperature 98.6     Collection site RIGHT RADIAL     Drawn by [phone]     Sample type ARTERIAL DRAW     Allens test (pass/fail) PASS  PASS  CBC WITH DIFFERENTIAL     Status: Abnormal   Collection Time    06/30/12  9:23 AM      Result Value Range   WBC 27.3 (*) 4.0 - 10.5 K/uL   RBC 3.29 (*) 4.22 - 5.81 MIL/uL   Hemoglobin 11.2 (*) 13.0 - 17.0 g/dL   HCT 04.5 (*) 40.9 - 81.1 %   MCV 92.4  78.0 - 100.0 fL   MCH 34.0  26.0 - 34.0 pg   MCHC 36.8 (*) 30.0 - 36.0 g/dL   RDW 91.4  78.2 - 95.6 %   Platelets 114 (*) 150 - 400 K/uL   Comment: PLATELET COUNT CONFIRMED BY SMEAR   Neutrophils Relative % 77  43 - 77 %  Lymphocytes Relative 8 (*) 12 - 46 %   Monocytes Relative 15 (*) 3 - 12 %   Eosinophils Relative 0  0 - 5 %   Basophils Relative 0  0 - 1 %   Neutro Abs 21.0 (*) 1.7 - 7.7 K/uL   Lymphs Abs 2.2  0.7 - 4.0 K/uL   Monocytes Absolute 4.1 (*) 0.1 - 1.0 K/uL   Eosinophils Absolute 0.0  0.0 - 0.7 K/uL   Basophils Absolute 0.0  0.0 - 0.1 K/uL   RBC Morphology TARGET CELLS     WBC Morphology       Value: MODERATE LEFT SHIFT (>5% METAS AND MYELOS,OCC PRO NOTED)  COMPREHENSIVE METABOLIC PANEL     Status: Abnormal (Preliminary result)   Collection Time    06/30/12  9:23 AM      Result Value Range   Sodium 121 (*) 135 - 145 mEq/L   Potassium PENDING  3.5 - 5.1 mEq/L   Chloride 92 (*) 96 - 112 mEq/L   CO2 17 (*) 19 - 32 mEq/L   Glucose, Bld 67 (*) 70 - 99 mg/dL   BUN 65 (*) 6 - 23 mg/dL   Creatinine, Ser 1.61 (*) 0.50 - 1.35 mg/dL   Calcium 8.5  8.4 - 09.6 mg/dL   Total Protein 7.2  6.0 - 8.3 g/dL   Albumin 1.5 (*) 3.5 - 5.2 g/dL   AST 045 (*)  0 - 37 U/L   ALT 119 (*) 0 - 53 U/L   Alkaline Phosphatase 428 (*) 39 - 117 U/L   Total Bilirubin 3.2 (*) 0.3 - 1.2 mg/dL   GFR calc non Af Amer 12 (*) >90 mL/min   GFR calc Af Amer 14 (*) >90 mL/min   Comment:            The eGFR has been calculated     using the CKD EPI equation.     This calculation has not been     validated in all clinical     situations.     eGFR's persistently     <90 mL/min signify     possible Chronic Kidney Disease.  PRO B NATRIURETIC PEPTIDE     Status: Abnormal   Collection Time    06/30/12  9:23 AM      Result Value Range   Pro B Natriuretic peptide (BNP) 1577.0 (*) 0 - 125 pg/mL  POCT I-STAT TROPONIN I     Status: Abnormal   Collection Time    06/30/12  9:30 AM      Result Value Range   Troponin i, poc 0.38 (*) 0.00 - 0.08 ng/mL   Comment NOTIFIED PHYSICIAN     Comment 3            Comment: Due to the release kinetics of cTnI,     a negative result within the first hours     of the onset of symptoms does not rule out     myocardial infarction with certainty.     If myocardial infarction is still suspected,     repeat the test at appropriate intervals.  URINALYSIS, ROUTINE W REFLEX MICROSCOPIC     Status: Abnormal   Collection Time    06/30/12 10:36 AM      Result Value Range   Color, Urine ORANGE (*) YELLOW   Comment: BIOCHEMICALS MAY BE AFFECTED BY COLOR   APPearance CLOUDY (*) CLEAR   Specific Gravity, Urine 1.021  1.005 - 1.030  pH 5.0  5.0 - 8.0   Glucose, UA NEGATIVE  NEGATIVE mg/dL   Hgb urine dipstick NEGATIVE  NEGATIVE   Bilirubin Urine SMALL (*) NEGATIVE   Ketones, ur NEGATIVE  NEGATIVE mg/dL   Protein, ur NEGATIVE  NEGATIVE mg/dL   Urobilinogen, UA 1.0  0.0 - 1.0 mg/dL   Nitrite NEGATIVE  NEGATIVE   Leukocytes, UA TRACE (*) NEGATIVE  URINE MICROSCOPIC-ADD ON     Status: Abnormal   Collection Time    06/30/12 10:36 AM      Result Value Range   Squamous Epithelial / LPF RARE  RARE   WBC, UA 0-2  <3 WBC/hpf   RBC / HPF 0-2   <3 RBC/hpf   Bacteria, UA FEW (*) RARE   Urine-Other SPERM PRESENT    POTASSIUM     Status: Abnormal   Collection Time    06/30/12 12:34 PM      Result Value Range   Potassium 7.4 (*) 3.5 - 5.1 mEq/L   Comment: RESULT REPEATED AND VERIFIED     NO VISIBLE HEMOLYSIS     CRITICAL RESULT CALLED TO, READ BACK BY AND VERIFIED WITH:     M. TEUP AT 1234 ON 05.13.14 BY HOBBINS, J.  GLUCOSE, CAPILLARY     Status: Abnormal   Collection Time    06/30/12  1:25 PM      Result Value Range   Glucose-Capillary 128 (*) 70 - 99 mg/dL  POCT I-STAT, CHEM 8     Status: Abnormal   Collection Time    06/30/12  1:38 PM      Result Value Range   Sodium 127 (*) 135 - 145 mEq/L   Potassium 8.4 (*) 3.5 - 5.1 mEq/L   Chloride 103  96 - 112 mEq/L   BUN 88 (*) 6 - 23 mg/dL   Creatinine, Ser 8.46 (*) 0.50 - 1.35 mg/dL   Glucose, Bld 21 (*) 70 - 99 mg/dL   Calcium, Ion 9.62 (*) 1.12 - 1.23 mmol/L   TCO2 19  0 - 100 mmol/L   Hemoglobin 12.2 (*) 13.0 - 17.0 g/dL   HCT 95.2 (*) 84.1 - 32.4 %  POCT I-STAT, CHEM 8     Status: Abnormal   Collection Time    06/30/12  2:00 PM      Result Value Range   Sodium 127 (*) 135 - 145 mEq/L   Potassium 7.5 (*) 3.5 - 5.1 mEq/L   Chloride 100  96 - 112 mEq/L   BUN 67 (*) 6 - 23 mg/dL   Creatinine, Ser 4.01 (*) 0.50 - 1.35 mg/dL   Glucose, Bld 027 (*) 70 - 99 mg/dL   Calcium, Ion 2.53 (*) 1.12 - 1.23 mmol/L   TCO2 19  0 - 100 mmol/L   Hemoglobin 11.2 (*) 13.0 - 17.0 g/dL   HCT 66.4 (*) 40.3 - 47.4 %   Comment NOTIFIED PHYSICIAN      Dg Chest Port 1 View  06/30/2012  *RADIOLOGY REPORT*  Clinical Data: Cough and shortness of breath.  PORTABLE CHEST - 1 VIEW  Comparison: None.  Findings: There is cardiomegaly and interstitial edema.  Small right effusion and basilar airspace disease are identified.  IMPRESSION: Interstitial pulmonary edema.  Small right effusion and basilar airspace disease likely representing atelectasis.   Original Report Authenticated By: Holley Dexter, M.D.     @ROS @ Blood pressure 113/54, pulse 108, temperature 98.7 F (37.1 C), temperature source Oral, resp. rate 26, SpO2 96.00%. @  PHYSEXAMBYAGE2@ Physical Examination: General appearance - chronically ill appearing and icteric Mental status - alert, oriented to person, place, and time Eyes - H &E silver wiring Ears - bilateral TM's and external ear canals normal Nose - normal and patent, no erythema, discharge or polyps Mouth - whitish film on tongue Neck - adenopathy noted PCL Lymphatics - posterior cervical nodes Chest - rales noted bibasilar, decreased air entry noted bilat Heart - S1 and S2 normal, systolic murmur Gr2/6 at apex Abdomen - hepatomegaly distended, pos FW tense Neurological - alert, oriented, normal speech, no focal findings or movement disorder noted Musculoskeletal - no joint tenderness, deformity or swelling Extremities - pedal edema 3+ + Skin - jaundiced, neuroderm, erosions  Assessment/Plan: 1 AKI  Apparent, but suspect subacute.  Obvious issues are NSAID, ACEI, in setting of liver disease and suspect that is substrate but aggravated by drugs. R/O HRS.  Low SNA secondary to liver disease,.  ^ K from meds (ADEI, NSAID,0 adn AKI.  Very serious. Need to take care of these and tx acidemia.  Mild dyspnea so would not lower vol acutely esp in setting of liver disease.  Allow to equilibrate and see how he responds.  2 ^ K as above 3 Hypertension: need to not use ACEI 4. Anemia  5. Thrombocytopenia 6 Low SNa from liver dz 7 ? Hepatocellular Ca will limit outcomes and intensity of tx 8 DM P limit fluids, no diuretics, give bicarb, fluid restrict, U/S, Urine chem, OSM,  foley  King Pinzon L 06/30/2012, 2:58 PM

## 2012-06-30 NOTE — H&P (Addendum)
[facility] History and Physical  Arthur Woodard MWU:[id number] DOB: Jul 26, 1960 DOA: 06/30/2012  Referring physician: Dr. Margarita Grizzle, EDP PCP: Erlinda Hong, MD  OP Specialists:  1. GI: Dr. Vonna Kotyk Pyrtle 2. Hematology: Dr. Jethro Bolus  Chief Complaint: Cough.  HPI: Arthur Woodard is a 52 y.o. male with history of alcoholic cirrhosis, thrombocytopenia, coagulopathy, liver lesions of uncertain nature (elevated alpha-fetoprotein-? HCC-declined liver biopsy), HTN, DM, iron deficiency anemia and splenomegaly presented to the [facility] ED on 06/30/12 with complaints of cough and dyspnea. Patient gives approximately 3 month history of cough, progressively worsening, mostly dry but at times has white clear sputum and no blood, associated with chills and low-grade fever but has not checked temperatures. Was seen by PCP a couple of weeks ago and treated with Z-Pak and cough medicine. Symptoms temporarily improved but returned. For the last 2 weeks, complains of progressively worsening dyspnea, orthopnea but no PND. Dyspnea is worse with activity, during cough spells and while talking. Some anterior chest pain but only while coughing. Also gives 3 day history of bilateral leg swelling and abdominal distention without pain. Urine output has apparently decreased and yellowish in color. His wife indicates that he has dizziness on standing, pruritus with some blood after scratching and yellowish discoloration of eyes. Patient claims that he has not had alcohol for approximately a month. He complains of poor appetite and early satiety but no nausea, vomiting or diarrhea. No bleeding. In the ED, sodium 121, potassium 7.4, chloride 92, bicarbonate 17, BUN 65, creatinine 4.91, glucose 67, alkaline phosphatase 428, albumin 1.5, AST 325, ALT 119, troponin 0.38, proBNP 1577, INR 2.7, hemoglobin 11.2, platelets 114 and WBC 27.3. Chest x-ray suggestive of interstitial edema. Patient has received a dose of  Kayexalate, insulin, dextrose, calcium, Rocephin and azithromycin. EDP has consulted cardiology. Hospitalist admission requested.  Review of Systems: All systems reviewed and apart from history of presenting illness, are negative  Past Medical History  Diagnosis Date  . Cirrhosis   . Splenomegaly, congestive, chronic   . Thrombocytopenia   . Liver nodule 05/15/2011  . Allergy   . Anemia   . Diabetes mellitus   . Hypertension    Past Surgical History  Procedure Laterality Date  . Tooth extraction    . Bone marrow biopsy     Social History:  reports that he has never smoked. He has never used smokeless tobacco. He reports that  drinks alcohol. He reports that he does not use illicit drugs. Married. Lives with spouse and independent of activities of daily living.  No Known Allergies  Family History  Problem Relation Age of Onset  . Colon cancer Neg Hx   . Esophageal cancer Neg Hx   . Rectal cancer Neg Hx   . Stomach cancer Neg Hx     Prior to Admission medications   Medication Sig Start Date End Date Taking? Authorizing Provider  atorvastatin (LIPITOR) 20 MG tablet Take 20 mg by mouth Daily. 10/26/11  Yes Historical Provider, MD  cetirizine (ZYRTEC) 10 MG tablet Take 10 mg by mouth daily.   Yes Historical Provider, MD  fluticasone (VERAMYST) 27.5 MCG/SPRAY nasal spray Place 2 sprays into the nose daily.   Yes Historical Provider, MD  glimepiride (AMARYL) 4 MG tablet Take 4 mg by mouth Daily. 04/29/11  Yes Historical Provider, MD  guaiFENesin-codeine (ROBITUSSIN AC) 100-10 MG/5ML syrup Take 5 mLs by mouth 4 (four) times daily as needed for cough (cough).   Yes Historical Provider, MD  lisinopril (PRINIVIL,ZESTRIL) 20 MG tablet Take 20 mg by mouth daily.   Yes Historical Provider, MD   Physical Exam: Filed Vitals:   06/30/12 0912 06/30/12 0936 06/30/12 1307 06/30/12 1317  BP:   113/54   Pulse:      Temp:   98.7 F (37.1 C)   TempSrc:   Oral   Resp:   26   SpO2: 96% 95% 96%  96%     General exam: Moderately built and nourished male patient, lying propped up on the gurney in no obvious distress.  Head, eyes and ENT: Nontraumatic and normocephalic. Pupils equally reacting to light and accommodation. Oral mucosa moist. Scleral icterus and? Tiny left temporal subconjunctival hemorrhage.  Neck: Supple. No JVD, carotid bruit or thyromegaly.  Lymphatics: No lymphadenopathy.  Respiratory system: Refused breath sounds in the bases right > left with a few basal crackles. Rest of lung fields are clear to auscultation. No increased work of breathing.  Cardiovascular system: S1 and S2 heard, RRR. No JVD or clicks. 2+ bilateral lower extremity pitting edema extending to groin. 2+ systolic ejection murmur, best heard at apex.  Gastrointestinal system: Abdomen is moderately distended but not tense, soft and nontender. Normal bowel sounds heard. No organomegaly or masses appreciated. Possible ascites.  Central nervous system: Alert and oriented. No focal neurological deficits.  Extremities: Symmetric 5 x 5 power. Peripheral pulses symmetrically felt.  Skin: No rashes or acute findings. Patient has a few scratch marks on abdomen which are not actively bleeding and appeared clean.  Musculoskeletal system: Negative exam.  Psychiatry: Pleasant and cooperative.   Labs on Admission:  Basic Metabolic Panel:  Recent Labs Lab 06/30/12 0923 06/30/12 1234 06/30/12 1338 06/30/12 1400  NA 121*  --  127* 127*  K >7.5* 7.4* 8.4* 7.5*  CL 92*  --  103 100  CO2 17*  --   --   --   GLUCOSE 67*  --  21* 130*  BUN 65*  --  88* 67*  CREATININE 4.91*  --  5.00* 4.50*  CALCIUM 8.5  --   --   --    Liver Function Tests:  Recent Labs Lab 06/30/12 0923  AST 325*  ALT 119*  ALKPHOS 428*  BILITOT 3.2*  PROT 7.2  ALBUMIN 1.5*   No results found for this basename: LIPASE, AMYLASE,  in the last 168 hours No results found for this basename: AMMONIA,  in the last 168  hours CBC:  Recent Labs Lab 06/30/12 0923 06/30/12 1338 06/30/12 1400  WBC 27.3*  --   --   NEUTROABS 21.0*  --   --   HGB 11.2* 12.2* 11.2*  HCT 30.4* 36.0* 33.0*  MCV 92.4  --   --   PLT 114*  --   --    Cardiac Enzymes: No results found for this basename: CKTOTAL, CKMB, CKMBINDEX, TROPONINI,  in the last 168 hours  BNP (last 3 results)  Recent Labs  06/30/12 0923  PROBNP 1577.0*   CBG:  Recent Labs Lab 06/30/12 1325  GLUCAP 128*    Radiological Exams on Admission: Dg Chest Port 1 View  06/30/2012  *RADIOLOGY REPORT*  Clinical Data: Cough and shortness of breath.  PORTABLE CHEST - 1 VIEW  Comparison: None.  Findings: There is cardiomegaly and interstitial edema.  Small right effusion and basilar airspace disease are identified.  IMPRESSION: Interstitial pulmonary edema.  Small right effusion and basilar airspace disease likely representing atelectasis.   Original Report Authenticated By: Maisie Fus  Maricela Curet, M.D.     EKG: Independently reviewed. Sinus tachycardia to 104 beats per minute, normal axis, intraventricular conduction delay.  Assessment/Plan Principal Problem:   Acute renal failure Active Problems:   HYPERLIPIDEMIA   ALCOHOL ABUSE   HYPERTENSION   Cirrhosis   Thrombocytopenia   Splenomegaly, congestive, chronic   Liver nodule   Hyperkalemia   Dyspnea   Leukocytosis   Anemia   Hypoglycemia   Elevated troponin   CHF, acute   Anasarca   Diabetes mellitus   1. Acute renal failure: DD-secondary to dehydration, ACE inhibitors and NSAIDs versus hepatorenal syndrome. Nephrology consulted and recommend no IV fluids or diuretics at this time. Check urine sodium, creatinine, renal ultrasound and follow daily BMP. Discontinue ACE inhibitors indefinitely and avoid NSAIDs. Patient's initial symptom of cough may have been due to lisinopril. 2. Hyperkalemia: Secondary to acute renal failure and ACE inhibitor. Discontinue lisinopril. Will repeat a dose of  Kayexalate, insulin and dextrose and follow BMP in 3-4 hours and addressed appropriately. Nephrology consult in. Monitor on telemetry. 3. Dyspnea: Possibly multifactorial from new onset CHF (no echo available to comment on systolic or diastolic), atelectasis and cannot rule out pneumonia. As per nephrology recommendations, avoid diuretics given acute renal failure and possibility of hepatorenal syndrome which could worsen. Check 2-D echo. Continue IV Rocephin and azithromycin. 4. Elevated troponin: Likely secondary to acute renal failure and decompensated CHF. Cycle cardiac enzymes. Low dose aspirin. Cardiology consulted by EDP-will await recommendations. 5. Hypoglycemia/type II DM: Hold oral hypoglycemics. Frequent and close CBG monitoring and address appropriately. 6. History of alcohol abuse: Per patient and spouse, has not had alcohol for approximately a month. In any event will place on alcohol withdrawal protocol and monitor. 7. Hypertension: Controlled. Monitor off ACE inhibitors and may have to consider alternate agents. 8. Hyperlipidemia: Hold statins given worsened LFTs. 9. Alcoholic cirrhosis/coagulopathy/thrombocytopenia/splenomegaly/liver nodules-? HCC: Seems to have decompensated compared to recent visit with GI in February 2014. Alpha-fetoprotein at that time was 23. Will discuss with GI in a.m. No bleeding. 10. Leukocytosis: Unclear etiology.? Stress margination versus pneumonia. Follow CBC in a.m. 11. Anemia:? Chronic disease. Followup CBC in a.m. 12. Anasarca: Secondary to cirrhosis, low albumin and decompensated CHF. Difficult situation in that, for anasarca and decompensated CHF would benefit from diuretics but at the same time this could make his renal functions worse. Since patient seems to have compensated fairly well from his volume overload and currently does not look overtly distressed, per nephrologist advice-hold diuretics and monitor. 13. Hyponatremia: Secondary to cirrhosis  and acute renal failure. Follow daily BMP. Management per nephrology.     Code Status: Full  Family Communication: Discussed with spouse at bedside  Disposition Plan: Admit as inpatient.   Time spent: 70 minutes  [facility] Pager [phone]  If 7PM-7AM, please contact night-coverage [email or web address] Password [email or web address] 06/30/2012, 3:20 PM

## 2012-06-30 NOTE — Progress Notes (Signed)
UR completed 

## 2012-06-30 NOTE — ED Notes (Signed)
WUJ:[id number]<JY> Expected date:<BR> Expected time:<BR> Means of arrival:<BR> Comments:<BR> Cough x 3 months

## 2012-06-30 NOTE — ED Provider Notes (Signed)
History     CSN: [id number]  Arrival date & time 06/30/12  4098   First MD Initiated Contact with Patient 06/30/12 [phone]      Chief Complaint  Patient presents with  . Cough  . Nasal Congestion    (Consider location/radiation/quality/duration/timing/severity/associated sxs/prior treatment) HPI Cough for three months with fever off and on, cough productive of white sputum.  Intermittent fever subjective, some chills.  Feels he has had fever in past 24 hours.  Seen by Dr. Thurmond Butts for same two months ago, no tests,  And given robitussin, z pack.  Nonsmoker, sob, seasonal allergies.  Patient from [place] not out of country recently.  Past Medical History  Diagnosis Date  . Cirrhosis   . Splenomegaly, congestive, chronic   . Thrombocytopenia   . Liver nodule 05/15/2011  . Allergy   . Anemia   . Diabetes mellitus   . Hypertension     Past Surgical History  Procedure Laterality Date  . Tooth extraction    . Bone marrow biopsy      Family History  Problem Relation Age of Onset  . Colon cancer Neg Hx   . Esophageal cancer Neg Hx   . Rectal cancer Neg Hx   . Stomach cancer Neg Hx     History  Substance Use Topics  . Smoking status: Never Smoker   . Smokeless tobacco: Never Used  . Alcohol Use: Yes     Comment: 2 beers/day on and off      Review of Systems  Constitutional: Positive for fever, chills and fatigue.  Eyes: Negative.   Respiratory: Positive for cough and shortness of breath.   Cardiovascular: Positive for chest pain and leg swelling.  Gastrointestinal: Positive for abdominal distention. Negative for vomiting and diarrhea.  Endocrine: Negative.   Genitourinary: Negative.   Musculoskeletal: Negative.   Skin: Positive for rash.  Allergic/Immunologic: Positive for environmental allergies.  Neurological: Positive for dizziness.  Hematological: Negative.   Psychiatric/Behavioral: Negative.     Allergies  Review of patient's allergies indicates no known  allergies.  Home Medications   Current Outpatient Rx  Name  Route  Sig  Dispense  Refill  . atorvastatin (LIPITOR) 20 MG tablet   Oral   Take 20 mg by mouth Daily.         . cetirizine (ZYRTEC) 10 MG tablet   Oral   Take 10 mg by mouth daily.         . fluticasone (VERAMYST) 27.5 MCG/SPRAY nasal spray   Nasal   Place 2 sprays into the nose daily.         Marland Kitchen glimepiride (AMARYL) 4 MG tablet   Oral   Take 4 mg by mouth Daily.         Marland Kitchen lisinopril (PRINIVIL,ZESTRIL) 20 MG tablet   Oral   Take 20 mg by mouth daily.           There were no vitals taken for this visit.  Physical Exam  Nursing note and vitals reviewed. Constitutional: He is oriented to person, place, and time. He appears well-developed and well-nourished.  HENT:  Head: Normocephalic and atraumatic.  Right Ear: External ear normal.  Left Ear: External ear normal.  Nose: Nose normal.  Mouth/Throat: Oropharynx is clear and moist.  Eyes: Conjunctivae are normal. Pupils are equal, round, and reactive to light.  Neck: Normal range of motion. Neck supple.  Cardiovascular: Tachycardia present.   Pulmonary/Chest:  tachypneic Decreased bilateral bases  Abdominal: Soft. Bowel  sounds are normal. He exhibits distension.  Musculoskeletal: He exhibits edema.  Neurological: He is alert and oriented to person, place, and time.  Skin: Skin is warm and dry.  Psychiatric: He has a normal mood and affect. His behavior is normal. Judgment and thought content normal.    ED Course  Procedures (including critical care time)  Labs Reviewed - No data to display No results found.   No diagnosis found.    Date: 06/30/2012  Rate: 104  Rhythm: sinus tachycardia  QRS Axis: normal  Intervals: normal  ST/T Wave abnormalities: nonspecific ST/T changes  Conduction Disutrbances:nonspecific intraventricular conduction delay  Narrative Interpretation:   Old EKG Reviewed: none available  Sodium 121 Potassium  7.5 co217 Bun 65 Cr 4.91 tbili 3.2 ast 425 Alt 119 D-dimer 19  Results for orders placed during the hospital encounter of 06/30/12  CBC WITH DIFFERENTIAL      Result Value Range   WBC 27.3 (*) 4.0 - 10.5 K/uL   RBC 3.29 (*) 4.22 - 5.81 MIL/uL   Hemoglobin 11.2 (*) 13.0 - 17.0 g/dL   HCT 16.1 (*) 09.6 - 04.5 %   MCV 92.4  78.0 - 100.0 fL   MCH 34.0  26.0 - 34.0 pg   MCHC 36.8 (*) 30.0 - 36.0 g/dL   RDW 40.9  81.1 - 91.4 %   Platelets 114 (*) 150 - 400 K/uL   Neutrophils Relative 77  43 - 77 %   Lymphocytes Relative 8 (*) 12 - 46 %   Monocytes Relative 15 (*) 3 - 12 %   Eosinophils Relative 0  0 - 5 %   Basophils Relative 0  0 - 1 %   Neutro Abs 21.0 (*) 1.7 - 7.7 K/uL   Lymphs Abs 2.2  0.7 - 4.0 K/uL   Monocytes Absolute 4.1 (*) 0.1 - 1.0 K/uL   Eosinophils Absolute 0.0  0.0 - 0.7 K/uL   Basophils Absolute 0.0  0.0 - 0.1 K/uL   RBC Morphology TARGET CELLS     WBC Morphology       Value: MODERATE LEFT SHIFT (>5% METAS AND MYELOS,OCC PRO NOTED)  COMPREHENSIVE METABOLIC PANEL      Result Value Range   Sodium 121 (*) 135 - 145 mEq/L   Potassium PENDING  3.5 - 5.1 mEq/L   Chloride 92 (*) 96 - 112 mEq/L   CO2 17 (*) 19 - 32 mEq/L   Glucose, Bld 67 (*) 70 - 99 mg/dL   BUN 65 (*) 6 - 23 mg/dL   Creatinine, Ser 7.82 (*) 0.50 - 1.35 mg/dL   Calcium 8.5  8.4 - 95.6 mg/dL   Total Protein 7.2  6.0 - 8.3 g/dL   Albumin 1.5 (*) 3.5 - 5.2 g/dL   AST 213 (*) 0 - 37 U/L   ALT 119 (*) 0 - 53 U/L   Alkaline Phosphatase 428 (*) 39 - 117 U/L   Total Bilirubin 3.2 (*) 0.3 - 1.2 mg/dL   GFR calc non Af Amer 12 (*) >90 mL/min   GFR calc Af Amer 14 (*) >90 mL/min  PRO B NATRIURETIC PEPTIDE      Result Value Range   Pro B Natriuretic peptide (BNP) 1577.0 (*) 0 - 125 pg/mL  BLOOD GAS, ARTERIAL      Result Value Range   FIO2 0.21     Delivery systems ROOM AIR     pH, Arterial 7.330 (*) 7.350 - 7.450   pCO2 arterial 29.9 (*) 35.0 -  45.0 mmHg   pO2, Arterial 78.5 (*) 80.0 - 100.0 mmHg    Bicarbonate 15.3 (*) 20.0 - 24.0 mEq/L   TCO2 14.4  0 - 100 mmol/L   Acid-base deficit 9.1 (*) 0.0 - 2.0 mmol/L   O2 Saturation 93.0     Patient temperature 98.6     Collection site RIGHT RADIAL     Drawn by [phone]     Sample type ARTERIAL DRAW     Allens test (pass/fail) PASS  PASS  POCT I-STAT TROPONIN I      Result Value Range   Troponin i, poc 0.38 (*) 0.00 - 0.08 ng/mL   Comment NOTIFIED PHYSICIAN     Comment 3              Dg Chest Port 1 View  06/30/2012  *RADIOLOGY REPORT*  Clinical Data: Cough and shortness of breath.  PORTABLE CHEST - 1 VIEW  Comparison: None.  Findings: There is cardiomegaly and interstitial edema.  Small right effusion and basilar airspace disease are identified.  IMPRESSION: Interstitial pulmonary edema.  Small right effusion and basilar airspace disease likely representing atelectasis.   Original Report Authenticated By: Holley Dexter, M.D.    MDM  Patient with chf on exam and x-Jaydn Fincher.  Troponin elevated at 0.38.  IV lasix ordered.  Cardiology is being consulted.  Remainder of labs pending.   1- dyspnea /cough- patient with cxr c.w. chf- lasix given, bp stable.  Patient tachypneic but sats normal and po2 78.  Patient complained of fever and cough and has wbc 27,300- treated empirically for possible underlying pneumonia. 2- new onset renal failure.  Patient reports normal voiding. 3-hyperkalemia- likely secondary to #2- patient treated with calcium, insulin, d50, and kayexalate. Patient on monitor and continues hemodynamically stable. Patient with repeat potassium 7.4 and insulin and d50 redosed.   4- Elevated transaminase with known cirrhosis.   5- elevated d-dimer- unable to do ct angiogram, doubt value of vq scan.  INR pending but given liver damage, I am hesitant to anticoagulate without known clot.  Plan doppler lower extremities.      Patient care discussed with Dr. Waymon Amato. Patient to be placed in a step down bed on team 4. I have placed a repeat  paged cardiology  I spoke with Dr. Algie Coffer and he will consult on patient  CRITICAL CARE Performed by: Deyvi Bonanno S Total critical care time: 70 Critical care time was exclusive of separately billable procedures and treating other patients. Critical care was necessary to treat or prevent imminent or life-threatening deterioration. Critical care was time spent personally by me on the following activities: development of treatment plan with patient and/or surrogate as well as nursing, discussions with consultants, evaluation of patient's response to treatment, examination of patient, obtaining history from patient or surrogate, ordering and performing treatments and interventions, ordering and review of laboratory studies, ordering and review of radiographic studies, pulse oximetry and re-evaluation of patient's condition.   Hilario Quarry, MD 06/30/12 [phone]

## 2012-06-30 NOTE — Progress Notes (Signed)
*  PRELIMINARY RESULTS* Vascular Ultrasound Lower extremity venous duplex has been completed.  Preliminary findings: Negative for DVT and baker's cyst.  Verbal report to Dr. Rosalia Hammers.   Farrel Demark, RDMS, RVT  06/30/2012, 1:32 PM

## 2012-06-30 NOTE — ED Notes (Signed)
Gave I stat  Troponin critical results to MD Ray

## 2012-06-30 NOTE — ED Notes (Signed)
Per EMS-pt c/o of nasal congestion, cough x3 months, SOB x1 week. Worse this am. NAD at this time.

## 2012-06-30 NOTE — Progress Notes (Signed)
*  PRELIMINARY RESULTS* Echocardiogram 2D Echocardiogram has been performed.  Arthur Woodard 06/30/2012, 4:15 PM

## 2012-07-01 ENCOUNTER — Inpatient Hospital Stay (HOSPITAL_COMMUNITY): Payer: Managed Care, Other (non HMO)

## 2012-07-01 DIAGNOSIS — J309 Allergic rhinitis, unspecified: Secondary | ICD-10-CM

## 2012-07-01 DIAGNOSIS — R059 Cough, unspecified: Secondary | ICD-10-CM | POA: Diagnosis not present

## 2012-07-01 DIAGNOSIS — F1010 Alcohol abuse, uncomplicated: Secondary | ICD-10-CM

## 2012-07-01 DIAGNOSIS — N189 Chronic kidney disease, unspecified: Secondary | ICD-10-CM

## 2012-07-01 DIAGNOSIS — N179 Acute kidney failure, unspecified: Secondary | ICD-10-CM | POA: Diagnosis not present

## 2012-07-01 DIAGNOSIS — D126 Benign neoplasm of colon, unspecified: Secondary | ICD-10-CM

## 2012-07-01 LAB — OSMOLALITY: Osmolality: 296 mOsm/kg (ref 275–300)

## 2012-07-01 LAB — PROTIME-INR
INR: 3.09 — ABNORMAL HIGH (ref 0.00–1.49)
Prothrombin Time: 30.2 seconds — ABNORMAL HIGH (ref 11.6–15.2)

## 2012-07-01 LAB — BASIC METABOLIC PANEL
Calcium: 7.8 mg/dL — ABNORMAL LOW (ref 8.4–10.5)
GFR calc Af Amer: 15 mL/min — ABNORMAL LOW (ref >90)
GFR calc non Af Amer: 13 mL/min — ABNORMAL LOW (ref >90)
Glucose, Bld: 131 mg/dL — ABNORMAL HIGH (ref 70–99)
Potassium: 6.6 mEq/L (ref 3.5–5.1)
Sodium: 120 mEq/L — ABNORMAL LOW (ref 135–145)

## 2012-07-01 LAB — MRSA PCR SCREENING: MRSA by PCR: NEGATIVE

## 2012-07-01 LAB — GLUCOSE, CAPILLARY
Glucose-Capillary: 104 mg/dL — ABNORMAL HIGH (ref 70–99)
Glucose-Capillary: 67 mg/dL — ABNORMAL LOW (ref 70–99)
Glucose-Capillary: 97 mg/dL (ref 70–99)

## 2012-07-01 LAB — COMPREHENSIVE METABOLIC PANEL
ALT: 141 U/L — ABNORMAL HIGH (ref 0–53)
AST: 392 U/L — ABNORMAL HIGH (ref 0–37)
Albumin: 1.4 g/dL — ABNORMAL LOW (ref 3.5–5.2)
Albumin: 1.3 g/dL — ABNORMAL LOW (ref 3.5–5.2)
Alkaline Phosphatase: 427 U/L — ABNORMAL HIGH (ref 39–117)
BUN: 70 mg/dL — ABNORMAL HIGH (ref 6–23)
CO2: 17 mEq/L — ABNORMAL LOW (ref 19–32)
Calcium: 8 mg/dL — ABNORMAL LOW (ref 8.4–10.5)
Chloride: 94 mEq/L — ABNORMAL LOW (ref 96–112)
Creatinine, Ser: 4.75 mg/dL — ABNORMAL HIGH (ref 0.50–1.35)
Creatinine, Ser: 4.95 mg/dL — ABNORMAL HIGH (ref 0.50–1.35)
GFR calc Af Amer: 14 mL/min — ABNORMAL LOW (ref >90)
GFR calc non Af Amer: 13 mL/min — ABNORMAL LOW (ref >90)
Glucose, Bld: 92 mg/dL (ref 70–99)
Potassium: 6.6 mEq/L (ref 3.5–5.1)
Sodium: 121 mEq/L — ABNORMAL LOW (ref 135–145)
Total Bilirubin: 2.9 mg/dL — ABNORMAL HIGH (ref 0.3–1.2)
Total Protein: 6.8 g/dL (ref 6.0–8.3)

## 2012-07-01 LAB — CBC
HCT: 27.1 % — ABNORMAL LOW (ref 39.0–52.0)
Hemoglobin: 10 g/dL — ABNORMAL LOW (ref 13.0–17.0)
MCH: 33.3 pg (ref 26.0–34.0)
MCHC: 36.9 g/dL — ABNORMAL HIGH (ref 30.0–36.0)
MCV: 90.3 fL (ref 78.0–100.0)
RBC: 3 MIL/uL — ABNORMAL LOW (ref 4.22–5.81)

## 2012-07-01 LAB — CK TOTAL AND CKMB (NOT AT ARMC)
CK, MB: 9.7 ng/mL (ref 0.3–4.0)
Relative Index: 1.8 (ref 0.0–2.5)

## 2012-07-01 LAB — IRON AND TIBC: UIBC: <15 ug/dL — ABNORMAL LOW (ref 125–400)

## 2012-07-01 LAB — ABO/RH: ABO/RH(D): B POS

## 2012-07-01 LAB — FOLATE: Folate: 13.1 ng/mL

## 2012-07-01 LAB — TYPE AND SCREEN: ABO/RH(D): B POS

## 2012-07-01 LAB — RETICULOCYTES
RBC.: 2.84 MIL/uL — ABNORMAL LOW (ref 4.22–5.81)
Retic Count, Absolute: 48.3 10*3/uL (ref 19.0–186.0)

## 2012-07-01 LAB — VITAMIN B12: Vitamin B-12: >2000 pg/mL — ABNORMAL HIGH (ref 211–911)

## 2012-07-01 LAB — TROPONIN I: Troponin I: 1.52 ng/mL (ref <0.30)

## 2012-07-01 MED ORDER — ALTEPLASE 2 MG IJ SOLR
2.0000 mg | Freq: Once | INTRAMUSCULAR | Status: DC | PRN
  Filled 2012-07-01: qty 2

## 2012-07-01 MED ORDER — LIDOCAINE HCL (PF) 1 % IJ SOLN: 5.0000 mL | INTRAMUSCULAR | Status: DC | PRN

## 2012-07-01 MED ORDER — SODIUM CHLORIDE 0.9 % IV SOLN: 100.0000 mL | INTRAVENOUS | Status: DC | PRN

## 2012-07-01 MED ORDER — VITAMIN K1 10 MG/ML IJ SOLN
10.0000 mg | Freq: Once | INTRAMUSCULAR | Status: AC
  Administered 2012-07-01: 10 mg via SUBCUTANEOUS
  Filled 2012-07-01: qty 1

## 2012-07-01 MED ORDER — HEPARIN SODIUM (PORCINE) 1000 UNIT/ML DIALYSIS
1000.0000 [IU] | INTRAMUSCULAR | Status: DC | PRN
  Filled 2012-07-01: qty 1

## 2012-07-01 MED ORDER — METOPROLOL TARTRATE 25 MG PO TABS
25.0000 mg | ORAL_TABLET | Freq: Two times a day (BID) | ORAL | Status: DC
  Administered 2012-07-01 – 2012-07-07 (×10): 25 mg via ORAL
  Filled 2012-07-01 (×15): qty 1

## 2012-07-01 MED ORDER — LIDOCAINE-PRILOCAINE 2.5-2.5 % EX CREA
1.0000 "application " | TOPICAL_CREAM | CUTANEOUS | Status: DC | PRN
  Filled 2012-07-01: qty 5

## 2012-07-01 MED ORDER — NEPRO/CARBSTEADY PO LIQD: 237.0000 mL | ORAL | Status: DC | PRN

## 2012-07-01 MED ORDER — HEPARIN SODIUM (PORCINE) 1000 UNIT/ML DIALYSIS
40.0000 [IU]/kg | Freq: Once | INTRAMUSCULAR | Status: DC
  Filled 2012-07-01: qty 3

## 2012-07-01 MED ORDER — SODIUM POLYSTYRENE SULFONATE 15 GM/60ML PO SUSP
45.0000 g | Freq: Once | ORAL | Status: AC
  Administered 2012-07-01: 45 g via ORAL
  Filled 2012-07-01: qty 180

## 2012-07-01 MED ORDER — FUROSEMIDE 10 MG/ML IJ SOLN
200.0000 mg | Freq: Once | INTRAVENOUS | Status: AC
  Administered 2012-07-01: 200 mg via INTRAVENOUS
  Filled 2012-07-01: qty 20

## 2012-07-01 MED ORDER — SODIUM BICARBONATE 8.4 % IV SOLN
50.0000 meq | Freq: Once | INTRAVENOUS | Status: AC
  Filled 2012-07-01: qty 50

## 2012-07-01 MED ORDER — SODIUM BICARBONATE 8.4 % IV SOLN
INTRAVENOUS | Status: AC
  Administered 2012-07-01: 50 meq
  Filled 2012-07-01: qty 50

## 2012-07-01 MED ORDER — PENTAFLUOROPROP-TETRAFLUOROETH EX AERO: 1.0000 "application " | INHALATION_SPRAY | CUTANEOUS | Status: DC | PRN

## 2012-07-01 NOTE — Progress Notes (Signed)
CRITICAL VALUE ALERT  Critical value received:  Serum potassium 6.6  Date of notification:5/14  Time of notification:0750  Critical value read back:yes  Nurse who received alert:  Ermalene Postin  RN  MD notified (1st page):  Dr. Izola Price  Time of first page:  0800  MD notified (2nd page):  Time of second page:  Responding MD:Dr. Izola Price  Time MD responded:  [phone]

## 2012-07-01 NOTE — Procedures (Signed)
Hemodialysis Catheter Insertion Procedure Note Arthur Woodard [id number] 08-28-1960  Procedure: Insertion of Hemodialysis Catheter Indications: Dialysis Access   Procedure Details Consent: Risks of procedure as well as the alternatives and risks of each were explained to the (patient/caregiver).  Consent for procedure obtained. Time Out: Verified patient identification, verified procedure, site/side was marked, verified correct patient position, special equipment/implants available, medications/allergies/relevent history reviewed, required imaging and test results available.  Performed  Maximum sterile technique was used including antiseptics, cap, gloves, gown, hand hygiene, mask and sheet. Skin prep: Chlorhexidine; local anesthetic administered Triple lumen hemodialysis catheter was inserted into right femoral vein due to emergent situation using the Seldinger technique.  Evaluation Blood flow good Complications: No apparent complications Patient did tolerate procedure well. Chest X-ray ordered to verify placement.  CXR: not indic.   Arthur Woodard 07/01/2012

## 2012-07-01 NOTE — Consult Note (Signed)
Subjective:  No chest pain. 2-D echocardiogram yesterday with good LV systolic function. Abnormal cardiac enzymes noted. T max 99.  Objective:  Vital Signs in the last 24 hours: Temp:  [98.2 F (36.8 C)-99 F (37.2 C)] 99 F (37.2 C) (05/14 0400) Pulse Rate:  [89-108] 94 (05/14 0600) Cardiac Rhythm:  [-] Normal sinus rhythm (05/13 2000) Resp:  [18-28] 22 (05/14 0600) BP: (95-150)/(36-70) 111/49 mmHg (05/14 0600) SpO2:  [94 %-100 %] 96 % (05/14 0600) FiO2 (%):  [0 %] 0 % (05/13 1522) Weight:  [71.9 kg (158 lb 8.2 oz)-72.8 kg (160 lb 7.9 oz)] 72.8 kg (160 lb 7.9 oz) (05/14 0400)  Physical Exam: BP Readings from Last 1 Encounters:  07/01/12 111/49     Wt Readings from Last 1 Encounters:  07/01/12 72.8 kg (160 lb 7.9 oz)    Weight change:   HEENT: [place]/AT, Eyes-Brown, PERL, EOMI, Conjunctiva-Pale pink, Sclera-Non-icteric Neck: No JVD, No bruit, Trachea midline. Lungs:  Clearing, Bilateral. Cardiac:  Regular rhythm, normal S1 and S2, no S3.  Abdomen:  Soft, non-tender. Extremities:  No edema present. No cyanosis. No clubbing. CNS: AxOx3, Cranial nerves grossly intact, moves all 4 extremities. Right handed. Skin: Warm and dry.   Intake/Output from previous day: 05/13 0701 - 05/14 0700 In: 630 [P.O.:480; I.V.:150] Out: 258 [Urine:255; Stool:3]    Lab Results: BMET    Component Value Date/Time   NA 121* 07/01/2012 0320   NA 137 11/15/2011 0926   K 6.1* 07/01/2012 0320   K 4.5 11/15/2011 0926   CL 92* 07/01/2012 0320   CL 106 11/15/2011 0926   CO2 17* 07/01/2012 0320   CO2 22 11/15/2011 0926   GLUCOSE 105* 07/01/2012 0320   GLUCOSE 120* 11/15/2011 0926   GLUCOSE 116* 02/10/2006 1102   BUN 70* 07/01/2012 0320   BUN 15.0 11/15/2011 0926   CREATININE 4.75* 07/01/2012 0320   CREATININE 0.9 11/15/2011 0926   CALCIUM 8.0* 07/01/2012 0320   CALCIUM 8.4 11/15/2011 0926   GFRNONAA 13* 07/01/2012 0320   GFRAA 15* 07/01/2012 0320   CBC    Component Value Date/Time   WBC 24.2* 07/01/2012  0320   WBC 3.5* 11/15/2011 0926   RBC 3.00* 07/01/2012 0320   RBC 3.49* 11/15/2011 0926   HGB 10.0* 07/01/2012 0320   HGB 12.6* 11/15/2011 0926   HCT 27.1* 07/01/2012 0320   HCT 36.0* 11/15/2011 0926   PLT 98* 07/01/2012 0320   PLT 69* 11/15/2011 0926   MCV 90.3 07/01/2012 0320   MCV 103.2* 11/15/2011 0926   MCH 33.3 07/01/2012 0320   MCH 36.0* 11/15/2011 0926   MCHC 36.9* 07/01/2012 0320   MCHC 34.9 11/15/2011 0926   RDW 14.6 07/01/2012 0320   RDW 13.6 11/15/2011 0926   LYMPHSABS 2.2 06/30/2012 0923   LYMPHSABS 1.2 11/15/2011 0926   MONOABS 4.1* 06/30/2012 0923   MONOABS 0.6 11/15/2011 0926   EOSABS 0.0 06/30/2012 0923   EOSABS 0.2 11/15/2011 0926   BASOSABS 0.0 06/30/2012 0923   BASOSABS 0.0 11/15/2011 0926   CARDIAC ENZYMES Lab Results  Component Value Date   CKTOTAL 538* 07/01/2012   CKMB 9.7* 07/01/2012   TROPONINI 1.52* 07/01/2012    Scheduled Meds: . aspirin EC  81 mg Oral Daily  . azithromycin  500 mg Intravenous Q24H  . cefTRIAXone (ROCEPHIN)  IV  1 g Intravenous Q24H  . clotrimazole  10 mg Oral 5 X Daily  . folic acid  1 mg Oral Daily  . multivitamin with minerals  1 tablet Oral Daily  . sodium bicarbonate  1,300 mg Oral TID  . sodium chloride  3 mL Intravenous Q12H  . thiamine  100 mg Oral Daily   Or  . thiamine  100 mg Intravenous Daily   Continuous Infusions: . sodium chloride 10 mL/hr at 06/30/12 2101   PRN Meds:.acetaminophen, acetaminophen, albuterol, guaiFENesin-dextromethorphan, LORazepam, LORazepam, ondansetron (ZOFRAN) IV, ondansetron, oxyCODONE  Assessment/Plan: Acute renal failure Chronic anemia  Chronic thrombocytopenia  DM, II  Hypertension  Liver cirrhosis Abnormal cardiac enzymes-from renal failure and or non-cardiac source.  Small dose B-blocker as tolerated.   LOS: 1 day    Orpah Cobb  MD  07/01/2012, 7:45 AM

## 2012-07-01 NOTE — Progress Notes (Signed)
Patient ID: Arthur Woodard, male   DOB: [birth year], 52 y.o.   MRN: [id number]  [facility] PROGRESS NOTE  Arthur Woodard:[id number] DOB: March 26, 1960 DOA: 06/30/2012 PCP: Erlinda Hong, MD  Brief narrative: 52 y.o. male with history of alcoholic cirrhosis, thrombocytopenia, coagulopathy, liver lesions of uncertain nature (elevated alpha-fetoprotein-? HCC-declined liver biopsy), HTN, DM, iron deficiency anemia and splenomegaly presented to the [facility] ED on 06/30/12 with complaints of productive cough of white and clear sputum, associated with subjective fevers and chills, progressively worsening dyspnea that was initially noted with exertion and has progressed to dyspnea at rest. Patient was seen several weeks prior to this admission by his PCP and was treated with Z-Pak. His symptoms have not improved, and he has developed orthopnea, anterior chest pain with coughing, bilateral lower extremity swelling, and abdominal distention. Patient has also noticed decreased urine output, dizziness with standing, generalized pruritus and yellow discoloration of the eyes.   In the ED, sodium 121, potassium 7.4, chloride 92, bicarbonate 17, BUN 65, creatinine 4.91, glucose 67, alkaline phosphatase 428, albumin 1.5, AST 325, ALT 119, troponin 0.38, proBNP 1577, INR 2.7, hemoglobin 11.2, platelets 114 and WBC 27.3. Chest x-ray suggestive of interstitial edema. Patient has received a dose of Kayexalate, insulin, dextrose, calcium, Rocephin and azithromycin. EDP has consulted cardiology. Hospitalist admission requested.  ASSESSMENT AND PLAN: 1. Acute renal failure: still somewhat unclear etiology and possibly secondary to dehydration, ACE inhibitors and NSAIDs versus hepatorenal syndrome. Suggestive of chronic medical renal disease. No significant change in creatinine. Appreciate nephrology assistance, plan on transferring patient to [facility] hospital for hemodialysis. 2. Hyperkalemia: Secondary to  acute renal failure and ACE inhibitor. No significant response after Kayexalate, insulin and dextrose. Plan on hemodialysis today at [facility].  3. Dyspnea: Possibly multifactorial from new onset CHF (no echo available to comment on systolic or diastolic), atelectasis and cannot rule out pneumonia. As per nephrology recommendations, will avoid diuretics given renal failure and possibility of hepatorenal syndrome. 2 D ECHO with normal systolic function and EF 60%. Given uncertainty of possible infectious in etiology and in patient with leukocytosis, we'll continue Rocephin and Zithromax, day #2, followup on sputum analysis, strep pneumo and urine Legionella. 4. Elevated troponin: Likely secondary to acute renal failure and decompensated CHF. Cardiology recommendation is to start low-dose beta blocker as blood pressure able to tolerate. Patient was started on metoprolol 25 mg by mouth twice a day. 5. Hypoglycemia/type II DM: Hold oral hypoglycemics. Frequent and close CBG monitoring and address appropriately. A1c is 5.5. 6. History of alcohol abuse: Per patient and spouse, has not had alcohol for approximately a month. Continue CIWA protocol.  7. Hypertension: Slightly on the soft side, low dose metoprolol started.  8. Hyperlipidemia: Continue to hold statins given worsened LFTs. 9. Alcoholic cirrhosis/coagulopathy/thrombocytopenia/splenomegaly/liver nodules/transaminitis-? HCC: Seems to have decompensated compared to recent visit with GI in February 2014. Alpha-fetoprotein at that time was 23. Will ask for GI consultation.  10. Leukocytosis: Unclear etiology.? Stress margination versus pneumonia. Continue ABX as noted above. Monitor CBC.  11. Anemia:? Chronic disease. Drop in Hg since admission, no indication for blood transfusion at this time. Will check anemia panel. Patient apparently also has iron deficiency anemia and has recently had colonoscopy done which showed polyp in the ascending colon,  status post biopsy with results significant for severe H. Pylori. I will ask the bar or GI to provide recommendations.  12. Anasarca: Secondary to cirrhosis, low albumin and decompensated CHF. Difficult situation, plan on HD  today, monitor daily weights. 13. Hyponatremia: Secondary to cirrhosis and acute renal failure. Follow daily BMP. Management per nephrology.  Consultants:  Nephrology  Cardiology   Procedures/Studies: X-ray Chest Pa And Lateral  07/01/2012   Persistent right pleural effusion. No change in the aeration to right lower lobe.  New opacity within the medial right upper lobe may represent consolidation or loculated pleural fluid.     US Renal  06/30/2012   1.  Both kidneys are normal in size and cortical thickness.  The echogenicity of both kidneys is slightly increased, raising the possibility of chronic medical renal disease.  Suggest clinical correlation.  2.  Negative for hydronephrosis.  3.  Mild perihepatic ascites noted with a micronodular hepatic contour suggesting hepatic cirrhosis.  4.  Right pleural effusion.    Dg Chest Port 1 View 06/30/2012     Interstitial pulmonary edema.  Small right effusion and basilar airspace disease likely representing atelectasis.    Antibiotics:  Zithromax 05/13 -->  Rocephin 05/13 -->  Code Status: Full Family Communication: Pt at bedside Disposition Plan: Home when medically stable  HPI/Subjective: No events overnight.   Objective: Filed Vitals:   07/01/12 0800 07/01/12 0900 07/01/12 1000 07/01/12 1100  BP: 96/43 108/51 120/58 126/54  Pulse: 94 96 95 107  Temp: 98.3 F (36.8 C)     TempSrc: Oral     Resp: 26 30 27 17   Height:      Weight:      SpO2: 97% 97% 95% 97%    Intake/Output Summary (Last 24 hours) at 07/01/12 1120 Last data filed at 07/01/12 1000  Gross per 24 hour  Intake    670 ml  Output    259 ml  Net    411 ml    Exam:   General:  Pt is alert, follows commands appropriately, not in acute  distress, scleral icterus  Cardiovascular: Regular rate and rhythm, S1/S2, SEM 2/6, no rubs, no gallops  Respiratory: Bibasilar rales, no wheezing, no tachypnea   Abdomen: Soft, non tender, distended, bowel sounds present, no guarding  Extremities: +3 bilateral LE pitting edema, pulses DP and PT palpable bilaterally  Neuro: Grossly nonfocal  Data Reviewed: Basic Metabolic Panel:  Recent Labs Lab 06/30/12 0923  06/30/12 1400 06/30/12 1913 06/30/12 2350 07/01/12 0320 07/01/12 0700  NA 121*  < > 127* 119* 120* 121* 123*  K >7.5*  < > 7.5* 7.2* 6.6* 6.1* 6.6*  CL 92*  < > 100 92* 93* 92* 94*  CO2 17*  --   --  16* 16* 17* 17*  GLUCOSE 67*  < > 130* 191* 131* 105* 92  BUN 65*  < > 67* 66* 67* 70* 70*  CREATININE 4.91*  < > 4.50* 4.55* 4.70* 4.75* 4.95*  CALCIUM 8.5  --   --  7.9* 7.8* 8.0* 8.1*  PHOS  --   --   --  6.7*  --  7.0*  --   < > = values in this interval not displayed. Liver Function Tests:  Recent Labs Lab 06/30/12 0923 06/30/12 1913 07/01/12 0320 07/01/12 0700  AST 325*  --  392* 385*  ALT 119*  --  141* 138*  ALKPHOS 428*  --  457* 427*  BILITOT 3.2*  --  2.9* 2.9*  PROT 7.2  --  6.8 6.4  ALBUMIN 1.5* 1.3* 1.4* 1.3*   CBC:  Recent Labs Lab 06/30/12 0923 06/30/12 1338 06/30/12 1400 07/01/12 0320  WBC 27.3*  --   --  24.2*  NEUTROABS 21.0*  --   --   --   HGB 11.2* 12.2* 11.2* 10.0*  HCT 30.4* 36.0* 33.0* 27.1*  MCV 92.4  --   --  90.3  PLT 114*  --   --  98*   Cardiac Enzymes:  Recent Labs Lab 06/30/12 1656 06/30/12 2135 07/01/12 0320  CKTOTAL 494* 499* 538*  CKMB 11.8* 10.6* 9.7*  TROPONINI 1.64* CRITICAL RESULT CALLED 1.52*   CBG:  Recent Labs Lab 06/30/12 1735 06/30/12 2035 06/30/12 2350 07/01/12 0434 07/01/12 0816  GLUCAP 206* 161* 130* 104* 91   MRSA PCR SCREENING     Status: None    06/30/12  3:23 PM      Result Value Range Status   MRSA by PCR NEGATIVE  NEGATIVE Final    Scheduled Meds: . aspirin EC  81 mg Oral  Daily  . azithromycin  500 mg Intravenous Q24H  . cefTRIAXone  IV  1 g Intravenous Q24H  . clotrimazole  10 mg Oral 5 X Daily  . folic acid  1 mg Oral Daily  . furosemide  200 mg Intravenous Once  . metoprolol tartrate  25 mg Oral BID  . multivitami  1 tablet Oral Daily  . sodium bicarbonate  1,300 mg Oral TID  . thiamine  100 mg Oral Daily   Continuous Infusions: . sodium chloride 10 mL/hr at 06/30/12 2101   Debbora Presto, MD  [facility] Pager [phone]  If 7PM-7AM, please contact night-coverage [email or web address] Password [email or web address] 07/01/2012, 11:20 AM   LOS: 1 day

## 2012-07-01 NOTE — Procedures (Signed)
I was present at this session.  I have reviewed the session itself and made appropriate changes.  HD via fem cath.  Short due to low Na and 1st tx  Gottlieb Zuercher L 5/14/20144:31 PM

## 2012-07-01 NOTE — Progress Notes (Signed)
Subjective: Interval History: has complaints weak.  Objective: Vital signs in last 24 hours: Temp:  [98.2 F (36.8 C)-99 F (37.2 C)] 98.3 F (36.8 C) (05/14 0800) Pulse Rate:  [89-107] 95 (05/14 1000) Resp:  [18-30] 27 (05/14 1000) BP: (95-149)/(36-64) 120/58 mmHg (05/14 1000) SpO2:  [94 %-98 %] 95 % (05/14 1000) FiO2 (%):  [0 %] 0 % (05/13 1522) Weight:  [71.9 kg (158 lb 8.2 oz)-72.8 kg (160 lb 7.9 oz)] 72.8 kg (160 lb 7.9 oz) (05/14 0400) Weight change:   Intake/Output from previous day: 05/13 0701 - 05/14 0700 In: 640 [P.O.:480; I.V.:160] Out: 258 [Urine:255; Stool:3] Intake/Output this shift: Total I/O In: 10 [I.V.:10] Out: 1 [Stool:1]  General appearance: icteric Resp: rales bibasilar Cardio: S1, S2 normal and systolic murmur: holosystolic 2/6, blowing at apex GI: distended, pos bs, liver down 6 cm Extremities: edema 3+  Lab Results:  Recent Labs  06/30/12 0923  06/30/12 1400 07/01/12 0320  WBC 27.3*  --   --  24.2*  HGB 11.2*  < > 11.2* 10.0*  HCT 30.4*  < > 33.0* 27.1*  PLT 114*  --   --  98*  < > = values in this interval not displayed. BMET:  Recent Labs  07/01/12 0320 07/01/12 0700  NA 121* 123*  K 6.1* 6.6*  CL 92* 94*  CO2 17* 17*  GLUCOSE 105* 92  BUN 70* 70*  CREATININE 4.75* 4.95*  CALCIUM 8.0* 8.1*   No results found for this basename: PTH,  in the last 72 hours Iron Studies:  Recent Labs  06/30/12 1655  IRON 84  TIBC 121*  FERRITIN 1085*    Studies/Results: US Renal  06/30/2012   *RADIOLOGY REPORT*  Clinical Data: AKI  RENAL/URINARY TRACT ULTRASOUND COMPLETE  Comparison:  MR abdomen 11/04/2011 and abdominal ultrasound 07/04/2010  Findings:  Right Kidney:  The right kidney measures 11.3 cm in sagittal length, normal.  Compared to the adjacent liver, the echogenicity of the right kidney appears increased.  The cortical thickness is maintained.  There is no hydronephrosis or evidence of mass.  Left Kidney:  The left kidney measures  11.5 cm in sagittal length, normal.  The echogenicity of the left kidney appears slightly increased.  There is no hydronephrosis or evidence of mass.  Bladder:  The urinary bladder is only mildly distended at the time imaging.  The bladder wall thickness measures 6.5 mm.  Additional:  Ascites is noted adjacent to the liver.  The imaged portion of the liver demonstrates a mildly micronodular contour, suggestive of cirrhosis.  A right pleural effusion is imaged.  IMPRESSION:  1.  Both kidneys are normal in size and cortical thickness.  The echogenicity of both kidneys is slightly increased, raising the possibility of chronic medical renal disease.  Suggest clinical correlation. 2.  Negative for hydronephrosis. 3.  Mild perihepatic ascites noted with a micronodular hepatic contour suggesting hepatic cirrhosis. 4.  Right pleural effusion.   Original Report Authenticated By: Britta Mccreedy, M.D.   Dg Chest Port 1 View  06/30/2012   *RADIOLOGY REPORT*  Clinical Data: Cough and shortness of breath.  PORTABLE CHEST - 1 VIEW  Comparison: None.  Findings: There is cardiomegaly and interstitial edema.  Small right effusion and basilar airspace disease are identified.  IMPRESSION: Interstitial pulmonary edema.  Small right effusion and basilar airspace disease likely representing atelectasis.   Original Report Authenticated By: Holley Dexter, M.D.    I have reviewed the patient's current medications. Prior to Admission:  Prescriptions prior to admission  Medication Sig Dispense Refill  . atorvastatin (LIPITOR) 20 MG tablet Take 20 mg by mouth Daily.      . cetirizine (ZYRTEC) 10 MG tablet Take 10 mg by mouth daily.      . fluticasone (VERAMYST) 27.5 MCG/SPRAY nasal spray Place 2 sprays into the nose daily.      Marland Kitchen glimepiride (AMARYL) 4 MG tablet Take 4 mg by mouth Daily.      Marland Kitchen guaiFENesin-codeine (ROBITUSSIN AC) 100-10 MG/5ML syrup Take 5 mLs by mouth 4 (four) times daily as needed for cough (cough).         Assessment/Plan: 1 AKI ? CKD results reviewed. Some echodensity in kidneys.  FENA low ?? HRS vs toxic or combination.  Vol xs but bp low, avoid abrupt lowering.  K still ^ , no urine and little response to Kayex so will need dialysis.  Will be cautious with low SNa. 2 ^ K 3 Acidemia will tx with1 amp bicarb which will help SNa and bicarb. Ultimately tx with HD 4 Cirrhosis  5 DM controlled 6 HTN not an issue 7 Anemia P Tx to [facility], HD, bicarb , 1 dose Lasix    LOS: 1 day   Kaj Vasil L 07/01/2012,10:15 AM

## 2012-07-01 NOTE — Consult Note (Signed)
  Vascular Surgery Consultation  Reason for Consult: Insertion hemodialysis catheter  HPI: Arthur Woodard is a 52 y.o. male who presents for evaluation of acute renal failure for insertion hemodialysis catheter. Patient has coagulopathy with known cirrhosis, thrombocytopenia, alcohol abuse, congestive heart failure, and currently has an INR of 3.0. Also has a potassium of 6.6   Past Medical History  Diagnosis Date  . Cirrhosis   . Splenomegaly, congestive, chronic   . Thrombocytopenia   . Liver nodule 05/15/2011  . Allergy   . Anemia   . Diabetes mellitus   . Hypertension    Past Surgical History  Procedure Laterality Date  . Tooth extraction    . Bone marrow biopsy     History   Social History  . Marital Status: Married    Spouse Name: N/A    Number of Children: 2  . Years of Education: N/A   Occupational History  . cook    Social History Main Topics  . Smoking status: Never Smoker   . Smokeless tobacco: Never Used  . Alcohol Use: Yes     Comment: 2 beers/day on and off  . Drug Use: No  . Sexually Active: No   Other Topics Concern  . None   Social History Narrative  . None   Family History  Problem Relation Age of Onset  . Colon cancer Neg Hx   . Esophageal cancer Neg Hx   . Rectal cancer Neg Hx   . Stomach cancer Neg Hx    No Known Allergies Prior to Admission medications   Medication Sig Start Date End Date Taking? Authorizing Provider  atorvastatin (LIPITOR) 20 MG tablet Take 20 mg by mouth Daily. 10/26/11  Yes Historical Provider, MD  cetirizine (ZYRTEC) 10 MG tablet Take 10 mg by mouth daily.   Yes Historical Provider, MD  fluticasone (VERAMYST) 27.5 MCG/SPRAY nasal spray Place 2 sprays into the nose daily.   Yes Historical Provider, MD  glimepiride (AMARYL) 4 MG tablet Take 4 mg by mouth Daily. 04/29/11  Yes Historical Provider, MD  guaiFENesin-codeine (ROBITUSSIN AC) 100-10 MG/5ML syrup Take 5 mLs by mouth 4 (four) times daily as needed for cough  (cough).   Yes Historical Provider, MD     Positive ROS: See history of present illness  All other systems have been reviewed and were otherwise negative with the exception of those mentioned in the HPI and as above.  Physical Exam: Filed Vitals:   07/01/12 1200  BP:   Pulse: 103  Temp:   Resp: 30    Labs reviewed: K6 0.6, INR 3.   Assessment/Plan:  Discuss situation with Dr. Darrick Penna. Nephrology service will treat hyperkalemia and coagulopathy and if INR corrected significantly by tomorrow, we will insert hemodialysis catheter via IJ. Will followup in a.m. with repeat labs   Josephina Gip, MD 07/01/2012 3:39 PM

## 2012-07-02 ENCOUNTER — Encounter (HOSPITAL_COMMUNITY)
Admission: AD | Disposition: A | Discharge status: Home / Self Care | Payer: Self-pay | Source: Home / Self Care | Attending: Internal Medicine

## 2012-07-02 ENCOUNTER — Encounter (HOSPITAL_COMMUNITY): Payer: Self-pay | Admitting: Anesthesiology

## 2012-07-02 DIAGNOSIS — D696 Thrombocytopenia, unspecified: Secondary | ICD-10-CM

## 2012-07-02 DIAGNOSIS — K7689 Other specified diseases of liver: Secondary | ICD-10-CM

## 2012-07-02 DIAGNOSIS — R933 Abnormal findings on diagnostic imaging of other parts of digestive tract: Secondary | ICD-10-CM | POA: Insufficient documentation

## 2012-07-02 DIAGNOSIS — N179 Acute kidney failure, unspecified: Principal | ICD-10-CM

## 2012-07-02 DIAGNOSIS — E119 Type 2 diabetes mellitus without complications: Secondary | ICD-10-CM

## 2012-07-02 DIAGNOSIS — K7030 Alcoholic cirrhosis of liver without ascites: Secondary | ICD-10-CM

## 2012-07-02 LAB — GLUCOSE, CAPILLARY
Glucose-Capillary: 92 mg/dL (ref 70–99)
Glucose-Capillary: 80 mg/dL (ref 70–99)
Glucose-Capillary: 81 mg/dL (ref 70–99)
Glucose-Capillary: 171 mg/dL — ABNORMAL HIGH (ref 70–99)

## 2012-07-02 LAB — CBC
MCH: 33.7 pg (ref 26.0–34.0)
Platelets: 77 10*3/uL — ABNORMAL LOW (ref 150–400)
RBC: 2.64 MIL/uL — ABNORMAL LOW (ref 4.22–5.81)

## 2012-07-02 LAB — RENAL FUNCTION PANEL
Chloride: 97 mEq/L (ref 96–112)
GFR calc Af Amer: 23 mL/min — ABNORMAL LOW (ref >90)
Glucose, Bld: 93 mg/dL (ref 70–99)
Phosphorus: 7.6 mg/dL — ABNORMAL HIGH (ref 2.3–4.6)
Potassium: 4.4 mEq/L (ref 3.5–5.1)
Sodium: 133 mEq/L — ABNORMAL LOW (ref 135–145)

## 2012-07-02 LAB — STREP PNEUMONIAE URINARY ANTIGEN: Strep Pneumo Urinary Antigen: NEGATIVE

## 2012-07-02 LAB — COMPREHENSIVE METABOLIC PANEL
ALT: 152 U/L — ABNORMAL HIGH (ref 0–53)
AST: 390 U/L — ABNORMAL HIGH (ref 0–37)
CO2: 23 mEq/L (ref 19–32)
Calcium: 8.1 mg/dL — ABNORMAL LOW (ref 8.4–10.5)
Sodium: 132 mEq/L — ABNORMAL LOW (ref 135–145)
Total Protein: 6.4 g/dL (ref 6.0–8.3)

## 2012-07-02 LAB — APTT: aPTT: 42 seconds — ABNORMAL HIGH (ref 24–37)

## 2012-07-02 LAB — URINE CULTURE: Culture: NO GROWTH

## 2012-07-02 LAB — POCT I-STAT, CHEM 8
BUN: 88 mg/dL — ABNORMAL HIGH (ref 6–23)
Creatinine, Ser: 5 mg/dL — ABNORMAL HIGH (ref 0.50–1.35)
Sodium: 127 mEq/L — ABNORMAL LOW (ref 135–145)
TCO2: 19 mmol/L (ref 0–100)

## 2012-07-02 LAB — PREPARE FRESH FROZEN PLASMA: Unit division: 0

## 2012-07-02 LAB — LEGIONELLA ANTIGEN, URINE: Legionella Antigen, Urine: NEGATIVE

## 2012-07-02 LAB — HEPATITIS B CORE ANTIBODY, TOTAL: Hep B Core Total Ab: POSITIVE — AB

## 2012-07-02 LAB — PROTIME-INR: INR: 2.33 — ABNORMAL HIGH (ref 0.00–1.49)

## 2012-07-02 SURGERY — INSERTION OF DIALYSIS CATHETER
Anesthesia: Monitor Anesthesia Care | Site: Neck

## 2012-07-02 SURGERY — CANCELLED PROCEDURE

## 2012-07-02 MED ORDER — HEPARIN SODIUM (PORCINE) 1000 UNIT/ML IJ SOLN
INTRAMUSCULAR | Status: AC
  Filled 2012-07-02: qty 1

## 2012-07-02 MED ORDER — LIDOCAINE HCL (PF) 1 % IJ SOLN
INTRAMUSCULAR | Status: AC
  Filled 2012-07-02: qty 30

## 2012-07-02 SURGICAL SUPPLY — 40 items
BAG DECANTER FOR FLEXI CONT (MISCELLANEOUS) ×3 IMPLANT
CATH CANNON HEMO 15F 50CM (CATHETERS) IMPLANT
CATH CANNON HEMO 15FR 19 (HEMODIALYSIS SUPPLIES) IMPLANT
CATH CANNON HEMO 15FR 23CM (HEMODIALYSIS SUPPLIES) ×3 IMPLANT
CATH CANNON HEMO 15FR 31CM (HEMODIALYSIS SUPPLIES) IMPLANT
CATH CANNON HEMO 15FR 32 (HEMODIALYSIS SUPPLIES) IMPLANT
CATH CANNON HEMO 15FR 32CM (HEMODIALYSIS SUPPLIES) IMPLANT
CHLORAPREP W/TINT 26ML (MISCELLANEOUS) ×3 IMPLANT
CLOTH BEACON ORANGE TIMEOUT ST (SAFETY) ×3 IMPLANT
COVER PROBE W GEL 5X96 (DRAPES) IMPLANT
COVER SURGICAL LIGHT HANDLE (MISCELLANEOUS) ×3 IMPLANT
DRAPE C-ARM 42X72 X-RAY (DRAPES) ×3 IMPLANT
DRAPE CHEST BREAST 15X10 FENES (DRAPES) ×3 IMPLANT
GAUZE SPONGE 2X2 8PLY STRL LF (GAUZE/BANDAGES/DRESSINGS) ×2 IMPLANT
GAUZE SPONGE 4X4 16PLY XRAY LF (GAUZE/BANDAGES/DRESSINGS) ×3 IMPLANT
GLOVE BIO SURGEON STRL SZ7.5 (GLOVE) ×3 IMPLANT
GLOVE BIOGEL PI IND STRL 8 (GLOVE) ×2 IMPLANT
GLOVE BIOGEL PI INDICATOR 8 (GLOVE) ×1
GOWN STRL NON-REIN LRG LVL3 (GOWN DISPOSABLE) ×6 IMPLANT
KIT BASIN OR (CUSTOM PROCEDURE TRAY) ×3 IMPLANT
KIT ROOM TURNOVER OR (KITS) ×3 IMPLANT
NDL 18GX1X1/2 (RX/OR ONLY) (NEEDLE) ×1 IMPLANT
NDL HYPO 25GX1X1/2 BEV (NEEDLE) ×1 IMPLANT
NEEDLE 18GX1X1/2 (RX/OR ONLY) (NEEDLE) ×2 IMPLANT
NEEDLE 22X1 1/2 (OR ONLY) (NEEDLE) ×3 IMPLANT
NEEDLE HYPO 25GX1X1/2 BEV (NEEDLE) ×2 IMPLANT
NS IRRIG 1000ML POUR BTL (IV SOLUTION) ×3 IMPLANT
PACK SURGICAL SETUP 50X90 (CUSTOM PROCEDURE TRAY) ×3 IMPLANT
PAD ARMBOARD 7.5X6 YLW CONV (MISCELLANEOUS) ×6 IMPLANT
SPONGE GAUZE 2X2 STER 10/PKG (GAUZE/BANDAGES/DRESSINGS) ×1
SUT ETHILON 3 0 PS 1 (SUTURE) ×3 IMPLANT
SUT VICRYL 4-0 PS2 18IN ABS (SUTURE) ×3 IMPLANT
SYR 20CC LL (SYRINGE) ×6 IMPLANT
SYR 30ML LL (SYRINGE) IMPLANT
SYR 5ML LL (SYRINGE) ×6 IMPLANT
SYR CONTROL 10ML LL (SYRINGE) ×3 IMPLANT
SYRINGE 10CC LL (SYRINGE) ×3 IMPLANT
TOWEL OR 17X24 6PK STRL BLUE (TOWEL DISPOSABLE) ×3 IMPLANT
TOWEL OR 17X26 10 PK STRL BLUE (TOWEL DISPOSABLE) ×3 IMPLANT
WATER STERILE IRR 1000ML POUR (IV SOLUTION) ×3 IMPLANT

## 2012-07-02 NOTE — Consult Note (Signed)
[facility] Consult: 9:51 AM 07/02/2012   Referring Provider: Dr Lenise Arena.  Primary Care Physician:  Erlinda Hong, MD Primary Gastroenterologist:  Dr. Rhea Belton.  Has also seen Dr Wilford Grist at [facility].  Reason for Consultation:  Cirrhosis follow up.   HPI: Arthur Woodard is a 52 y.o. male.  Native of [place].  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 Hep B surface Ag + but no recall of vaccination, was perhaps exposed.   EGD and Colonoscopy 05/2011.  EGD showed Helicobacter pylori infection treated with 2 weeks triple therapy.  No evidence for celiac disease. Colonoscopy revealed 3 tubular adenomas, mild diverticulosis, and small internal hemorrhoids.  Liver nodules have been noted on MRI as far back as 06/2010. He's had periodic MRI since then and nodules.  On latest in 10/2011 there were up to 15 in #, 4 larger ranging 10 x 8 to 16 x 9 mm one of which shows restricted diffusion which is worrisome but no diagnostic for HCCA.  No imaging features specific for hepatocellular carcinoma. Last MRI was 10/2011 and he is overdue for April 2014 MRI and AFP levels as recommended by Dr Gaylyn Rong and Pyrtle. The AFP level in 03/2012 was 23.3.3, compared to 18.3 in 11/2011. Weight was 147#/66.7kg in 03/2012. No ascites on exam then.  Admitted to [facility] on 5/13 with non-purulent cough, dyspnea.  Had completed z pack several weeks previously by PMD for same but failed to improve.   ROS + for oliguria, dizziness, pruritus with bleeding from skin after vigorous scratching, scleral icterus, LE 3+ edema, abdominal distention.  Urine became dark as tea before he developed oliguria.  No diarrhea, abdominal pain, vomiting or nausea. Appetite briefly diminished for a few weeks pta but now improved.   No melena or rectal bleeding. Several loose BMs after Kayexelate.   Labs revealed ARF with BUN/creat to 88/5.0 compared to 15/0.9 in 10/2011. Potassium was 7.0.  Dr Deterding is following.  NSAIDs (was taking Rx Naprosyn 500 mg up to twice daily prn for chest pain associated with cough), ACEI in setting of liver disease were felt to be causes.  There is question of hepato renal syndrome vs toxic or combination of both.  Received Kayexelate, then dialysis yesterday which improved his volume overload, acidemia, tachycardia and hyperkalemia.  Renal ultrasound shows mild peri-hepatic ascites and renal medical disease.   He has never had enough ascites to perform paracentesis.  Has pleural effusion on CXR, LVEF is 60% with only mild LV hypertrophy and no right ventricular issues.  Pro BNP is 1577, Troponins 1.6 to 1.5.  Received 2 FFP on 5/13. PT/INR still rose after that but today show decline.  Hgb went from 13.5 in February 2014 to 11.2 at admission 06/30/12 >  to 10 on 5/14 > to 8.9 today. Platelets ranging 114 to 77. Alk phos in mid 400s, compared to high 200s three and seven months ago. Transaminases also up from baseline 140s/115 to 300s/150s with ETOH pattern. He says he stopped all ETOH 4 weeks PTA.  Prior to that admits to 3 shots of whiskey once per week and 24 to 36 oz beer daily.  Denies any active withdrawal sxs with his abstinence.     Past Medical History  Diagnosis Date  . Cirrhosis   . Splenomegaly, congestive, chronic   .  Thrombocytopenia   . Liver nodule 05/15/2011  . Allergy   . Anemia   . Diabetes mellitus   . Hypertension     Past Surgical History  Procedure Laterality Date  . Tooth extraction    . Bone marrow biopsy      Prior to Admission medications   Medication Sig Start Date End Date Taking? Authorizing Provider  atorvastatin (LIPITOR) 20 MG tablet Take 20 mg by mouth Daily. 10/26/11  Yes Historical Provider, MD  cetirizine (ZYRTEC) 10 MG tablet Take 10 mg by  mouth daily.   Yes Historical Provider, MD  fluticasone (VERAMYST) 27.5 MCG/SPRAY nasal spray Place 2 sprays into the nose daily.   Yes Historical Provider, MD  glimepiride (AMARYL) 4 MG tablet Take 4 mg by mouth Daily. 04/29/11  Yes Historical Provider, MD  guaiFENesin-codeine (ROBITUSSIN AC) 100-10 MG/5ML syrup Take 5 mLs by mouth 4 (four) times daily as needed for cough (cough).   Yes Historical Provider, MD  Naproxen   500 mg                  1 to 2 per day PRN  Scheduled Meds: . aspirin EC  81 mg Oral Daily  . azithromycin  500 mg Intravenous Q24H  . cefTRIAXone (ROCEPHIN)  IV  1 g Intravenous Q24H  . clotrimazole  10 mg Oral 5 X Daily  . folic acid  1 mg Oral Daily  . metoprolol tartrate  25 mg Oral BID  . multivitamin with minerals  1 tablet Oral Daily  . sodium bicarbonate  1,300 mg Oral TID  . sodium chloride  3 mL Intravenous Q12H  . thiamine  100 mg Oral Daily   Infusions: . sodium chloride 10 mL/hr at 06/30/12 2101   PRN Meds: acetaminophen, acetaminophen, albuterol, guaiFENesin-dextromethorphan, LORazepam, LORazepam, ondansetron (ZOFRAN) IV, ondansetron, oxyCODONE   Allergies as of 06/30/2012  . (No Known Allergies)    Family History  Problem Relation Age of Onset  . Colon cancer Neg Hx   . Esophageal cancer Neg Hx   . Rectal cancer Neg Hx   . Stomach cancer Neg Hx     History   Social History  . Marital Status: Married    Spouse Name: N/A    Number of Children: 2  . Years of Education: Environmental consultant degree in Contract[employer]   Occupational History  . Cook at [employer].     Social History Main Topics  . Smoking status: Never Smoker   . Smokeless tobacco: Never Used  . Alcohol Use: Yes     Comment: 2 beers/day on and off  . Drug Use: No  . Sexually Active: No   Has close family support with his wife and Aunt/cousins.   REVIEW OF SYSTEMS: See HPI for pertinents from 12 system review.    PHYSICAL EXAM: Vital signs in last 24  hours: Temp:  [98 F (36.7 C)-99.1 F (37.3 C)] 99.1 F (37.3 C) (05/15 0800) Pulse Rate:  [90-107] 90 (05/15 0800) Resp:  [8-33] 30 (05/15 0800) BP: (86-126)/(36-71) 100/54 mmHg (05/15 0800) SpO2:  [92 %-100 %] 96 % (05/15 0800) Weight:  [69.8 kg (153 lb 14.1 oz)-72.4 kg (159 lb 9.8 oz)] 69.8 kg (153 lb 14.1 oz) (05/15 0500)  General: pleasant, well-appearing, intelligent man.  Comfortable  Head:  No swelling or asymmetry  Eyes:  No icterus Ears:  Not HOH  Nose:  No discharge Mouth:  Clear, moist, pink oral MM.  No lesions Neck:  No JVD, no bruits.  No TMG Lungs:  Diminished right base sounds, no dyspnea, no adventitious sounds Heart: RRR.  No MRG Abdomen:  Tense, distended but not particularly protuberant.  Not tender, slight tympanitic BS.   Rectal: deferred   Musc/Skeltl: no joint swelling or deformity Extremities:  1 + LE edema Neurologic:  Pleasant, oriented x 3. Intelligent with braod vocabulary and excellent command of English.  No tremor or asterixis Skin:  healling burn scar on right forarm Tattoos:  None seen Nodes:  No inguinal adenopathy.    Psych:  In good spirits  Intake/Output from previous day: 05/14 0701 - 05/15 0700 In: 1078.8 [P.O.:300; I.V.:73; Blood:635.8; IV Piggyback:70] Out: 1552 [Urine:550; Stool:2] Intake/Output this shift:    LAB RESULTS:  Recent Labs  06/30/12 0923  06/30/12 1400 07/01/12 0320 07/02/12 0240  WBC 27.3*  --   --  24.2* 25.6*  HGB 11.2*  < > 11.2* 10.0* 8.9*  HCT 30.4*  < > 33.0* 27.1* 23.7*  PLT 114*  --   --  98* 77*  < > = values in this interval not displayed. BMET Lab Results  Component Value Date   NA 132* 07/02/2012   NA 123* 07/01/2012   NA 121* 07/01/2012   K 4.4 07/02/2012   K 6.6* 07/01/2012   K 6.1* 07/01/2012   CL 95* 07/02/2012   CL 94* 07/01/2012   CL 92* 07/01/2012   CO2 23 07/02/2012   CO2 17* 07/01/2012   CO2 17* 07/01/2012   GLUCOSE 88 07/02/2012   GLUCOSE 92 07/01/2012   GLUCOSE 105* 07/01/2012   BUN  53* 07/02/2012   BUN 70* 07/01/2012   BUN 70* 07/01/2012   CREATININE 4.08* 07/02/2012   CREATININE 4.95* 07/01/2012   CREATININE 4.75* 07/01/2012   CALCIUM 8.1* 07/02/2012   CALCIUM 8.1* 07/01/2012   CALCIUM 8.0* 07/01/2012   LFT  Recent Labs  07/01/12 0320 07/01/12 0700 07/02/12 0240  PROT 6.8 6.4 6.4  ALBUMIN 1.4* 1.3* 1.5*  AST 392* 385* 390*  ALT 141* 138* 152*  ALKPHOS 457* 427* 423*  BILITOT 2.9* 2.9* 3.5*   PT/INR Lab Results  Component Value Date   INR 2.33* 07/02/2012   INR 3.09* 07/01/2012   INR 2.75* 06/30/2012   Hepatitis Panel  Recent Labs  06/30/12 1655  HEPBSAG NEGATIVE  HCVAB NEGATIVE    RADIOLOGY STUDIES: X-ray Chest Pa And Lateral  07/01/2012       IMPRESSION:  1.  Persistent right pleural effusion. 2. No change in the aeration to right lower lobe.  New opacity within the medial right upper lobe may represent consolidation or loculated pleural fluid.   Original Report Authenticated By: Signa Kell, M.D.   US Renal 06/30/2012    IMPRESSION:  1.  Both kidneys are normal in size and cortical thickness.  The echogenicity of both kidneys is slightly increased, raising the possibility of chronic medical renal disease.  Suggest clinical correlation. 2.  Negative for hydronephrosis. 3.  Mild perihepatic ascites noted with a micronodular hepatic contour suggesting hepatic cirrhosis. 4.  Right pleural effusion.   Original Report Authenticated By: Britta Mccreedy, M.D.    ENDOSCOPIC STUDIES: 05/2011  EGD Normal esophagus and duodenum Mild antral gastritis  05/2011  Colonoscopy Polyps in ascending and sigmoid colon Diverticulosis in ascending and sigmoid colon  GI Pathology 1. Surgical [P], gastric, bx - SEVERE HELICOBACTER PYLORI GASTRITIS WITH INTESTINAL METAPLASIA. - NO EVIDENCE OF DYSPLASIA OR MALIGNANCY. 2. Surgical [P], duodenum, bx - BENIGN  SMALL BOWEL MUCOSA. NO VILLOUS ATROPHY, INFLAMMATION OR OTHER ABNORMALITIES PRESENT. 3. Surgical [P], ascending  colon, polyp TUBULAR ADENOMA (1 FRAGMENT). NO HIGH GRADE DYSPLASIA OR MALIGNANCY IDENTIFIED. 4. Surgical [P], sigmoid colon, polyp TUBULAR ADENOMA (1 FRAGMENT). NO HIGH GRADE DYSPLASIA OR MALIGNANCY IDENTIFIED.   IMPRESSION: *  Laennec's cirrhosis with coagulopathy, thrombocytopenia, hyponatremia. LFTs worse this admission, transamninase pattern c/w alcoholic hepatitis.  *  Multiple liver nodules, one of which is worrisome, but not diagnostic for HCCA.  Last AFP 03/2012 level slightly higher than previous level, but also not diagnostic for HCCA.  *  Ascites on renal ultrasound, volume described not sufficient for paracentesis.   *  Acute kidney injury:  ? Toxic injury vs HRS vs both.  Dr Deterding following, Dialyzed yesterday.   *  Volume overload with CHF but normal LVEF and right heart function.  sxs improved post HD yesterday.  *  Right pleural effusion and opacity. On Zithromax and Rocephin.  *  Coagulopathy.  Received FFP x 2 on 5/13, Vitamin K 10 mg subq on 5/14.   *  Anemia, iron deficiency, felt related to his gastritis.  *  DM 2.  *  Alcoholism.  Says he's had no ETOH for 4 weeks PTA. *  Hx adenomatous colon polyps, up to date on screening.  *  Hx Gastritis and H Pylori + on biopsy, treated with triple therapy in spring 2013.  Per the consulting MD there is some  ? As to whether this has been eradicated, nothing in hx points to non-eradication.  No longer taking  PPI at home *  Gallstones.   PLAN: *  Per Dr Rhea Belton pt is overdue for MRI to rescreen liver nodules, however should defer this until renal function improves.  In meantime will obtain an AFP level *  Per Dr Lauro Franklin note of 08/2011 should have EGD for variceal screening in 05/2014 but sooner if liver decompensation.  Will let Dr Rhea Belton decide when he wants to repeat EGD.  There is no overt signs of  GI bleeding currently.  *  Once again reinforced and encouraged ETOH avoidance.    LOS: 2 days   Jennye Moccasin  07/02/2012,  9:51 AM Pager: [phone]     I have taken a history, examined the patient and reviewed the chart. I agree with the Advanced Practitioner's note, impression and recommendations.  MRI with contrast, if/when renal function allows, to reevaluate his liver nodules-this can be done as an outpatient when he follows up with Dr. Rhea Belton. Obtain AFP. EGD to screening for varices in 05/2014 unless he decompensates then can be done electively at an earlier time per Dr. Rhea Belton. Could get stool H. pylori antigen to determine H. pylori status-can retreat if this is positive. Avoid etoh and no more than 2g of acetaminphen daily and better to avoid completely if possible. OP GI follow up with Dr. Rhea Belton. GI signing off.  Meryl Dare MD [facility]

## 2012-07-02 NOTE — Progress Notes (Signed)
Subjective:  Feeling better. Heart rate improved post dialysis. T max 99.1 F. Monitor sinus rhythm.  Objective:  Vital Signs in the last 24 hours: Temp:  [98 F (36.7 C)-99.1 F (37.3 C)] 99.1 F (37.3 C) (05/15 0800) Pulse Rate:  [90-107] 94 (05/15 0955) Cardiac Rhythm:  [-] Normal sinus rhythm (05/15 0800) Resp:  [8-33] 30 (05/15 0800) BP: (86-126)/(36-71) 109/62 mmHg (05/15 0955) SpO2:  [92 %-100 %] 96 % (05/15 0800) Weight:  [69.8 kg (153 lb 14.1 oz)-72.4 kg (159 lb 9.8 oz)] 69.8 kg (153 lb 14.1 oz) (05/15 0500)  Physical Exam: BP Readings from Last 1 Encounters:  07/02/12 109/62     Wt Readings from Last 1 Encounters:  07/02/12 69.8 kg (153 lb 14.1 oz)    Weight change: 0.5 kg (1 lb 1.6 oz)  HEENT: [place]/AT, Eyes-Brown, PERL, EOMI, Conjunctiva-Pale pink, Sclera-icteric Neck: No JVD, No bruit, Trachea midline. Lungs:  Clear, Bilateral. Cardiac:  Regular rhythm, normal S1 and S2, no S3. II/VI systolic murmur. Abdomen:  Soft, non-tender. Extremities:  Trace edema present. No cyanosis. No clubbing. CNS: AxOx3, Cranial nerves grossly intact, moves all 4 extremities. Right handed. Skin: Warm and dry.   Intake/Output from previous day: 05/14 0701 - 05/15 0700 In: 1078.8 [P.O.:300; I.V.:73; Blood:635.8; IV Piggyback:70] Out: 1552 [Urine:550; Stool:2]    Lab Results: BMET    Component Value Date/Time   NA 132* 07/02/2012 0240   NA 137 11/15/2011 0926   K 4.4 07/02/2012 0240   K 4.5 11/15/2011 0926   CL 95* 07/02/2012 0240   CL 106 11/15/2011 0926   CO2 23 07/02/2012 0240   CO2 22 11/15/2011 0926   GLUCOSE 88 07/02/2012 0240   GLUCOSE 120* 11/15/2011 0926   GLUCOSE 116* 02/10/2006 1102   BUN 53* 07/02/2012 0240   BUN 15.0 11/15/2011 0926   CREATININE 4.08* 07/02/2012 0240   CREATININE 0.9 11/15/2011 0926   CALCIUM 8.1* 07/02/2012 0240   CALCIUM 8.4 11/15/2011 0926   GFRNONAA 16* 07/02/2012 0240   GFRAA 18* 07/02/2012 0240   CBC    Component Value Date/Time   WBC 25.6*  07/02/2012 0240   WBC 3.5* 11/15/2011 0926   RBC 2.64* 07/02/2012 0240   RBC 3.49* 11/15/2011 0926   HGB 8.9* 07/02/2012 0240   HGB 12.6* 11/15/2011 0926   HCT 23.7* 07/02/2012 0240   HCT 36.0* 11/15/2011 0926   PLT 77* 07/02/2012 0240   PLT 69* 11/15/2011 0926   MCV 89.8 07/02/2012 0240   MCV 103.2* 11/15/2011 0926   MCH 33.7 07/02/2012 0240   MCH 36.0* 11/15/2011 0926   MCHC 37.6* 07/02/2012 0240   MCHC 34.9 11/15/2011 0926   RDW 14.7 07/02/2012 0240   RDW 13.6 11/15/2011 0926   LYMPHSABS 2.2 06/30/2012 0923   LYMPHSABS 1.2 11/15/2011 0926   MONOABS 4.1* 06/30/2012 0923   MONOABS 0.6 11/15/2011 0926   EOSABS 0.0 06/30/2012 0923   EOSABS 0.2 11/15/2011 0926   BASOSABS 0.0 06/30/2012 0923   BASOSABS 0.0 11/15/2011 0926   CARDIAC ENZYMES Lab Results  Component Value Date   CKTOTAL 538* 07/01/2012   CKMB 9.7* 07/01/2012   TROPONINI 1.52* 07/01/2012    Scheduled Meds: . aspirin EC  81 mg Oral Daily  . azithromycin  500 mg Intravenous Q24H  . cefTRIAXone (ROCEPHIN)  IV  1 g Intravenous Q24H  . clotrimazole  10 mg Oral 5 X Daily  . folic acid  1 mg Oral Daily  . metoprolol tartrate  25 mg Oral  BID  . multivitamin with minerals  1 tablet Oral Daily  . sodium bicarbonate  1,300 mg Oral TID  . sodium chloride  3 mL Intravenous Q12H  . thiamine  100 mg Oral Daily   Continuous Infusions: . sodium chloride 10 mL/hr at 06/30/12 2101   PRN Meds:.acetaminophen, acetaminophen, albuterol, guaiFENesin-dextromethorphan, LORazepam, LORazepam, ondansetron (ZOFRAN) IV, ondansetron, oxyCODONE  Assessment/Plan: Acute renal failure  Chronic anemia  Chronic thrombocytopenia  DM, II  Hypertension  Liver cirrhosis  Abnormal cardiac enzymes-from renal failure and or non-cardiac source.  Continue medical treatment   LOS: 2 days    Orpah Cobb  MD  07/02/2012, 10:17 AM

## 2012-07-02 NOTE — Anesthesia Preprocedure Evaluation (Addendum)
Anesthesia Evaluation    Reviewed: Allergy & Precautions, H&P , Patient's Chart, lab work & pertinent test results, reviewed documented beta blocker date and time   History of Anesthesia Complications Negative for: history of anesthetic complications  Airway       Dental   Pulmonary shortness of breath and with exertion,          Cardiovascular hypertension, +CHF  06-30-12 ECHO ------------------------------------------------------------ Study Conclusions  - Left ventricle: The cavity size was normal. There was mild   concentric hypertrophy. Systolic function was normal. The   estimated ejection fraction was in the range of 60% to   65%. Wall motion was normal; there were no regional wall   motion abnormalities. - Pericardium, extracardiac: There was a right pleural   effusion. Transthoracic echocardiography.  M-mode, complete 2D, spectral Doppler, and color Doppler.  Weight:  Weight: 66.8kg. Weight: 147lb.  Blood pressure:     113/54.  Patient status:  Inpatient.  Location:  Bedside.     Neuro/Psych    GI/Hepatic (+) Cirrhosis -    substance abuse  alcohol use,   Endo/Other  diabetes, Type 2  Renal/GU ESRF and DialysisRenal disease     Musculoskeletal   Abdominal   Peds  Hematology  (+) Blood dyscrasia, anemia ,   Anesthesia Other Findings   Reproductive/Obstetrics                          Anesthesia Physical Anesthesia Plan  ASA: III  Anesthesia Plan: MAC   Post-op Pain Management:    Induction:   Airway Management Planned:   Additional Equipment:   Intra-op Plan:   Post-operative Plan:   Informed Consent:   Plan Discussed with: CRNA, Anesthesiologist and Surgeon  Anesthesia Plan Comments:         Anesthesia Quick Evaluation

## 2012-07-02 NOTE — Progress Notes (Signed)
[facility] Progress Note [place] TEAM 1 - Stepdown/ICU TEAM   Arthur Woodard ZOX:[id number] DOB: 09/12/1960 DOA: 06/30/2012 PCP: Erlinda Hong, MD  Brief narrative: 52 year old male patient history of alcoholic cirrhosis and associated sequela. He also has liver lesions of uncertain nature and elevated alpha-fetoprotein but he declined a liver biopsy. He presented to [facility] emergency department 06/30/2012 with complaints of productive cough white and clear sputum and subjective fevers and chills and increasing dyspnea on exertion. When symptoms worsen to dyspnea at rest he felt he needed to seek medical attention. He had been seen several weeks ago by his primary care physician and was treated for an upper respiratory infection with azithromycin. Unfortunately his symptoms have not improved and now he is also developed orthopnea and anterior chest pain with coughing and new bilateral lower sternum the swelling and abdominal distention. In addition to these findings he had noted decreased urinary output, dizziness with standing, generalized pruritus and yellow discoloration of the eyes. In the emergency department he was hyponatremic with a sodium of 121, hyperkalemic with potassium of 7.4 BUN 65 and creatinine 4.91 he was hypoalbuminemic, had moderately elevated LFTs, stable hemoglobin and platelets 114,000. His white count was 27,300. His chest x-ray was suggestive of interstitial edema. The patient was given Kayexalate, insulin, and D50 and calcium in the emergency department. Because of suspected community acquired pneumonia he was empirically given Rocephin and azithromycin. Cardiology was consulted.   Assessment/Plan: Active Problems:   Acute renal failure -multifactorial: hepatorenal syndrome vs meds (ACE I) vs combination -required HD 5/14 with resolution of hyperkalemia, decrease in Scr and decreased volume -right now no further HD indicated so renal holding of on  permacath -DOE/orthopnea has resolved    Volume overload/Anasarca -primarily from cirrhosis but worsened by AKI -better    HYPERTENSION -BP controlled/beta blocker    Alcoholic cirrhosis/Thrombocytopenia/Splenomegaly -stable/mild trend up in TB but coags stable -AST/ALT subtle trend up but suspect 2/2 passive congestion form AKI mediated volume overload -cont CIWA   Leukocytosis -likely an indicator of acute physiologic stress -has consolidation RUL (fluid vs PNA) on CXR 5/13 so will rpt CXR post HD to see if has resolved -UA unremarkable/cx pending   Elevated troponin -due to ARF and demand ischemia -Cards following    HYPERLIPIDEMIA -not on statin due to cirrhosis    Liver nodule -AFP has been elevated since Oct 2013- last reading Feb 2014 was 23.3 -reported has refuse bx- may need to re-explore    Anemia chronic disease -hgb lower than baseline (~12) and appears dilutional -follow    Diabetes mellitus -HgbA1c was 5.5 5/13 -presented with hypoglycemia -on Amaryl pre admit so consider dc this med at dc   DVT prophylaxis: SCD's Code Status: Full Family Communication: Patient and wife Disposition Plan: Transfer to 6700 Isolation: None  Consultants: Nephrology Cardiology  Procedures: Insertion triple lumen hemodialysis catheter right femoral vein  Antibiotics: Zithromax 5/13 >>> Rocephin 5/13 >>>  HPI/Subjective: Gen. alert without any complaints. Denies shortness of breath or chest pain or orthopnea. No abdominal pain   Objective: Blood pressure 109/62, pulse 94, temperature 99.1 F (37.3 C), temperature source Oral, resp. rate 30, height 5\' 3"  (1.6 m), weight 69.8 kg (153 lb 14.1 oz), SpO2 96.00%.  Intake/Output Summary (Last 24 hours) at 07/02/12 1105 Last data filed at 07/02/12 1002  Gross per 24 hour  Intake 981.83 ml  Output   1475 ml  Net -493.17 ml     Exam: General: No acute respiratory distress  Lungs: Clear to auscultation  bilaterally without wheezes or crackles, RA Cardiovascular: Regular rate and rhythm without murmur gallop or rub normal S1 and S2, improving peripheral edema with 1-2+ left lower strategy and trace right lower extremity, 3 cm JVD Abdomen: Nontender, mildly distended with ascites, soft, bowel sounds positive, no rebound, no ascites, no appreciable mass Musculoskeletal: No significant cyanosis, clubbing of bilateral lower extremities Neurological: Alert and oriented x 3, moves all extremities x 4 without focal neurological deficits, CN 2-12 intact  Data Reviewed: Basic Metabolic Panel:  Recent Labs Lab 06/30/12 1400 06/30/12 1913 06/30/12 2350 07/01/12 0320 07/01/12 0700 07/02/12 0240  NA 127* 119* 120* 121* 123* 132*  K 7.5* 7.2* 6.6* 6.1* 6.6* 4.4  CL 100 92* 93* 92* 94* 95*  CO2  --  16* 16* 17* 17* 23  GLUCOSE 130* 191* 131* 105* 92 88  BUN 67* 66* 67* 70* 70* 53*  CREATININE 4.50* 4.55* 4.70* 4.75* 4.95* 4.08*  CALCIUM  --  7.9* 7.8* 8.0* 8.1* 8.1*  PHOS  --  6.7*  --  7.0*  --  7.8*   Liver Function Tests:  Recent Labs Lab 06/30/12 0923 06/30/12 1913 07/01/12 0320 07/01/12 0700 07/02/12 0240  AST 325*  --  392* 385* 390*  ALT 119*  --  141* 138* 152*  ALKPHOS 428*  --  457* 427* 423*  BILITOT 3.2*  --  2.9* 2.9* 3.5*  PROT 7.2  --  6.8 6.4 6.4  ALBUMIN 1.5* 1.3* 1.4* 1.3* 1.5*   No results found for this basename: LIPASE, AMYLASE,  in the last 168 hours No results found for this basename: AMMONIA,  in the last 168 hours CBC:  Recent Labs Lab 06/30/12 0923 06/30/12 1338 06/30/12 1400 07/01/12 0320 07/02/12 0240  WBC 27.3*  --   --  24.2* 25.6*  NEUTROABS 21.0*  --   --   --   --   HGB 11.2* 12.2* 11.2* 10.0* 8.9*  HCT 30.4* 36.0* 33.0* 27.1* 23.7*  MCV 92.4  --   --  90.3 89.8  PLT 114*  --   --  98* 77*   Cardiac Enzymes:  Recent Labs Lab 06/30/12 1656 06/30/12 2135 07/01/12 0320  CKTOTAL 494* 499* 538*  CKMB 11.8* 10.6* 9.7*  TROPONINI 1.64*  CRITICAL RESULT CALLED TO, READ BACK BY AND VERIFIED WITH: 1.52*   BNP (last 3 results)  Recent Labs  06/30/12 0923  PROBNP 1577.0*   CBG:  Recent Labs Lab 07/01/12 0816 07/01/12 2103 07/01/12 2329 07/02/12 0357 07/02/12 0808  GLUCAP 91 67* 97 92 80    Recent Results (from the past 240 hour(s))  MRSA PCR SCREENING     Status: None   Collection Time    06/30/12  3:23 PM      Result Value Range Status   MRSA by PCR NEGATIVE  NEGATIVE Final   Comment:            The GeneXpert MRSA Assay (FDA     approved for NASAL specimens     only), is one component of a     comprehensive MRSA colonization     surveillance program. It is not     intended to diagnose MRSA     infection nor to guide or     monitor treatment for     MRSA infections.  MRSA PCR SCREENING     Status: None   Collection Time    07/01/12  1:18 PM  Result Value Range Status   MRSA by PCR NEGATIVE  NEGATIVE Final   Comment:            The GeneXpert MRSA Assay (FDA     approved for NASAL specimens     only), is one component of a     comprehensive MRSA colonization     surveillance program. It is not     intended to diagnose MRSA     infection nor to guide or     monitor treatment for     MRSA infections.     Studies:  Recent x-ray studies have been reviewed in detail by the Attending Physician  Scheduled Meds:  Reviewed in detail by the Attending Physician   Junious Silk, ANP [facility] Office  [phone] Pager [phone]  On-Call/Text Page:      [email or web address]      password [email or web address]  If 7PM-7AM, please contact night-coverage [email or web address] Password [email or web address] 07/02/2012, 11:05 AM   LOS: 2 days   I have examined the patient, reviewed the chart and modified the above note which I agree with.   Arthur Gasner,MD [phone] 07/02/2012, 3:12 PM

## 2012-07-02 NOTE — Progress Notes (Signed)
Pt transferred to unit 6700 from unit 2600, received report from [facility]. Pt is alert and oriented to staff, call bell, and room. Bed in lowest position. Call bell within reach. Full assessment to Epic. Will continue to monitor. Fayne Norrie, RN

## 2012-07-02 NOTE — Progress Notes (Signed)
Patient transferred to [phone]. Patient status stable at time of transfer. Patient alert and oriented and accompanied by wife. Report called to [place], Charity fundraiser. Nurse tech to transfer.

## 2012-07-02 NOTE — Progress Notes (Signed)
Subjective: Interval History: none.  Objective: Vital signs in last 24 hours: Temp:  [98 F (36.7 C)-99.1 F (37.3 C)] 98.6 F (37 C) (05/15 0358) Pulse Rate:  [91-107] 95 (05/15 0358) Resp:  [8-33] 26 (05/15 0358) BP: (86-126)/(36-71) 107/56 mmHg (05/15 0358) SpO2:  [92 %-100 %] 100 % (05/15 0358) Weight:  [69.8 kg (153 lb 14.1 oz)-72.4 kg (159 lb 9.8 oz)] 69.8 kg (153 lb 14.1 oz) (05/15 0500) Weight change: 0.5 kg (1 lb 1.6 oz)  Intake/Output from previous day: 05/14 0701 - 05/15 0700 In: 1078.8 [P.O.:300; I.V.:73; Blood:635.8; IV Piggyback:70] Out: 1552 [Urine:550; Stool:2] Intake/Output this shift:    General appearance: alert, cooperative and icteric Resp: rales bibasilar Cardio: S1, S2 normal and systolic murmur: holosystolic 2/6, blowing at apex GI: mod distension, liver down 6 cm Extremities: edema 1+  Lab Results:  Recent Labs  07/01/12 0320 07/02/12 0240  WBC 24.2* 25.6*  HGB 10.0* 8.9*  HCT 27.1* 23.7*  PLT 98* 77*   BMET:  Recent Labs  07/01/12 0700 07/02/12 0240  NA 123* 132*  K 6.6* 4.4  CL 94* 95*  CO2 17* 23  GLUCOSE 92 88  BUN 70* 53*  CREATININE 4.95* 4.08*  CALCIUM 8.1* 8.1*    Recent Labs  06/30/12 1655  PTH 55.6   Iron Studies:  Recent Labs  07/01/12 1703  IRON 102  TIBC NOT CALC  FERRITIN 994*    Studies/Results: X-ray Chest Pa And Lateral   07/01/2012   *RADIOLOGY REPORT*  Clinical Data: CHF versus pneumonia  CHEST - 2 VIEW  Comparison: 06/30/2012  Findings: Heart size is normal. There is a right pleural effusion which is similar in volume to the previous exam.  No change in aeration to the right lower lobe.  There is new airspace consolidation versus loculated pleural fluid within the medial left upper lobe.  Left lung is clear.  IMPRESSION:  1.  Persistent right pleural effusion. 2. No change in the aeration to right lower lobe.  New opacity within the medial right upper lobe may represent consolidation or loculated  pleural fluid.   Original Report Authenticated By: Signa Kell, M.D.   US Renal  06/30/2012   *RADIOLOGY REPORT*  Clinical Data: AKI  RENAL/URINARY TRACT ULTRASOUND COMPLETE  Comparison:  MR abdomen 11/04/2011 and abdominal ultrasound 07/04/2010  Findings:  Right Kidney:  The right kidney measures 11.3 cm in sagittal length, normal.  Compared to the adjacent liver, the echogenicity of the right kidney appears increased.  The cortical thickness is maintained.  There is no hydronephrosis or evidence of mass.  Left Kidney:  The left kidney measures 11.5 cm in sagittal length, normal.  The echogenicity of the left kidney appears slightly increased.  There is no hydronephrosis or evidence of mass.  Bladder:  The urinary bladder is only mildly distended at the time imaging.  The bladder wall thickness measures 6.5 mm.  Additional:  Ascites is noted adjacent to the liver.  The imaged portion of the liver demonstrates a mildly micronodular contour, suggestive of cirrhosis.  A right pleural effusion is imaged.  IMPRESSION:  1.  Both kidneys are normal in size and cortical thickness.  The echogenicity of both kidneys is slightly increased, raising the possibility of chronic medical renal disease.  Suggest clinical correlation. 2.  Negative for hydronephrosis. 3.  Mild perihepatic ascites noted with a micronodular hepatic contour suggesting hepatic cirrhosis. 4.  Right pleural effusion.   Original Report Authenticated By: Britta Mccreedy, M.D.  Dg Chest Port 1 View  06/30/2012   *RADIOLOGY REPORT*  Clinical Data: Cough and shortness of breath.  PORTABLE CHEST - 1 VIEW  Comparison: None.  Findings: There is cardiomegaly and interstitial edema.  Small right effusion and basilar airspace disease are identified.  IMPRESSION: Interstitial pulmonary edema.  Small right effusion and basilar airspace disease likely representing atelectasis.   Original Report Authenticated By: Holley Dexter, M.D.    I have reviewed the  patient's current medications.  Assessment/Plan: 1 AKI vol improved but xs yet.  Acid/base and K ok. Will hold off HD and hold off PC at this time.  Making more urine. 2 Anemia drifting down fe ok 3 Cirrhosis coagulopathy, low ptlt 4 DM controlled 5 HTN not an issue P Hold off HD, follow urine vol and chem.    LOS: 2 days   Lalla Laham L 07/02/2012,7:30 AM

## 2012-07-02 NOTE — Preoperative (Signed)
Beta Blockers   Reason not to administer Beta Blockers:Not Applicable 

## 2012-07-03 ENCOUNTER — Inpatient Hospital Stay (HOSPITAL_COMMUNITY): Payer: Managed Care, Other (non HMO)

## 2012-07-03 LAB — COMPREHENSIVE METABOLIC PANEL
BUN: 61 mg/dL — ABNORMAL HIGH (ref 6–23)
Calcium: 7.8 mg/dL — ABNORMAL LOW (ref 8.4–10.5)
GFR calc Af Amer: 28 mL/min — ABNORMAL LOW (ref >90)
Glucose, Bld: 68 mg/dL — ABNORMAL LOW (ref 70–99)
Sodium: 131 mEq/L — ABNORMAL LOW (ref 135–145)
Total Protein: 6.4 g/dL (ref 6.0–8.3)

## 2012-07-03 LAB — GLUCOSE, CAPILLARY
Glucose-Capillary: 79 mg/dL (ref 70–99)
Glucose-Capillary: 52 mg/dL — ABNORMAL LOW (ref 70–99)
Glucose-Capillary: 92 mg/dL (ref 70–99)
Glucose-Capillary: 119 mg/dL — ABNORMAL HIGH (ref 70–99)

## 2012-07-03 LAB — CBC
HCT: 25.5 % — ABNORMAL LOW (ref 39.0–52.0)
Hemoglobin: 9.7 g/dL — ABNORMAL LOW (ref 13.0–17.0)
MCH: 33.8 pg (ref 26.0–34.0)
MCHC: 38 g/dL — ABNORMAL HIGH (ref 30.0–36.0)
RDW: 14.7 % (ref 11.5–15.5)

## 2012-07-03 LAB — PHOSPHORUS: Phosphorus: 6.3 mg/dL — ABNORMAL HIGH (ref 2.3–4.6)

## 2012-07-03 LAB — AFP TUMOR MARKER: AFP-Tumor Marker: 12 ng/mL — ABNORMAL HIGH (ref 0.0–8.0)

## 2012-07-03 MED ORDER — REGADENOSON 0.4 MG/5ML IV SOLN
0.4000 mg | Freq: Once | INTRAVENOUS | Status: AC
  Administered 2012-07-03: 0.4 mg via INTRAVENOUS
  Filled 2012-07-03: qty 5

## 2012-07-03 MED ORDER — AZITHROMYCIN 500 MG PO TABS
500.0000 mg | ORAL_TABLET | Freq: Every day | ORAL | Status: DC
  Administered 2012-07-04 – 2012-07-07 (×4): 500 mg via ORAL
  Filled 2012-07-03 (×4): qty 1

## 2012-07-03 MED ORDER — TECHNETIUM TC 99M SESTAMIBI GENERIC - CARDIOLITE
30.0000 | Freq: Once | INTRAVENOUS | Status: AC | PRN
  Administered 2012-07-03: 30 via INTRAVENOUS

## 2012-07-03 MED ORDER — DEXTROSE 50 % IV SOLN
INTRAVENOUS | Status: AC
  Administered 2012-07-03: 25 mL
  Filled 2012-07-03: qty 50

## 2012-07-03 MED ORDER — TECHNETIUM TC 99M SESTAMIBI GENERIC - CARDIOLITE
10.0000 | Freq: Once | INTRAVENOUS | Status: AC | PRN
  Administered 2012-07-03: 10 via INTRAVENOUS

## 2012-07-03 NOTE — Progress Notes (Signed)
Subjective:  Feeling better. No reversible ischemia on nuclear stress test. EF 70 + %.  Objective:  Vital Signs in the last 24 hours: Temp:  [97.8 F (36.6 C)-99.1 F (37.3 C)] 98.4 F (36.9 C) (05/16 1423) Pulse Rate:  [65-92] 82 (05/16 1423) Cardiac Rhythm:  [-] Normal sinus rhythm (05/16 0800) Resp:  [18] 18 (05/16 0805) BP: (108-122)/(53-71) 113/59 mmHg (05/16 1423) SpO2:  [97 %-99 %] 99 % (05/16 1423) Weight:  [69.8 kg (153 lb 14.1 oz)] 69.8 kg (153 lb 14.1 oz) (05/15 2015)  Physical Exam: BP Readings from Last 1 Encounters:  07/03/12 113/59     Wt Readings from Last 1 Encounters:  07/02/12 69.8 kg (153 lb 14.1 oz)    Weight change: -2.6 kg (-5 lb 11.7 oz)  HEENT: [place]/AT, Eyes-Brown, PERL, EOMI, Conjunctiva-Pale pink, Sclera-Non-icteric Neck: No JVD, No bruit, Trachea midline. Lungs:  Clear, Bilateral. Cardiac:  Regular rhythm, normal S1 and S2, no S3. II/VI systolic murmur Abdomen:  Soft, non-tender. Extremities:  Trace edema present. No cyanosis. No clubbing. CNS: AxOx3, Cranial nerves grossly intact, moves all 4 extremities. Right handed. Skin: Warm and dry.   Intake/Output from previous day: 05/15 0701 - 05/16 0700 In: 1103 [P.O.:800; I.V.:3; IV Piggyback:300] Out: 495 [Urine:495]    Lab Results: BMET    Component Value Date/Time   NA 131* 07/03/2012 0601   NA 137 11/15/2011 0926   K 4.2 07/03/2012 0601   K 4.5 11/15/2011 0926   CL 96 07/03/2012 0601   CL 106 11/15/2011 0926   CO2 23 07/03/2012 0601   CO2 22 11/15/2011 0926   GLUCOSE 68* 07/03/2012 0601   GLUCOSE 120* 11/15/2011 0926   GLUCOSE 116* 02/10/2006 1102   BUN 61* 07/03/2012 0601   BUN 15.0 11/15/2011 0926   CREATININE 2.85* 07/03/2012 0601   CREATININE 0.9 11/15/2011 0926   CALCIUM 7.8* 07/03/2012 0601   CALCIUM 8.4 11/15/2011 0926   GFRNONAA 24* 07/03/2012 0601   GFRAA 28* 07/03/2012 0601   CBC    Component Value Date/Time   WBC 20.9* 07/03/2012 0601   WBC 3.5* 11/15/2011 0926   RBC 2.87* 07/03/2012  0601   RBC 3.49* 11/15/2011 0926   HGB 9.7* 07/03/2012 0601   HGB 12.6* 11/15/2011 0926   HCT 25.5* 07/03/2012 0601   HCT 36.0* 11/15/2011 0926   PLT 71* 07/03/2012 0601   PLT 69* 11/15/2011 0926   MCV 88.9 07/03/2012 0601   MCV 103.2* 11/15/2011 0926   MCH 33.8 07/03/2012 0601   MCH 36.0* 11/15/2011 0926   MCHC 38.0* 07/03/2012 0601   MCHC 34.9 11/15/2011 0926   RDW 14.7 07/03/2012 0601   RDW 13.6 11/15/2011 0926   LYMPHSABS 2.2 06/30/2012 0923   LYMPHSABS 1.2 11/15/2011 0926   MONOABS 4.1* 06/30/2012 0923   MONOABS 0.6 11/15/2011 0926   EOSABS 0.0 06/30/2012 0923   EOSABS 0.2 11/15/2011 0926   BASOSABS 0.0 06/30/2012 0923   BASOSABS 0.0 11/15/2011 0926   CARDIAC ENZYMES Lab Results  Component Value Date   CKTOTAL 538* 07/01/2012   CKMB 9.7* 07/01/2012   TROPONINI 1.52* 07/01/2012    Scheduled Meds: . aspirin EC  81 mg Oral Daily  . azithromycin  500 mg Intravenous Q24H  . cefTRIAXone (ROCEPHIN)  IV  1 g Intravenous Q24H  . clotrimazole  10 mg Oral 5 X Daily  . folic acid  1 mg Oral Daily  . metoprolol tartrate  25 mg Oral BID  . multivitamin with minerals  1  tablet Oral Daily  . sodium bicarbonate  1,300 mg Oral TID  . sodium chloride  3 mL Intravenous Q12H  . thiamine  100 mg Oral Daily   Continuous Infusions: . sodium chloride 10 mL/hr at 06/30/12 2101   PRN Meds:.acetaminophen, acetaminophen, albuterol, guaiFENesin-dextromethorphan, ondansetron (ZOFRAN) IV, ondansetron, oxyCODONE  Assessment/Plan: Acute renal failure  Chronic anemia  Chronic thrombocytopenia  DM, II  Hypertension  Liver cirrhosis  Abnormal cardiac enzymes-from renal failure and or non-cardiac source.   Continue medical treatment with no ischemia on stress test. Will sign off for now. F/U two weeks post discharge.    LOS: 3 days    Orpah Cobb  MD  07/03/2012, 4:01 PM

## 2012-07-03 NOTE — Progress Notes (Signed)
Chart reviewed.  [facility] Progress Note [place] TEAM 1 - Stepdown/ICU TEAM   Arthur Woodard UJW:[id number] DOB: [birth year] DOA: 06/30/2012 PCP: Erlinda Hong, MD  Brief narrative: 52 year old male patient history of alcoholic cirrhosis and associated sequela. He also has liver lesions of uncertain nature and elevated alpha-fetoprotein but he declined a liver biopsy. He presented to [facility] long emergency department 06/30/2012 with complaints of productive cough white and clear sputum and subjective fevers and chills and increasing dyspnea on exertion. When symptoms worsen to dyspnea at rest he felt he needed to seek medical attention. He had been seen several weeks ago by his primary care physician and was treated for an upper respiratory infection with azithromycin. Unfortunately his symptoms have not improved and now he is also developed orthopnea and anterior chest pain with coughing and new bilateral lower sternum the swelling and abdominal distention. In addition to these findings he had noted decreased urinary output, dizziness with standing, generalized pruritus and yellow discoloration of the eyes. In the emergency department he was hyponatremic with a sodium of 121, hyperkalemic with potassium of 7.4 BUN 65 and creatinine 4.91 he was hypoalbuminemic, had moderately elevated LFTs, stable hemoglobin and platelets 114,000. His white count was 27,300. His chest x-ray was suggestive of interstitial edema. The patient was given Kayexalate, insulin, and D50 and calcium in the emergency department. Because of suspected community acquired pneumonia he was empirically given Rocephin and azithromycin. Cardiology was consulted.   Assessment/Plan: Active Problems:   Acute renal failure -multifactorial: hepatorenal syndrome vs meds (ACE I) vs combination -required HD 5/14 with resolution of hyperkalemia, decrease in Scr and decreased volume -right now no further HD indicated so renal  holding of on permacath -DOE/orthopnea has resolved    Volume overload/Anasarca -primarily from cirrhosis but worsened by AKI -better    HYPERTENSION -BP controlled/beta blocker    Alcoholic cirrhosis/Thrombocytopenia/Splenomegaly    Leukocytosis Continue abx for possible CAP. Change azithro to PO   Elevated troponin Nuclear stress test shows no ischemia and normal EF    HYPERLIPIDEMIA -not on statin due to cirrhosis    Liver nodule Refused bx    Anemia chronic disease     Diabetes mellitus -HgbA1c was 5.5 5/13 -presented with hypoglycemia -on Amaryl pre admit so consider dc this med at dc   DVT prophylaxis: SCD's Code Status: Full Family Communication: Patient and wife Disposition Plan: Transfer to [address] Isolation: None  Consultants: Nephrology Cardiology  Procedures: Insertion triple lumen hemodialysis catheter right femoral vein  Antibiotics: Zithromax 5/13 >>> Rocephin 5/13 >>>  HPI/Subjective: Gen. alert without any complaints. Denies shortness of breath or chest pain or orthopnea. No abdominal pain   Objective: Blood pressure 113/59, pulse 82, temperature 98.4 F (36.9 C), temperature source Oral, resp. rate 18, height 5\' 3"  (1.6 m), weight 69.8 kg (153 lb 14.1 oz), SpO2 99.00%.  Intake/Output Summary (Last 24 hours) at 07/03/12 1800 Last data filed at 07/03/12 1514  Gross per 24 hour  Intake   1030 ml  Output    420 ml  Net    610 ml     Exam: General: No acute respiratory distress Lungs: Clear to auscultation bilaterally without wheezes or crackles, RA Cardiovascular: Regular rate and rhythm without murmur gallop or rub normal S1 and S2, improving peripheral edema with 1-2+ left lower strategy and trace right lower extremity, 3 cm JVD Abdomen: Nontender, mildly distended with ascites, soft, bowel sounds positive, no rebound, no ascites, no appreciable mass Musculoskeletal: No significant cyanosis,  clubbing of bilateral lower  extremities Neurological: Alert and oriented x 3, moves all extremities x 4 without focal neurological deficits, CN 2-12 intact  Data Reviewed: Basic Metabolic Panel:  Recent Labs Lab 06/30/12 1913  07/01/12 0320 07/01/12 0700 07/02/12 0240 07/02/12 1547 07/03/12 0601  NA 119*  < > 121* 123* 132* 133* 131*  K 7.2*  < > 6.1* 6.6* 4.4 4.4 4.2  CL 92*  < > 92* 94* 95* 97 96  CO2 16*  < > 17* 17* 23 24 23   GLUCOSE 191*  < > 105* 92 88 93 68*  BUN 66*  < > 70* 70* 53* 55* 61*  CREATININE 4.55*  < > 4.75* 4.95* 4.08* 3.30* 2.85*  CALCIUM 7.9*  < > 8.0* 8.1* 8.1* 8.0* 7.8*  PHOS 6.7*  --  7.0*  --  7.8* 7.6* 6.3*  < > = values in this interval not displayed. Liver Function Tests:  Recent Labs Lab 06/30/12 0923  07/01/12 0320 07/01/12 0700 07/02/12 0240 07/02/12 1547 07/03/12 0601  AST 325*  --  392* 385* 390*  --  394*  ALT 119*  --  141* 138* 152*  --  146*  ALKPHOS 428*  --  457* 427* 423*  --  469*  BILITOT 3.2*  --  2.9* 2.9* 3.5*  --  3.7*  PROT 7.2  --  6.8 6.4 6.4  --  6.4  ALBUMIN 1.5*  < > 1.4* 1.3* 1.5* 1.6* 1.4*  < > = values in this interval not displayed. No results found for this basename: LIPASE, AMYLASE,  in the last 168 hours No results found for this basename: AMMONIA,  in the last 168 hours CBC:  Recent Labs Lab 06/30/12 0923 06/30/12 1338 06/30/12 1400 07/01/12 0320 07/02/12 0240 07/03/12 0601  WBC 27.3*  --   --  24.2* 25.6* 20.9*  NEUTROABS 21.0*  --   --   --   --   --   HGB 11.2* 12.2* 11.2* 10.0* 8.9* 9.7*  HCT 30.4* 36.0* 33.0* 27.1* 23.7* 25.5*  MCV 92.4  --   --  90.3 89.8 88.9  PLT 114*  --   --  98* 77* 71*   Cardiac Enzymes:  Recent Labs Lab 06/30/12 1656 06/30/12 2135 07/01/12 0320  CKTOTAL 494* 499* 538*  CKMB 11.8* 10.6* 9.7*  TROPONINI 1.64* CRITICAL RESULT CALLED TO, READ BACK BY AND VERIFIED WITH: 1.52*   BNP (last 3 results)  Recent Labs  06/30/12 0923  PROBNP 1577.0*   CBG:  Recent Labs Lab 07/02/12 2019  07/03/12 0004 07/03/12 0425 07/03/12 0739 07/03/12 1346  GLUCAP 171* 150* 79 52* 92    Recent Results (from the past 240 hour(s))  URINE CULTURE     Status: None   Collection Time    06/30/12 10:36 AM      Result Value Range Status   Specimen Description URINE, CLEAN CATCH   Final   Special Requests NONE   Final   Culture  Setup Time 07/01/2012 01:55   Final   Colony Count NO GROWTH   Final   Culture NO GROWTH   Final   Report Status 07/02/2012 FINAL   Final  MRSA PCR SCREENING     Status: None   Collection Time    06/30/12  3:23 PM      Result Value Range Status   MRSA by PCR NEGATIVE  NEGATIVE Final   Comment:  The GeneXpert MRSA Assay (FDA     approved for NASAL specimens     only), is one component of a     comprehensive MRSA colonization     surveillance program. It is not     intended to diagnose MRSA     infection nor to guide or     monitor treatment for     MRSA infections.  MRSA PCR SCREENING     Status: None   Collection Time    07/01/12  1:18 PM      Result Value Range Status   MRSA by PCR NEGATIVE  NEGATIVE Final   Comment:            The GeneXpert MRSA Assay (FDA     approved for NASAL specimens     only), is one component of a     comprehensive MRSA colonization     surveillance program. It is not     intended to diagnose MRSA     infection nor to guide or     monitor treatment for     MRSA infections.  CULTURE, BLOOD (ROUTINE X 2)     Status: None   Collection Time    07/01/12  5:05 PM      Result Value Range Status   Specimen Description BLOOD RIGHT FEMORAL ARTERY   Final   Special Requests     Final   Value: BOTTLES DRAWN AEROBIC AND ANAEROBIC 10CC  PT ON ZITHROMAX,ROCEPHIN   Culture  Setup Time 07/01/2012 23:53   Final   Culture     Final   Value:        BLOOD CULTURE RECEIVED NO GROWTH TO DATE CULTURE WILL BE HELD FOR 5 DAYS BEFORE ISSUING A FINAL NEGATIVE REPORT   Report Status PENDING   Incomplete  CULTURE, BLOOD (ROUTINE X  2)     Status: None   Collection Time    07/02/12  2:30 AM      Result Value Range Status   Specimen Description BLOOD LEFT ARM   Final   Special Requests BOTTLES DRAWN AEROBIC ONLY 10CC   Final   Culture  Setup Time 07/02/2012 09:29   Final   Culture     Final   Value:        BLOOD CULTURE RECEIVED NO GROWTH TO DATE CULTURE WILL BE HELD FOR 5 DAYS BEFORE ISSUING A FINAL NEGATIVE REPORT   Report Status PENDING   Incomplete  CULTURE, BLOOD (ROUTINE X 2)     Status: None   Collection Time    07/02/12  2:45 AM      Result Value Range Status   Specimen Description BLOOD RIGHT ARM   Final   Special Requests     Final   Value: BOTTLES DRAWN AEROBIC AND ANAEROBIC 6CC BLUE,4CC RED   Culture  Setup Time 07/02/2012 09:30   Final   Culture     Final   Value:        BLOOD CULTURE RECEIVED NO GROWTH TO DATE CULTURE WILL BE HELD FOR 5 DAYS BEFORE ISSUING A FINAL NEGATIVE REPORT   Report Status PENDING   Incomplete     If 7PM-7AM, please contact night-coverage [email or web address] Password [email or web address] 07/03/2012, 6:00 PM   LOS: 3 days   Kalleigh Harbor L,MD [phone] 07/03/2012, 6:00 PM

## 2012-07-03 NOTE — Progress Notes (Signed)
Subjective: Interval History: none.  Objective: Vital signs in last 24 hours: Temp:  [97.9 F (36.6 C)-99.1 F (37.3 C)] 99.1 F (37.3 C) (05/16 0431) Pulse Rate:  [67-94] 85 (05/16 0431) Resp:  [18-30] 18 (05/16 0431) BP: (100-114)/(54-64) 114/62 mmHg (05/16 0431) SpO2:  [93 %-98 %] 97 % (05/16 0431) Weight:  [69.8 kg (153 lb 14.1 oz)] 69.8 kg (153 lb 14.1 oz) (05/15 2015) Weight change: -2.6 kg (-5 lb 11.7 oz)  Intake/Output from previous day: 05/15 0701 - 05/16 0700 In: 1103 [P.O.:800; I.V.:3; IV Piggyback:300] Out: 495 [Urine:495] Intake/Output this shift:    General appearance: cooperative and icteric Resp: rales RLL Cardio: S1, S2 normal and systolic murmur: holosystolic 2/6, blowing at apex GI: distended, pos bs,  Extremities: edema 2+  Lab Results:  Recent Labs  07/02/12 0240 07/03/12 0601  WBC 25.6* 20.9*  HGB 8.9* 9.7*  HCT 23.7* 25.5*  PLT 77* 71*   BMET:  Recent Labs  07/02/12 1547 07/03/12 0601  NA 133* 131*  K 4.4 4.2  CL 97 96  CO2 24 23  GLUCOSE 93 68*  BUN 55* 61*  CREATININE 3.30* 2.85*  CALCIUM 8.0* 7.8*    Recent Labs  06/30/12 1655  PTH 55.6   Iron Studies:  Recent Labs  07/01/12 1703  IRON 102  TIBC NOT CALC  FERRITIN 994*    Studies/Results: X-ray Chest Pa And Lateral   07/01/2012   *RADIOLOGY REPORT*  Clinical Data: CHF versus pneumonia  CHEST - 2 VIEW  Comparison: 06/30/2012  Findings: Heart size is normal. There is a right pleural effusion which is similar in volume to the previous exam.  No change in aeration to the right lower lobe.  There is new airspace consolidation versus loculated pleural fluid within the medial left upper lobe.  Left lung is clear.  IMPRESSION:  1.  Persistent right pleural effusion. 2. No change in the aeration to right lower lobe.  New opacity within the medial right upper lobe may represent consolidation or loculated pleural fluid.   Original Report Authenticated By: Signa Kell, M.D.     I have reviewed the patient's current medications.  Assessment/Plan: 1 AKI making urine, appears some .  Improving Cr 1 day off HD, indic some recovery of function.  Still vol xs, mild acidemia and S Na creeping down all c/w AKI and ^ADH with liver disease.  If better in am will remove cath. 2 Cirrhosis major issue and will limit outcomes 3 DM controlled 4 Anemia stabel 5 Pos cardiac enz P follow chem, fluid restrict, control bs.    LOS: 3 days   Arthur Woodard L 07/03/2012,7:55 AM

## 2012-07-03 NOTE — Progress Notes (Signed)
Utilization review completed.  

## 2012-07-03 NOTE — Progress Notes (Signed)
Pt CBG was 52.  Patient was NPO. Patient is symptomatic.  Administered 1/2 amp. of D50 via IVP.  Will recheck after 15 min to make sure blood sugar is coming up.

## 2012-07-04 DIAGNOSIS — D689 Coagulation defect, unspecified: Secondary | ICD-10-CM | POA: Diagnosis present

## 2012-07-04 DIAGNOSIS — E162 Hypoglycemia, unspecified: Secondary | ICD-10-CM

## 2012-07-04 DIAGNOSIS — D649 Anemia, unspecified: Secondary | ICD-10-CM

## 2012-07-04 LAB — COMPREHENSIVE METABOLIC PANEL
Alkaline Phosphatase: 484 U/L — ABNORMAL HIGH (ref 39–117)
BUN: 71 mg/dL — ABNORMAL HIGH (ref 6–23)
Chloride: 97 mEq/L (ref 96–112)
Creatinine, Ser: 2.69 mg/dL — ABNORMAL HIGH (ref 0.50–1.35)
GFR calc Af Amer: 30 mL/min — ABNORMAL LOW (ref >90)
GFR calc non Af Amer: 26 mL/min — ABNORMAL LOW (ref >90)
Glucose, Bld: 77 mg/dL (ref 70–99)
Potassium: 4.3 mEq/L (ref 3.5–5.1)
Total Bilirubin: 4.1 mg/dL — ABNORMAL HIGH (ref 0.3–1.2)

## 2012-07-04 LAB — GLUCOSE, CAPILLARY
Glucose-Capillary: 84 mg/dL (ref 70–99)
Glucose-Capillary: 49 mg/dL — ABNORMAL LOW (ref 70–99)
Glucose-Capillary: 72 mg/dL (ref 70–99)
Glucose-Capillary: 116 mg/dL — ABNORMAL HIGH (ref 70–99)
Glucose-Capillary: 120 mg/dL — ABNORMAL HIGH (ref 70–99)

## 2012-07-04 LAB — CBC
HCT: 25.1 % — ABNORMAL LOW (ref 39.0–52.0)
MCH: 33.9 pg (ref 26.0–34.0)
MCV: 88.7 fL (ref 78.0–100.0)
Platelets: 68 10*3/uL — ABNORMAL LOW (ref 150–400)
RBC: 2.83 MIL/uL — ABNORMAL LOW (ref 4.22–5.81)
WBC: 19.1 10*3/uL — ABNORMAL HIGH (ref 4.0–10.5)

## 2012-07-04 LAB — MAGNESIUM: Magnesium: 2.3 mg/dL (ref 1.5–2.5)

## 2012-07-04 MED ORDER — BENZONATATE 100 MG PO CAPS
100.0000 mg | ORAL_CAPSULE | Freq: Three times a day (TID) | ORAL | Status: DC
  Administered 2012-07-04 – 2012-07-07 (×9): 100 mg via ORAL
  Filled 2012-07-04 (×11): qty 1

## 2012-07-04 MED ORDER — PHYTONADIONE 5 MG PO TABS
10.0000 mg | ORAL_TABLET | Freq: Once | ORAL | Status: AC
  Administered 2012-07-04: 10 mg via ORAL
  Filled 2012-07-04: qty 2

## 2012-07-04 NOTE — Progress Notes (Addendum)
[facility]  ANDRAY ASSEFA MVH:[id number] DOB: 1960-10-29 DOA: 06/30/2012 PCP: Erlinda Hong, MD  Assessment/Plan: Active Problems:   Acute renal failure Resolving    Volume overload/Anasarca improved    HYPERTENSION -BP controlled/beta blocker    Alcoholic cirrhosis/Thrombocytopenia/Splenomegaly  Continue abx for possible CAP. Change azithro to PO. Add tessalon   Elevated troponin Nuclear stress test shows no ischemia and normal EF    HYPERLIPIDEMIA -not on statin due to cirrhosis    Liver nodule Refused bx    Anemia chronic disease  Periodic hypoglycemia due to liver ds.  Cont monitoring  Coagulopathy secondary to liver ds, abx: getting vit K and FFP for femoral cath removal  Code Status: Full Family Communication: Patient and wife Disposition Plan: Transfer to [address] Isolation: None  Consultants: Nephrology Cardiology  Procedures: Insertion triple lumen hemodialysis catheter right femoral vein  Antibiotics: Zithromax 5/13 >>> Rocephin 5/13 >>>  HPI/Subjective: Cough/congestion. Feels better   Objective: Blood pressure 114/61, pulse 81, temperature 98.4 F (36.9 C), temperature source Oral, resp. rate 17, height 5\' 3"  (1.6 m), weight 69.8 kg (153 lb 14.1 oz), SpO2 100.00%.  Intake/Output Summary (Last 24 hours) at 07/04/12 1803 Last data filed at 07/04/12 1645  Gross per 24 hour  Intake   3635 ml  Output    600 ml  Net   3035 ml   Exam: General: No acute respiratory distress Lungs: Clear to auscultation bilaterally without wheezes or crackles, RA Cardiovascular: Regular rate and rhythm without murmur gallop or rub normal S1 and S2, improving peripheral edema with 1-2+ left lower strategy and trace right lower extremity, 3 cm JVD Abdomen: Nontender, mildly distended with ascites, soft, bowel sounds positive, no rebound, no ascites, no appreciable mass Musculoskeletal: No significant cyanosis, clubbing of bilateral lower  extremities Neurological: Alert and oriented x 3, moves all extremities x 4 without focal neurological deficits, CN 2-12 intact  Data Reviewed: Basic Metabolic Panel:  Recent Labs Lab 07/01/12 0320 07/01/12 0700 07/02/12 0240 07/02/12 1547 07/03/12 0601 07/04/12 0615  NA 121* 123* 132* 133* 131* 132*  K 6.1* 6.6* 4.4 4.4 4.2 4.3  CL 92* 94* 95* 97 96 97  CO2 17* 17* 23 24 23 23   GLUCOSE 105* 92 88 93 68* 77  BUN 70* 70* 53* 55* 61* 71*  CREATININE 4.75* 4.95* 4.08* 3.30* 2.85* 2.69*  CALCIUM 8.0* 8.1* 8.1* 8.0* 7.8* 7.8*  MG  --   --   --   --   --  2.3  PHOS 7.0*  --  7.8* 7.6* 6.3* 5.8*   Liver Function Tests:  Recent Labs Lab 07/01/12 0320 07/01/12 0700 07/02/12 0240 07/02/12 1547 07/03/12 0601 07/04/12 0615  AST 392* 385* 390*  --  394* 364*  ALT 141* 138* 152*  --  146* 134*  ALKPHOS 457* 427* 423*  --  469* 484*  BILITOT 2.9* 2.9* 3.5*  --  3.7* 4.1*  PROT 6.8 6.4 6.4  --  6.4 6.4  ALBUMIN 1.4* 1.3* 1.5* 1.6* 1.4* 1.3*   No results found for this basename: LIPASE, AMYLASE,  in the last 168 hours No results found for this basename: AMMONIA,  in the last 168 hours CBC:  Recent Labs Lab 06/30/12 0923  06/30/12 1400 07/01/12 0320 07/02/12 0240 07/03/12 0601 07/04/12 0615  WBC 27.3*  --   --  24.2* 25.6* 20.9* 19.1*  NEUTROABS 21.0*  --   --   --   --   --   --   HGB  11.2*  < > 11.2* 10.0* 8.9* 9.7* 9.6*  HCT 30.4*  < > 33.0* 27.1* 23.7* 25.5* 25.1*  MCV 92.4  --   --  90.3 89.8 88.9 88.7  PLT 114*  --   --  98* 77* 71* 68*  < > = values in this interval not displayed. Cardiac Enzymes:  Recent Labs Lab 06/30/12 1656 06/30/12 2135 07/01/12 0320  CKTOTAL 494* 499* 538*  CKMB 11.8* 10.6* 9.7*  TROPONINI 1.64* CRITICAL RESULT CALLED TO, READ BACK BY AND VERIFIED WITH: 1.52*   BNP (last 3 results)  Recent Labs  06/30/12 0923  PROBNP 1577.0*   CBG:  Recent Labs Lab 07/03/12 2347 07/04/12 0418 07/04/12 0747 07/04/12 0827 07/04/12 1154   GLUCAP 140* 96 49* 72 116*    Recent Results (from the past 240 hour(s))  URINE CULTURE     Status: None   Collection Time    06/30/12 10:36 AM      Result Value Range Status   Specimen Description URINE, CLEAN CATCH   Final   Special Requests NONE   Final   Culture  Setup Time 07/01/2012 01:55   Final   Colony Count NO GROWTH   Final   Culture NO GROWTH   Final   Report Status 07/02/2012 FINAL   Final  MRSA PCR SCREENING     Status: None   Collection Time    06/30/12  3:23 PM      Result Value Range Status   MRSA by PCR NEGATIVE  NEGATIVE Final   Comment:            The GeneXpert MRSA Assay (FDA     approved for NASAL specimens     only), is one component of a     comprehensive MRSA colonization     surveillance program. It is not     intended to diagnose MRSA     infection nor to guide or     monitor treatment for     MRSA infections.  MRSA PCR SCREENING     Status: None   Collection Time    07/01/12  1:18 PM      Result Value Range Status   MRSA by PCR NEGATIVE  NEGATIVE Final   Comment:            The GeneXpert MRSA Assay (FDA     approved for NASAL specimens     only), is one component of a     comprehensive MRSA colonization     surveillance program. It is not     intended to diagnose MRSA     infection nor to guide or     monitor treatment for     MRSA infections.  CULTURE, BLOOD (ROUTINE X 2)     Status: None   Collection Time    07/01/12  5:05 PM      Result Value Range Status   Specimen Description BLOOD RIGHT FEMORAL ARTERY   Final   Special Requests     Final   Value: BOTTLES DRAWN AEROBIC AND ANAEROBIC 10CC  PT ON ZITHROMAX,ROCEPHIN   Culture  Setup Time 07/01/2012 23:53   Final   Culture     Final   Value:        BLOOD CULTURE RECEIVED NO GROWTH TO DATE CULTURE WILL BE HELD FOR 5 DAYS BEFORE ISSUING A FINAL NEGATIVE REPORT   Report Status PENDING   Incomplete  CULTURE, BLOOD (ROUTINE X 2)  Status: None   Collection Time    07/02/12  2:30 AM       Result Value Range Status   Specimen Description BLOOD LEFT ARM   Final   Special Requests BOTTLES DRAWN AEROBIC ONLY 10CC   Final   Culture  Setup Time 07/02/2012 09:29   Final   Culture     Final   Value:        BLOOD CULTURE RECEIVED NO GROWTH TO DATE CULTURE WILL BE HELD FOR 5 DAYS BEFORE ISSUING A FINAL NEGATIVE REPORT   Report Status PENDING   Incomplete  CULTURE, BLOOD (ROUTINE X 2)     Status: None   Collection Time    07/02/12  2:45 AM      Result Value Range Status   Specimen Description BLOOD RIGHT ARM   Final   Special Requests     Final   Value: BOTTLES DRAWN AEROBIC AND ANAEROBIC 6CC BLUE,4CC RED   Culture  Setup Time 07/02/2012 09:30   Final   Culture     Final   Value:        BLOOD CULTURE RECEIVED NO GROWTH TO DATE CULTURE WILL BE HELD FOR 5 DAYS BEFORE ISSUING A FINAL NEGATIVE REPORT   Report Status PENDING   Incomplete     If 7PM-7AM, please contact night-coverage [email or web address] Password [email or web address] 07/04/2012, 6:03 PM   LOS: 4 days   Eulla Kochanowski L,MD [phone] 07/04/2012, 6:03 PM

## 2012-07-04 NOTE — Progress Notes (Signed)
Subjective: Interval History: none.  Objective: Vital signs in last 24 hours: Temp:  [97.8 F (36.6 C)-99.3 F (37.4 C)] 99.3 F (37.4 C) (05/17 0415) Pulse Rate:  [65-89] 83 (05/17 0415) Resp:  [18] 18 (05/17 0415) BP: (107-123)/(53-71) 107/62 mmHg (05/17 0415) SpO2:  [97 %-100 %] 97 % (05/17 0415) Weight:  [69.8 kg (153 lb 14.1 oz)] 69.8 kg (153 lb 14.1 oz) (05/16 2008) Weight change: 0 kg (0 lb)  Intake/Output from previous day: 05/16 0701 - 05/17 0700 In: 900 [P.O.:600; IV Piggyback:300] Out: 300 [Urine:300] Intake/Output this shift: Total I/O In: 240 [P.O.:240] Out: -   General appearance: alert, cooperative and icteric Resp: rales bibasilar Cardio: S1, S2 normal and systolic murmur: holosystolic 2/6, blowing at apex GI: mod distension, pos bs Extremities: edema 2+ pre sacral, R fem cath and no other prob  Lab Results:  Recent Labs  07/02/12 0240 07/03/12 0601  WBC 25.6* 20.9*  HGB 8.9* 9.7*  HCT 23.7* 25.5*  PLT 77* 71*   BMET:  Recent Labs  07/02/12 1547 07/03/12 0601  NA 133* 131*  K 4.4 4.2  CL 97 96  CO2 24 23  GLUCOSE 93 68*  BUN 55* 61*  CREATININE 3.30* 2.85*  CALCIUM 8.0* 7.8*   No results found for this basename: PTH,  in the last 72 hours Iron Studies:  Recent Labs  07/01/12 1703  IRON 102  TIBC NOT CALC  FERRITIN 994*    Studies/Results: Nm Myocar Multi W/spect W/wall Motion / Ef  07/03/2012   *RADIOLOGY REPORT*  Clinical Data:  Chest pain  MYOCARDIAL IMAGING WITH SPECT (REST AND PHARMACOLOGIC-STRESS) GATED LEFT VENTRICULAR WALL MOTION STUDY LEFT VENTRICULAR EJECTION FRACTION  Technique:  Standard myocardial SPECT imaging was performed after resting intravenous injection of 10 mCi Tc-58m sestamibi. Subsequently, intravenous infusion of Lexiscan was performed under the supervision of the Cardiology staff.  At peak effect of the drug, 30 mCi Tc-72m sestamibi was injected intravenously and standard myocardial SPECT imaging was  performed.  Quantitative gated imaging was also performed to evaluate left ventricular wall motion and estimate left ventricular ejection fraction.  Comparison:  None.  Findings:  Spect:  No definite inducible or reversible ischemia with pharmacologic stress.  Wall motion:  Grossly normal wall motion and thickening  Ejection fraction:  Calculated Q G S ejection fraction is 74%.  IMPRESSION: Negative for inducible ischemia   Original Report Authenticated By: Judie Petit. Miles Costain, M.D.    I have reviewed the patient's current medications.  Assessment/Plan: 1 AKI chem P, vol xs mod.  Will need to check S Na as well as K, acid/base.  If chem better, d/c fem cath. But need coags to be ok. 2 Cirrhosis check coags 3 Anemia stable 4 DM controlled 5 ETOH abuse P check chem, coags, get cath out soon control bs, abstinence, no ACEI    LOS: 4 days   Samora Jernberg L 07/04/2012,5:14 AM

## 2012-07-05 DIAGNOSIS — E8779 Other fluid overload: Secondary | ICD-10-CM

## 2012-07-05 DIAGNOSIS — J189 Pneumonia, unspecified organism: Secondary | ICD-10-CM | POA: Diagnosis present

## 2012-07-05 DIAGNOSIS — D689 Coagulation defect, unspecified: Secondary | ICD-10-CM

## 2012-07-05 LAB — CBC
HCT: 24.7 % — ABNORMAL LOW (ref 39.0–52.0)
Hemoglobin: 9 g/dL — ABNORMAL LOW (ref 13.0–17.0)
MCH: 33.1 pg (ref 26.0–34.0)
MCV: 90.8 fL (ref 78.0–100.0)
RBC: 2.72 MIL/uL — ABNORMAL LOW (ref 4.22–5.81)
WBC: 20.3 10*3/uL — ABNORMAL HIGH (ref 4.0–10.5)

## 2012-07-05 LAB — GLUCOSE, CAPILLARY
Glucose-Capillary: 92 mg/dL (ref 70–99)
Glucose-Capillary: 47 mg/dL — ABNORMAL LOW (ref 70–99)
Glucose-Capillary: 115 mg/dL — ABNORMAL HIGH (ref 70–99)
Glucose-Capillary: 145 mg/dL — ABNORMAL HIGH (ref 70–99)
Glucose-Capillary: 162 mg/dL — ABNORMAL HIGH (ref 70–99)
Glucose-Capillary: 135 mg/dL — ABNORMAL HIGH (ref 70–99)

## 2012-07-05 LAB — RENAL FUNCTION PANEL
CO2: 28 mEq/L (ref 19–32)
Calcium: 8 mg/dL — ABNORMAL LOW (ref 8.4–10.5)
Chloride: 97 mEq/L (ref 96–112)
Creatinine, Ser: 2.55 mg/dL — ABNORMAL HIGH (ref 0.50–1.35)
GFR calc Af Amer: 32 mL/min — ABNORMAL LOW (ref >90)
GFR calc non Af Amer: 28 mL/min — ABNORMAL LOW (ref >90)
Glucose, Bld: 132 mg/dL — ABNORMAL HIGH (ref 70–99)

## 2012-07-05 LAB — PREPARE FRESH FROZEN PLASMA: Unit division: 0

## 2012-07-05 LAB — PROTIME-INR
INR: 3.16 — ABNORMAL HIGH (ref 0.00–1.49)
Prothrombin Time: 30.7 seconds — ABNORMAL HIGH (ref 11.6–15.2)

## 2012-07-05 MED ORDER — CEFUROXIME AXETIL 500 MG PO TABS
500.0000 mg | ORAL_TABLET | Freq: Two times a day (BID) | ORAL | Status: DC
  Administered 2012-07-05 – 2012-07-07 (×4): 500 mg via ORAL
  Filled 2012-07-05 (×6): qty 1

## 2012-07-05 MED ORDER — FUROSEMIDE 80 MG PO TABS
160.0000 mg | ORAL_TABLET | Freq: Once | ORAL | Status: AC
  Administered 2012-07-05: 160 mg via ORAL
  Filled 2012-07-05: qty 2

## 2012-07-05 MED ORDER — VITAMIN K1 10 MG/ML IJ SOLN
10.0000 mg | Freq: Once | INTRAMUSCULAR | Status: AC
  Administered 2012-07-05: 10 mg via SUBCUTANEOUS
  Filled 2012-07-05: qty 1

## 2012-07-05 MED ORDER — DEXTROSE 50 % IV SOLN
INTRAVENOUS | Status: AC
  Administered 2012-07-05: 25 mL
  Filled 2012-07-05: qty 50

## 2012-07-05 NOTE — Progress Notes (Addendum)
[facility]  PRATIK DALZIEL VHQ:[id number] DOB: Sep 30, 1960 DOA: 06/30/2012 PCP: Erlinda Hong, MD  Assessment/Plan:    Acute renal failure Resolving    Volume overload/Anasarca improved    HYPERTENSION -BP controlled/beta blocker    Alcoholic cirrhosis/Thrombocytopenia/Splenomegaly  Continue abx for possible CAP. azithro. Tessalon helping. Change ceftriaxone to Ceftin. Home soon, after dialysis catheter removed. Patient will get more vitamin K and FFP today, as INR is still above 3.   Elevated troponin Nuclear stress test shows no ischemia and normal EF    HYPERLIPIDEMIA -not on statin due to cirrhosis    Liver nodule Refused bx    Anemia chronic disease  Periodic hypoglycemia due to liver ds.  Seems to be every morning.  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 Cough improved on Tessalon Perles. No bleeding.   Objective: Blood pressure 111/58, pulse 71, temperature 98.1 F (36.7 C), temperature source Oral, resp. rate 18, height 5\' 3"  (1.6 m), weight 76.658 kg (169 lb), SpO2 100.00%.  Intake/Output Summary (Last 24 hours) at 07/05/12 1226 Last data filed at 07/05/12 0900  Gross per 24 hour  Intake 2552.5 ml  Output    725 ml  Net 1827.5 ml   Exam: General: No acute respiratory distress HEENT:  Icteric sclera Lungs: Clear to auscultation bilaterally without wheezes or crackles, RA Cardiovascular: Regular rate and rhythm without murmur gallop or rub normal S1 and S2, improving peripheral edema with 1-2+ left lower strategy and trace right lower extremity, 3 cm JVD Abdomen: Nontender, mildly distended with ascites, soft, bowel sounds  positive, no rebound, no ascites, no appreciable mass Musculoskeletal: No significant cyanosis, clubbing of bilateral lower extremities Neurological: Alert and oriented x 3, moves all extremities x 4 without focal neurological deficits, CN 2-12 intact  Data Reviewed: Basic Metabolic Panel:  Recent Labs Lab 07/02/12 0240 07/02/12 1547 07/03/12 0601 07/04/12 0615 07/05/12 0555  NA 132* 133* 131* 132* 132*  K 4.4 4.4 4.2 4.3 4.3  CL 95* 97 96 97 97  CO2 23 24 23 23 28   GLUCOSE 88 93 68* 77 132*  BUN 53* 55* 61* 71* 74*  CREATININE 4.08* 3.30* 2.85* 2.69* 2.55*  CALCIUM 8.1* 8.0* 7.8* 7.8* 8.0*  MG  --   --   --  2.3  --   PHOS 7.8* 7.6* 6.3* 5.8* 4.9*   Liver Function Tests:  Recent Labs Lab 07/01/12 0320 07/01/12 0700 07/02/12 0240 07/02/12 1547 07/03/12 0601 07/04/12 0615 07/05/12 0555  AST 392* 385* 390*  --  394* 364*  --   ALT 141* 138* 152*  --  146* 134*  --   ALKPHOS 457* 427* 423*  --  469* 484*  --   BILITOT 2.9* 2.9* 3.5*  --  3.7* 4.1*  --   PROT 6.8 6.4 6.4  --  6.4 6.4  --   ALBUMIN 1.4* 1.3* 1.5* 1.6* 1.4* 1.3* 1.6*   No results found for this basename: LIPASE, AMYLASE,  in the last 168 hours No results found for this basename: AMMONIA,  in the last 168 hours CBC:  Recent Labs Lab 06/30/12 0923  07/01/12 0320 07/02/12 0240 07/03/12 0601 07/04/12 0615 07/05/12 0555  WBC 27.3*  --  24.2* 25.6* 20.9* 19.1* 20.3*  NEUTROABS 21.0*  --   --   --   --   --   --  HGB 11.2*  < > 10.0* 8.9* 9.7* 9.6* 9.0*  HCT 30.4*  < > 27.1* 23.7* 25.5* 25.1* 24.7*  MCV 92.4  --  90.3 89.8 88.9 88.7 90.8  PLT 114*  --  98* 77* 71* 68* 61*  < > = values in this interval not displayed. Cardiac Enzymes:  Recent Labs Lab 06/30/12 1656 06/30/12 2135 07/01/12 0320  CKTOTAL 494* 499* 538*  CKMB 11.8* 10.6* 9.7*  TROPONINI 1.64* CRITICAL RESULT CALLED TO, READ BACK BY AND VERIFIED WITH: 1.52*   BNP (last 3 results)  Recent Labs  06/30/12 0923  PROBNP 1577.0*    CBG:  Recent Labs Lab 07/04/12 1704 07/04/12 2004 07/05/12 0022 07/05/12 0409 07/05/12 0436  GLUCAP 117* 120* 92 47* 110*    Recent Results (from the past 240 hour(s))  URINE CULTURE     Status: None   Collection Time    06/30/12 10:36 AM      Result Value Range Status   Specimen Description URINE, CLEAN CATCH   Final   Special Requests NONE   Final   Culture  Setup Time 07/01/2012 01:55   Final   Colony Count NO GROWTH   Final   Culture NO GROWTH   Final   Report Status 07/02/2012 FINAL   Final  MRSA PCR SCREENING     Status: None   Collection Time    06/30/12  3:23 PM      Result Value Range Status   MRSA by PCR NEGATIVE  NEGATIVE Final   Comment:            The GeneXpert MRSA Assay (FDA     approved for NASAL specimens     only), is one component of a     comprehensive MRSA colonization     surveillance program. It is not     intended to diagnose MRSA     infection nor to guide or     monitor treatment for     MRSA infections.  MRSA PCR SCREENING     Status: None   Collection Time    07/01/12  1:18 PM      Result Value Range Status   MRSA by PCR NEGATIVE  NEGATIVE Final   Comment:            The GeneXpert MRSA Assay (FDA     approved for NASAL specimens     only), is one component of a     comprehensive MRSA colonization     surveillance program. It is not     intended to diagnose MRSA     infection nor to guide or     monitor treatment for     MRSA infections.  CULTURE, BLOOD (ROUTINE X 2)     Status: None   Collection Time    07/01/12  5:05 PM      Result Value Range Status   Specimen Description BLOOD RIGHT FEMORAL ARTERY   Final   Special Requests     Final   Value: BOTTLES DRAWN AEROBIC AND ANAEROBIC 10CC  PT ON ZITHROMAX,ROCEPHIN   Culture  Setup Time 07/01/2012 23:53   Final   Culture     Final   Value:        BLOOD CULTURE RECEIVED NO GROWTH TO DATE CULTURE WILL BE HELD FOR 5 DAYS BEFORE ISSUING A FINAL NEGATIVE REPORT   Report Status  PENDING   Incomplete  CULTURE, BLOOD (ROUTINE X 2)     Status:  None   Collection Time    07/02/12  2:30 AM      Result Value Range Status   Specimen Description BLOOD LEFT ARM   Final   Special Requests BOTTLES DRAWN AEROBIC ONLY 10CC   Final   Culture  Setup Time 07/02/2012 09:29   Final   Culture     Final   Value:        BLOOD CULTURE RECEIVED NO GROWTH TO DATE CULTURE WILL BE HELD FOR 5 DAYS BEFORE ISSUING A FINAL NEGATIVE REPORT   Report Status PENDING   Incomplete  CULTURE, BLOOD (ROUTINE X 2)     Status: None   Collection Time    07/02/12  2:45 AM      Result Value Range Status   Specimen Description BLOOD RIGHT ARM   Final   Special Requests     Final   Value: BOTTLES DRAWN AEROBIC AND ANAEROBIC 6CC BLUE,4CC RED   Culture  Setup Time 07/02/2012 09:30   Final   Culture     Final   Value:        BLOOD CULTURE RECEIVED NO GROWTH TO DATE CULTURE WILL BE HELD FOR 5 DAYS BEFORE ISSUING A FINAL NEGATIVE REPORT   Report Status PENDING   Incomplete     If 7PM-7AM, please contact night-coverage [email or web address] Password [email or web address] 07/05/2012, 12:26 PM   LOS: 5 days   Advith Martine L,MD [phone] 07/05/2012, 12:26 PM

## 2012-07-05 NOTE — Progress Notes (Signed)
Hypoglycemic Event  CBG: 47  Treatment: D50 IV 25 mL  Symptoms: None  Follow-up CBG: Time:0435 CBG Result:110  Possible Reasons for Event: Unknown  Comments/MD notified:On call MD notified. Will continue to monitor patient and check CBG. Snack offered as well.     Woodard, Arthur Ritthaler  Remember to initiate Hypoglycemia Order Set & complete

## 2012-07-05 NOTE — Progress Notes (Signed)
Subjective: Interval History: none.  Objective: Vital signs in last 24 hours: Temp:  [98 F (36.7 C)-98.8 F (37.1 C)] 98.3 F (36.8 C) (05/18 0419) Pulse Rate:  [67-84] 83 (05/18 0419) Resp:  [17-26] 26 (05/18 0419) BP: (102-122)/(54-68) 116/64 mmHg (05/18 0419) SpO2:  [97 %-100 %] 100 % (05/18 0419) Weight:  [76.658 kg (169 lb)] 76.658 kg (169 lb) (05/18 0419) Weight change: 6.858 kg (15 lb 1.9 oz)  Intake/Output from previous day: 05/17 0701 - 05/18 0700 In: 2605 [P.O.:940; I.V.:380; Blood:635; IV Piggyback:50] Out: 800 [Urine:800] Intake/Output this shift:    General appearance: alert, cooperative and icteric Resp: rales bibasilar Cardio: S1, S2 normal and systolic murmur: holosystolic 2/6, blowing at apex GI: pos bs, distended, pos fw Extremities: edema 2+  Lab Results:  Recent Labs  07/03/12 0601 07/04/12 0615  WBC 20.9* 19.1*  HGB 9.7* 9.6*  HCT 25.5* 25.1*  PLT 71* 68*   BMET:  Recent Labs  07/03/12 0601 07/04/12 0615  NA 131* 132*  K 4.2 4.3  CL 96 97  CO2 23 23  GLUCOSE 68* 77  BUN 61* 71*  CREATININE 2.85* 2.69*  CALCIUM 7.8* 7.8*   No results found for this basename: PTH,  in the last 72 hours Iron Studies: No results found for this basename: IRON, TIBC, TRANSFERRIN, FERRITIN,  in the last 72 hours  Studies/Results: Nm Myocar Multi W/spect W/wall Motion / Ef  07/03/2012   *RADIOLOGY REPORT*  Clinical Data:  Chest pain  MYOCARDIAL IMAGING WITH SPECT (REST AND PHARMACOLOGIC-STRESS) GATED LEFT VENTRICULAR WALL MOTION STUDY LEFT VENTRICULAR EJECTION FRACTION  Technique:  Standard myocardial SPECT imaging was performed after resting intravenous injection of 10 mCi Tc-73m sestamibi. Subsequently, intravenous infusion of Lexiscan was performed under the supervision of the Cardiology staff.  At peak effect of the drug, 30 mCi Tc-29m sestamibi was injected intravenously and standard myocardial SPECT imaging was performed.  Quantitative gated imaging was  also performed to evaluate left ventricular wall motion and estimate left ventricular ejection fraction.  Comparison:  None.  Findings:  Spect:  No definite inducible or reversible ischemia with pharmacologic stress.  Wall motion:  Grossly normal wall motion and thickening  Ejection fraction:  Calculated Q G S ejection fraction is 74%.  IMPRESSION: Negative for inducible ischemia   Original Report Authenticated By: Judie Petit. Miles Costain, M.D.    I have reviewed the patient's current medications.  Assessment/Plan: 1 AKI  Making urine but very pos Intake.  Will give Lasix, chem P. 2 Anemia stable 3 DM controlled 4 AMI stable 5 cirrhosis repeat INR,need to get cath out. P INR, F/u chem ,Lasix    LOS: 5 days   Kellin Fifer L 07/05/2012,7:04 AM

## 2012-07-06 DIAGNOSIS — J189 Pneumonia, unspecified organism: Secondary | ICD-10-CM

## 2012-07-06 LAB — COMPREHENSIVE METABOLIC PANEL
AST: 211 U/L — ABNORMAL HIGH (ref 0–37)
Albumin: 1.8 g/dL — ABNORMAL LOW (ref 3.5–5.2)
BUN: 74 mg/dL — ABNORMAL HIGH (ref 6–23)
Calcium: 8.1 mg/dL — ABNORMAL LOW (ref 8.4–10.5)
Creatinine, Ser: 2.07 mg/dL — ABNORMAL HIGH (ref 0.50–1.35)
Total Bilirubin: 6.1 mg/dL — ABNORMAL HIGH (ref 0.3–1.2)
Total Protein: 6.4 g/dL (ref 6.0–8.3)

## 2012-07-06 LAB — GLUCOSE, CAPILLARY
Glucose-Capillary: 98 mg/dL (ref 70–99)
Glucose-Capillary: 69 mg/dL — ABNORMAL LOW (ref 70–99)
Glucose-Capillary: 96 mg/dL (ref 70–99)
Glucose-Capillary: 82 mg/dL (ref 70–99)

## 2012-07-06 LAB — PHOSPHORUS: Phosphorus: 4 mg/dL (ref 2.3–4.6)

## 2012-07-06 LAB — PREPARE FRESH FROZEN PLASMA: Unit division: 0

## 2012-07-06 LAB — PREPARE PLATELET PHERESIS
Unit division: 0
Unit division: 0

## 2012-07-06 LAB — CBC
MCH: 33.2 pg (ref 26.0–34.0)
MCHC: 36.3 g/dL — ABNORMAL HIGH (ref 30.0–36.0)
MCV: 91.5 fL (ref 78.0–100.0)
Platelets: 67 10*3/uL — ABNORMAL LOW (ref 150–400)
RDW: 15.2 % (ref 11.5–15.5)

## 2012-07-06 MED ORDER — FUROSEMIDE 10 MG/ML IJ SOLN
80.0000 mg | Freq: Once | INTRAMUSCULAR | Status: AC
  Administered 2012-07-06: 80 mg via INTRAVENOUS
  Filled 2012-07-06 (×2): qty 8

## 2012-07-06 NOTE — Progress Notes (Signed)
[facility]  Arthur Woodard WUJ:[id number] DOB: [birth year] DOA: 06/30/2012 PCP: Erlinda Hong, MD  Assessment/Plan:    Acute renal failure Resolving    Volume overload/Anasarca improved    HYPERTENSION -BP controlled/beta blocker    Alcoholic cirrhosis/Thrombocytopenia/Splenomegaly: Wishes to quit drinking.  Continue abx for possible CAP. White blood cell count remains high, but clinically, patient has been very stable for several days. Patient has transitioned to oral antibiotics several days ago. Discussed with nephrology. Discontinue femoral line   Elevated troponin Nuclear stress test shows no ischemia and normal EF    HYPERLIPIDEMIA -not on statin due to cirrhosis    Liver nodule Refused bx    Anemia chronic disease  Periodic hypoglycemia due to liver ds.  Seems to be every morning.  Will monitor overnight and discharge in the morning if stable  Code Status: Full Family Communication: None today Disposition Plan: Home Isolation: None  Consultants: Nephrology Cardiology  Procedures: Insertion triple lumen hemodialysis catheter right femoral vein  Antibiotics: Zithromax 5/13 >>> Rocephin 5/13 >>>5/18 ceftin 5/18  HPI/Subjective: No complaints. Cough improved on Tessalon Perles. No bleeding.   Objective: Blood pressure 120/74, pulse 80, temperature 98.4 F (36.9 C), temperature source Oral, resp. rate 18, height 5\' 3"  (1.6 m), weight 75.1 kg (165 lb 9.1 oz), SpO2 98.00%.  Intake/Output Summary (Last 24 hours) at 07/06/12 1644 Last data filed at 07/06/12 1500  Gross per 24 hour  Intake   2073 ml  Output   1751 ml  Net    322 ml   Exam: General: No acute respiratory distress HEENT:  Icteric sclera Lungs: Clear to auscultation bilaterally without wheezes or crackles, RA Cardiovascular: Regular rate and rhythm without murmur gallop or rub normal S1 and S2, improving peripheral edema with 1-2+ left lower strategy and trace right lower  extremity, 3 cm JVD Abdomen: Nontender, mildly distended with ascites, soft, bowel sounds positive, no rebound, no ascites, no appreciable mass Musculoskeletal: No significant cyanosis, clubbing of bilateral lower extremities Neurological: Alert and oriented x 3, moves all extremities x 4 without focal neurological deficits, CN 2-12 intact  Data Reviewed: Basic Metabolic Panel:  Recent Labs Lab 07/02/12 1547 07/03/12 0601 07/04/12 0615 07/05/12 0555 07/06/12 0555  NA 133* 131* 132* 132* 134*  K 4.4 4.2 4.3 4.3 4.2  CL 97 96 97 97 97  CO2 24 23 23 28 27   GLUCOSE 93 68* 77 132* 85  BUN 55* 61* 71* 74* 74*  CREATININE 3.30* 2.85* 2.69* 2.55* 2.07*  CALCIUM 8.0* 7.8* 7.8* 8.0* 8.1*  MG  --   --  2.3  --   --   PHOS 7.6* 6.3* 5.8* 4.9* 4.0   Liver Function Tests:  Recent Labs Lab 07/01/12 0700 07/02/12 0240 07/02/12 1547 07/03/12 0601 07/04/12 0615 07/05/12 0555 07/06/12 0555  AST 385* 390*  --  394* 364*  --  211*  ALT 138* 152*  --  146* 134*  --  83*  ALKPHOS 427* 423*  --  469* 484*  --  375*  BILITOT 2.9* 3.5*  --  3.7* 4.1*  --  6.1*  PROT 6.4 6.4  --  6.4 6.4  --  6.4  ALBUMIN 1.3* 1.5* 1.6* 1.4* 1.3* 1.6* 1.8*   No results found for this basename: LIPASE, AMYLASE,  in the last 168 hours No results found for this basename: AMMONIA,  in the last 168 hours CBC:  Recent Labs Lab 06/30/12 0923  07/02/12 0240 07/03/12 0601 07/04/12 0615 07/05/12 0555 07/06/12  0555  WBC 27.3*  < > 25.6* 20.9* 19.1* 20.3* 19.3*  NEUTROABS 21.0*  --   --   --   --   --   --   HGB 11.2*  < > 8.9* 9.7* 9.6* 9.0* 8.6*  HCT 30.4*  < > 23.7* 25.5* 25.1* 24.7* 23.7*  MCV 92.4  < > 89.8 88.9 88.7 90.8 91.5  PLT 114*  < > 77* 71* 68* 61* 67*  < > = values in this interval not displayed. Cardiac Enzymes:  Recent Labs Lab 06/30/12 1656 06/30/12 2135 07/01/12 0320  CKTOTAL 494* 499* 538*  CKMB 11.8* 10.6* 9.7*  TROPONINI 1.64* CRITICAL RESULT CALLED TO, READ BACK BY AND VERIFIED  WITH: 1.52*   BNP (last 3 results)  Recent Labs  06/30/12 0923  PROBNP 1577.0*   CBG:  Recent Labs Lab 07/05/12 2354 07/06/12 0420 07/06/12 0732 07/06/12 0959 07/06/12 1126  GLUCAP 124* 98 69* 96 82    Recent Results (from the past 240 hour(s))  URINE CULTURE     Status: None   Collection Time    06/30/12 10:36 AM      Result Value Range Status   Specimen Description URINE, CLEAN CATCH   Final   Special Requests NONE   Final   Culture  Setup Time 07/01/2012 01:55   Final   Colony Count NO GROWTH   Final   Culture NO GROWTH   Final   Report Status 07/02/2012 FINAL   Final  MRSA PCR SCREENING     Status: None   Collection Time    06/30/12  3:23 PM      Result Value Range Status   MRSA by PCR NEGATIVE  NEGATIVE Final   Comment:            The GeneXpert MRSA Assay (FDA     approved for NASAL specimens     only), is one component of a     comprehensive MRSA colonization     surveillance program. It is not     intended to diagnose MRSA     infection nor to guide or     monitor treatment for     MRSA infections.  MRSA PCR SCREENING     Status: None   Collection Time    07/01/12  1:18 PM      Result Value Range Status   MRSA by PCR NEGATIVE  NEGATIVE Final   Comment:            The GeneXpert MRSA Assay (FDA     approved for NASAL specimens     only), is one component of a     comprehensive MRSA colonization     surveillance program. It is not     intended to diagnose MRSA     infection nor to guide or     monitor treatment for     MRSA infections.  CULTURE, BLOOD (ROUTINE X 2)     Status: None   Collection Time    07/01/12  5:05 PM      Result Value Range Status   Specimen Description BLOOD RIGHT FEMORAL ARTERY   Final   Special Requests     Final   Value: BOTTLES DRAWN AEROBIC AND ANAEROBIC 10CC  PT ON ZITHROMAX,ROCEPHIN   Culture  Setup Time 07/01/2012 23:53   Final   Culture     Final   Value:        BLOOD CULTURE RECEIVED NO GROWTH TO  DATE CULTURE  WILL BE HELD FOR 5 DAYS BEFORE ISSUING A FINAL NEGATIVE REPORT   Report Status PENDING   Incomplete  CULTURE, BLOOD (ROUTINE X 2)     Status: None   Collection Time    07/02/12  2:30 AM      Result Value Range Status   Specimen Description BLOOD LEFT ARM   Final   Special Requests BOTTLES DRAWN AEROBIC ONLY 10CC   Final   Culture  Setup Time 07/02/2012 09:29   Final   Culture     Final   Value:        BLOOD CULTURE RECEIVED NO GROWTH TO DATE CULTURE WILL BE HELD FOR 5 DAYS BEFORE ISSUING A FINAL NEGATIVE REPORT   Report Status PENDING   Incomplete  CULTURE, BLOOD (ROUTINE X 2)     Status: None   Collection Time    07/02/12  2:45 AM      Result Value Range Status   Specimen Description BLOOD RIGHT ARM   Final   Special Requests     Final   Value: BOTTLES DRAWN AEROBIC AND ANAEROBIC 6CC BLUE,4CC RED   Culture  Setup Time 07/02/2012 09:30   Final   Culture     Final   Value:        BLOOD CULTURE RECEIVED NO GROWTH TO DATE CULTURE WILL BE HELD FOR 5 DAYS BEFORE ISSUING A FINAL NEGATIVE REPORT   Report Status PENDING   Incomplete     If 7PM-7AM, please contact night-coverage [email or web address] Password [email or web address] 07/06/2012, 4:44 PM   LOS: 6 days   Zeffie Bickert L,MD [phone] 07/06/2012, 4:44 PM

## 2012-07-06 NOTE — Progress Notes (Signed)
CBG:69 Treatment: 15 GM carbohydrate snack  Symptoms: None  Follow-up CBG: Time 959 CBG Result 96  Possible Reasons for Event: Unknown  Comments/MD notified:

## 2012-07-06 NOTE — Progress Notes (Signed)
CBG: 53  Treatment: 15 GM carbohydrate snack  Symptoms: None  Follow-up CBG: Time:1750 CBG Result:86 Possible Reasons for Event:  Inadequate meal intake  Comments/MD notified: sullivan

## 2012-07-06 NOTE — Progress Notes (Signed)
Subjective: Interval History: none. Pt w/o complaints, creat down to 2.07  Objective: Vital signs in last 24 hours: Temp:  [98.2 F (36.8 C)-99.4 F (37.4 C)] 98.4 F (36.9 C) (05/19 1400) Pulse Rate:  [79-86] 80 (05/19 1400) Resp:  [17-19] 18 (05/19 1400) BP: (110-120)/(60-74) 120/74 mmHg (05/19 1400) SpO2:  [97 %-98 %] 98 % (05/19 1400) Weight:  [75.1 kg (165 lb 9.1 oz)] 75.1 kg (165 lb 9.1 oz) (05/18 2052) Weight change: -1.558 kg (-3 lb 7 oz)  Intake/Output from previous day: 05/18 0701 - 05/19 0700 In: 2085 [P.O.:900; I.V.:510; Blood:675] Out: 1576 [Urine:1575; Stool:1] Intake/Output this shift: Total I/O In: 638 [P.O.:478; I.V.:160] Out: 350 [Urine:350]  General appearance: alert, cooperative and icteric Resp: rales bibasilar Cardio: S1, S2 normal and systolic murmur: holosystolic 2/6, blowing at apex GI: pos bs, distended, pos fw Extremities: edema 1-2+  Lab Results:  Recent Labs  07/05/12 0555 07/06/12 0555  WBC 20.3* 19.3*  HGB 9.0* 8.6*  HCT 24.7* 23.7*  PLT 61* 67*   BMET:   Recent Labs  07/05/12 0555 07/06/12 0555  NA 132* 134*  K 4.3 4.2  CL 97 97  CO2 28 27  GLUCOSE 132* 85  BUN 74* 74*  CREATININE 2.55* 2.07*  CALCIUM 8.0* 8.1*   No results found for this basename: PTH,  in the last 72 hours Iron Studies: No results found for this basename: IRON, TIBC, TRANSFERRIN, FERRITIN,  in the last 72 hours  Studies/Results: No results found.  I have reviewed the patient's current medications.  Assessment/Plan: 1 AKI resolving 2 Anemia stable 3 DM controlled 4 AMI stable 5 cirrhosis P remove HD cath, discussed w primary MD, give lasix 80 IV x 1    LOS: 6 days   Adriaan Maltese D 07/06/2012,4:46 PM

## 2012-07-07 DIAGNOSIS — D72829 Elevated white blood cell count, unspecified: Secondary | ICD-10-CM

## 2012-07-07 LAB — BASIC METABOLIC PANEL
Chloride: 96 mEq/L (ref 96–112)
GFR calc Af Amer: 51 mL/min — ABNORMAL LOW (ref >90)
Potassium: 4.5 mEq/L (ref 3.5–5.1)
Sodium: 132 mEq/L — ABNORMAL LOW (ref 135–145)

## 2012-07-07 LAB — CULTURE, BLOOD (ROUTINE X 2): Culture: NO GROWTH

## 2012-07-07 LAB — GLUCOSE, CAPILLARY: Glucose-Capillary: 144 mg/dL — ABNORMAL HIGH (ref 70–99)

## 2012-07-07 MED ORDER — FUROSEMIDE 40 MG PO TABS
40.0000 mg | ORAL_TABLET | Freq: Two times a day (BID) | ORAL | Status: DC
  Administered 2012-07-07: 40 mg via ORAL
  Filled 2012-07-07 (×2): qty 1

## 2012-07-07 MED ORDER — METOPROLOL TARTRATE 25 MG PO TABS: 25.0000 mg | ORAL_TABLET | Freq: Two times a day (BID) | ORAL | Status: DC

## 2012-07-07 MED ORDER — FUROSEMIDE 40 MG PO TABS: 40.0000 mg | ORAL_TABLET | Freq: Two times a day (BID) | ORAL | Status: DC

## 2012-07-07 NOTE — Progress Notes (Signed)
[facility]  [address] [id number] [place] [address] Phone: [phone] Fax: [phone]  Jul 07, 2012  Patient: Arthur Woodard  Date of Birth: [birth year]  Date of Visit: 06/30/2012    To Whom It May Concern:  Alphonza Homes was seen and treated in our emergency department on 06/30/2012. Kelsey N Vanderhoff  may return to work on 07/20/12.  Sincerely,      Crista Curb, M.D.

## 2012-07-07 NOTE — Discharge Summary (Signed)
Physician Discharge Summary  Patient ID: Arthur Woodard MRN: [id number] DOB/AGE: 10/26/1960 52 y.o.  Admit date: 06/30/2012 Discharge date: 07/07/2012  Discharge Diagnoses:  Active Problems:   Coagulopathy   HYPERLIPIDEMIA   HYPERTENSION   Alcoholic cirrhosis   Thrombocytopenia   Liver nodule, refused biopsy in the past   Acute renal failure   Leukocytosis   Anemia   Elevated troponin   Volume overload   History of Diabetes mellitus, with recurrent hypoglycemia during this admission   CAP (community acquired pneumonia)     Medication List    STOP taking these medications       atorvastatin 20 MG tablet  Commonly known as:  LIPITOR     glimepiride 4 MG tablet  Commonly known as:  AMARYL     lisinopril 20 MG tablet  Commonly known as:  PRINIVIL,ZESTRIL      TAKE these medications       cetirizine 10 MG tablet  Commonly known as:  ZYRTEC  Take 10 mg by mouth daily.     fluticasone 27.5 MCG/SPRAY nasal spray  Commonly known as:  VERAMYST  Place 2 sprays into the nose daily.     guaiFENesin-codeine 100-10 MG/5ML syrup  Commonly known as:  ROBITUSSIN AC  Take 5 mLs by mouth 4 (four) times daily as needed for cough (cough).     metoprolol tartrate 25 MG tablet  Commonly known as:  LOPRESSOR  Take 1 tablet (25 mg total) by mouth 2 (two) times daily.      lasix 40 mg po bid      Discharge Orders   Future Appointments Provider Department Dept Phone   07/20/2012 11:00 AM Beverley Fiedler, MD [facility] [phone]   Future Orders Complete By Expires     Diet - low sodium heart healthy  As directed     Discharge instructions  As directed     Comments:      No alcohol or tylenol products    Increase activity slowly  As directed        Follow-up Information   Follow up with Beverley Fiedler, MD On 07/20/2012. (11AM)    Contact information:   [address] [place] [address] [phone]       Follow up with Ricki Rodriguez, MD In  2 weeks.   Contact information:   [address] [place] [address] [phone]       Follow up with [facility], MD In 1 week.   Contact information:   [address] [place] [address] [phone]       Disposition: home  Discharged Condition:   Consults: Treatment Team:  Trevor Iha, MD [facility]  Labs:   Results for orders placed during the hospital encounter of 06/30/12 (from the past 48 hour(s))  GLUCOSE, CAPILLARY     Status: Abnormal   Collection Time    07/05/12 12:13 PM      Result Value Range   Glucose-Capillary 162 (*) 70 - 99 mg/dL  PREPARE FRESH FROZEN PLASMA     Status: None   Collection Time    07/05/12  3:45 PM      Result Value Range   Unit Number [id number]     Blood Component Type THAWED PLASMA     Unit division 00     Status of Unit ISSUED,FINAL     Transfusion Status OK TO TRANSFUSE     Unit Number [id number]  Blood Component Type THAWED PLASMA     Unit division 00     Status of Unit ISSUED,FINAL     Transfusion Status OK TO TRANSFUSE    GLUCOSE, CAPILLARY     Status: Abnormal   Collection Time    07/05/12  5:14 PM      Result Value Range   Glucose-Capillary 115 (*) 70 - 99 mg/dL  GLUCOSE, CAPILLARY     Status: Abnormal   Collection Time    07/05/12  8:49 PM      Result Value Range   Glucose-Capillary 135 (*) 70 - 99 mg/dL   Comment 1 Notify RN    GLUCOSE, CAPILLARY     Status: Abnormal   Collection Time    07/05/12 11:54 PM      Result Value Range   Glucose-Capillary 124 (*) 70 - 99 mg/dL   Comment 1 Notify RN    GLUCOSE, CAPILLARY     Status: None   Collection Time    07/06/12  4:20 AM      Result Value Range   Glucose-Capillary 98  70 - 99 mg/dL   Comment 1 Notify RN    CBC     Status: Abnormal   Collection Time    07/06/12  5:55 AM      Result Value Range   WBC 19.3 (*) 4.0 - 10.5 K/uL   RBC 2.59 (*) 4.22 - 5.81 MIL/uL   Hemoglobin 8.6 (*) 13.0 - 17.0 g/dL   HCT  24.4 (*) 01.0 - 52.0 %   MCV 91.5  78.0 - 100.0 fL   MCH 33.2  26.0 - 34.0 pg   MCHC 36.3 (*) 30.0 - 36.0 g/dL   RDW 27.2  53.6 - 64.4 %   Platelets 67 (*) 150 - 400 K/uL   Comment: CONSISTENT WITH PREVIOUS RESULT  PHOSPHORUS     Status: None   Collection Time    07/06/12  5:55 AM      Result Value Range   Phosphorus 4.0  2.3 - 4.6 mg/dL  COMPREHENSIVE METABOLIC PANEL     Status: Abnormal   Collection Time    07/06/12  5:55 AM      Result Value Range   Sodium 134 (*) 135 - 145 mEq/L   Potassium 4.2  3.5 - 5.1 mEq/L   Chloride 97  96 - 112 mEq/L   CO2 27  19 - 32 mEq/L   Glucose, Bld 85  70 - 99 mg/dL   BUN 74 (*) 6 - 23 mg/dL   Creatinine, Ser 0.34 (*) 0.50 - 1.35 mg/dL   Calcium 8.1 (*) 8.4 - 10.5 mg/dL   Total Protein 6.4  6.0 - 8.3 g/dL   Albumin 1.8 (*) 3.5 - 5.2 g/dL   AST 742 (*) 0 - 37 U/L   ALT 83 (*) 0 - 53 U/L   Alkaline Phosphatase 375 (*) 39 - 117 U/L   Total Bilirubin 6.1 (*) 0.3 - 1.2 mg/dL   GFR calc non Af Amer 35 (*) >90 mL/min   GFR calc Af Amer 41 (*) >90 mL/min   Comment:            The eGFR has been calculated     using the CKD EPI equation.     This calculation has not been     validated in all clinical     situations.     eGFR's persistently     <90 mL/min signify  possible Chronic Kidney Disease.  GLUCOSE, CAPILLARY     Status: Abnormal   Collection Time    07/06/12  7:32 AM      Result Value Range   Glucose-Capillary 69 (*) 70 - 99 mg/dL  GLUCOSE, CAPILLARY     Status: None   Collection Time    07/06/12  9:59 AM      Result Value Range   Glucose-Capillary 96  70 - 99 mg/dL  GLUCOSE, CAPILLARY     Status: None   Collection Time    07/06/12 11:26 AM      Result Value Range   Glucose-Capillary 82  70 - 99 mg/dL  GLUCOSE, CAPILLARY     Status: Abnormal   Collection Time    07/06/12  4:44 PM      Result Value Range   Glucose-Capillary 53 (*) 70 - 99 mg/dL  GLUCOSE, CAPILLARY     Status: None   Collection Time    07/06/12  5:50 PM       Result Value Range   Glucose-Capillary 86  70 - 99 mg/dL  GLUCOSE, CAPILLARY     Status: Abnormal   Collection Time    07/06/12  8:08 PM      Result Value Range   Glucose-Capillary 161 (*) 70 - 99 mg/dL  GLUCOSE, CAPILLARY     Status: Abnormal   Collection Time    07/06/12 11:45 PM      Result Value Range   Glucose-Capillary 165 (*) 70 - 99 mg/dL  GLUCOSE, CAPILLARY     Status: Abnormal   Collection Time    07/07/12  4:29 AM      Result Value Range   Glucose-Capillary 129 (*) 70 - 99 mg/dL  GLUCOSE, CAPILLARY     Status: Abnormal   Collection Time    07/07/12  7:43 AM      Result Value Range   Glucose-Capillary 134 (*) 70 - 99 mg/dL   Comment 1 Notify RN     Comment 2 Documented in Chart      Diagnostics:  X-ray Chest Pa And Lateral   07/01/2012   *RADIOLOGY REPORT*  Clinical Data: CHF versus pneumonia  CHEST - 2 VIEW  Comparison: 06/30/2012  Findings: Heart size is normal. There is a right pleural effusion which is similar in volume to the previous exam.  No change in aeration to the right lower lobe.  There is new airspace consolidation versus loculated pleural fluid within the medial left upper lobe.  Left lung is clear.  IMPRESSION:  1.  Persistent right pleural effusion. 2. No change in the aeration to right lower lobe.  New opacity within the medial right upper lobe may represent consolidation or loculated pleural fluid.   Original Report Authenticated By: Signa Kell, M.D.   US Renal  06/30/2012   *RADIOLOGY REPORT*  Clinical Data: AKI  RENAL/URINARY TRACT ULTRASOUND COMPLETE  Comparison:  MR abdomen 11/04/2011 and abdominal ultrasound 07/04/2010  Findings:  Right Kidney:  The right kidney measures 11.3 cm in sagittal length, normal.  Compared to the adjacent liver, the echogenicity of the right kidney appears increased.  The cortical thickness is maintained.  There is no hydronephrosis or evidence of mass.  Left Kidney:  The left kidney measures 11.5 cm in sagittal  length, normal.  The echogenicity of the left kidney appears slightly increased.  There is no hydronephrosis or evidence of mass.  Bladder:  The urinary bladder is only mildly distended at the  time imaging.  The bladder wall thickness measures 6.5 mm.  Additional:  Ascites is noted adjacent to the liver.  The imaged portion of the liver demonstrates a mildly micronodular contour, suggestive of cirrhosis.  A right pleural effusion is imaged.  IMPRESSION:  1.  Both kidneys are normal in size and cortical thickness.  The echogenicity of both kidneys is slightly increased, raising the possibility of chronic medical renal disease.  Suggest clinical correlation. 2.  Negative for hydronephrosis. 3.  Mild perihepatic ascites noted with a micronodular hepatic contour suggesting hepatic cirrhosis. 4.  Right pleural effusion.   Original Report Authenticated By: Britta Mccreedy, M.D.   Nm Myocar Multi W/spect W/wall Motion / Ef  07/03/2012   *RADIOLOGY REPORT*  Clinical Data:  Chest pain  MYOCARDIAL IMAGING WITH SPECT (REST AND PHARMACOLOGIC-STRESS) GATED LEFT VENTRICULAR WALL MOTION STUDY LEFT VENTRICULAR EJECTION FRACTION  Technique:  Standard myocardial SPECT imaging was performed after resting intravenous injection of 10 mCi Tc-4m sestamibi. Subsequently, intravenous infusion of Lexiscan was performed under the supervision of the Cardiology staff.  At peak effect of the drug, 30 mCi Tc-49m sestamibi was injected intravenously and standard myocardial SPECT imaging was performed.  Quantitative gated imaging was also performed to evaluate left ventricular wall motion and estimate left ventricular ejection fraction.  Comparison:  None.  Findings:  Spect:  No definite inducible or reversible ischemia with pharmacologic stress.  Wall motion:  Grossly normal wall motion and thickening  Ejection fraction:  Calculated Q G S ejection fraction is 74%.  IMPRESSION: Negative for inducible ischemia   Original Report Authenticated By: Judie Petit.  Miles Costain, M.D.   Dg Chest Port 1 View  06/30/2012   *RADIOLOGY REPORT*  Clinical Data: Cough and shortness of breath.  PORTABLE CHEST - 1 VIEW  Comparison: None.  Findings: There is cardiomegaly and interstitial edema.  Small right effusion and basilar airspace disease are identified.  IMPRESSION: Interstitial pulmonary edema.  Small right effusion and basilar airspace disease likely representing atelectasis.   Original Report Authenticated By: Holley Dexter, M.D.   Procedures:  Hemodialysis catheter, hemodialysis  EKG: .SINUS TACHYCARDIA ~ V-rate> 99 NONSPECIFIC INTRAVENTRICULAR CONDUCTION DELAY ~ QRSd >146mS, not LBBB/RBBB  Echo Left ventricle: The cavity size was normal. There was mild concentric hypertrophy. Systolic function was normal. The estimated ejection fraction was in the range of 60% to 65%. Wall motion was normal; there were no regional wall motion abnormalities. - Pericardium, extracardiac: There was a right pleural effusion.  Venous doppler bilateral lower extremities. No evidence of deep vein thrombosis involving the right lower extremity. - No evidence of deep vein thrombosis involving the left lower extremity. - No evidence of Baker's cyst on the right or left.  Full Code   Hospital Course: 52 y.o. male with history of alcoholic cirrhosis, thrombocytopenia, coagulopathy, liver lesions of uncertain nature (elevated alpha-fetoprotein-? HCC-declined liver biopsy), HTN, DM, iron deficiency anemia and splenomegaly presented to the [facility] ED on 06/30/12 with complaints of cough and dyspnea. Patient gives approximately 3 month history of cough, progressively worsening, mostly dry but at times has white clear sputum and no blood, associated with chills and low-grade fever but has not checked temperatures. Was seen by PCP a couple of weeks ago and treated with Z-Pak and cough medicine. Symptoms temporarily improved but returned. For the last 2 weeks, complains of  progressively worsening dyspnea, orthopnea but no PND. Dyspnea is worse with activity, during cough spells and while talking. Some anterior chest pain but only while  coughing. Also gives 3 day history of bilateral leg swelling and abdominal distention without pain. Urine output has apparently decreased and yellowish in color. His wife indicates that he has dizziness on standing, pruritus with some blood after scratching and yellowish discoloration of eyes. Patient claims that he has not had alcohol for approximately a month. He complains of poor appetite and early satiety but no nausea, vomiting or diarrhea. No bleeding. In the ED, sodium 121, potassium 7.4, chloride 92, bicarbonate 17, BUN 65, creatinine 4.91, glucose 67, alkaline phosphatase 428, albumin 1.5, AST 325, ALT 119, troponin 0.38, proBNP 1577, INR 2.7, hemoglobin 11.2, platelets 114 and WBC 27.3. Chest x-ray suggestive of interstitial edema. Patient received a dose of Kayexalate, insulin, dextrose, calcium, Rocephin and azithromycin in the ED.  Patient required several hemodialysis sessions after HD catheter placed.  Hyperkalemia corrected, but creatinine has plateaued at about 2.  Echo showed normal EF, and nuclear stress test was low risk.  Patient was taken off ACE inhibitor due to hyperkalemia and ARF, taken off statin due to cirrhosis, and taken off sulfonylurea due to recurrent hypoglycemia.  Cough, apetite strength and dyspnea improved on antibiotics, but leukocytosis remained.  This will need to be followed up as outpatient.  Euvolemic at discharge.  Total time greater than 30 minutes.  Discharge Exam:  Blood pressure 123/68, pulse 81, temperature 98.3 F (36.8 C), temperature source Oral, resp. rate 18, height 5\' 3"  (1.6 m), weight 75.101 kg (165 lb 9.1 oz), SpO2 98.00%.  Gen: comfortable, alert and oriented Lungs CTA CV RRR Abd S,NT,ND Ext no cce  Signed: Treesa Mccully L 07/07/2012, 10:58 AM

## 2012-07-07 NOTE — Progress Notes (Signed)
[facility] Progress Note  Subjective:   No emerging complaints. Feels that his edema is improving  Objective Filed Vitals:   07/06/12 1800 07/06/12 2005 07/07/12 0424 07/07/12 0900  BP: 114/70 111/64 122/64 123/68  Pulse: 77 84 83 81  Temp: 98 F (36.7 C) 99.2 F (37.3 C) 99.3 F (37.4 C) 98.3 F (36.8 C)  TempSrc: Oral Oral Oral Oral  Resp: 18 18 18 18   Height:  5\' 3"  (1.6 m)    Weight:  75.101 kg (165 lb 9.1 oz)    SpO2: 97% 97% 98% 98%   Physical Exam General: Icteric sclerae, alert, cooperative, NAD Heart: RRR Lungs: Mostly clear. No wheezes or rhonchi noted Abdomen: Distended, normal BS Extremities: 2+ LE edema   I/O Last 24 Hours: In: 1538 (PO 1198. IV 340) Out: 1600 (Urine)  Assessment/Plan: 1. AKI - Resolving. Creatinine 2.07< 2.55< 2.69. Fem cath removed 5/19. 2. Alcoholic cirrhosis/thrombocytopenia/splenomegaly - per primary. Wishes to stop drinking. PO Zithromax and Ceftin continued for possible CAP. WBC's remain elevated but can be followed op. Refuses bx for liver nodule. 3. Elevated Troponin - Per primary. Normal EF, no ischemia on nuclear stress test. 4. Anemia - Hgb 8.6. Stable. asymptomatic 5. HTN/volume - BP controlled on metoprolol BID. Still with significant LE edema. IV lasix 80 mg on 5/19. K+ 4.2. Will add po Lasix 40 BID for now. Needs close follow up after discharge. 6. DM II - A.M episodes of hypoglycemia. Otherwise stable. 7. Dispo - Possibly today or tomorrow  Scot Jun. Broadus John, PA-C [facility] Pager [phone] 07/07/2012,10:39 AM  LOS: 7 days   Patient seen and examined.  Agree with assessment and plan as above.  OK for discharge from renal standpoint, discussed w Dr Lendell Caprice. Vinson Moselle  MD [phone] pgr    [phone] cell 07/07/2012, 1:18 PM   Additional Objective Labs: Basic Metabolic Panel:  Recent Labs Lab 07/04/12 0615 07/05/12 0555 07/06/12 0555  NA 132* 132* 134*  K 4.3 4.3 4.2  CL 97 97  97  CO2 23 28 27   GLUCOSE 77 132* 85  BUN 71* 74* 74*  CREATININE 2.69* 2.55* 2.07*  CALCIUM 7.8* 8.0* 8.1*  PHOS 5.8* 4.9* 4.0   Liver Function Tests:  Recent Labs Lab 07/03/12 0601 07/04/12 0615 07/05/12 0555 07/06/12 0555  AST 394* 364*  --  211*  ALT 146* 134*  --  83*  ALKPHOS 469* 484*  --  375*  BILITOT 3.7* 4.1*  --  6.1*  PROT 6.4 6.4  --  6.4  ALBUMIN 1.4* 1.3* 1.6* 1.8*   No results found for this basename: LIPASE, AMYLASE,  in the last 168 hours CBC:  Recent Labs Lab 07/02/12 0240 07/03/12 0601 07/04/12 0615 07/05/12 0555 07/06/12 0555  WBC 25.6* 20.9* 19.1* 20.3* 19.3*  HGB 8.9* 9.7* 9.6* 9.0* 8.6*  HCT 23.7* 25.5* 25.1* 24.7* 23.7*  MCV 89.8 88.9 88.7 90.8 91.5  PLT 77* 71* 68* 61* 67*   Blood Culture    Component Value Date/Time   SDES BLOOD RIGHT ARM 07/02/2012 0245   SPECREQUEST BOTTLES DRAWN AEROBIC AND ANAEROBIC 6CC BLUE,4CC RED 07/02/2012 0245   CULT        BLOOD CULTURE RECEIVED NO GROWTH TO DATE CULTURE WILL BE HELD FOR 5 DAYS BEFORE ISSUING A FINAL NEGATIVE REPORT 07/02/2012 0245   REPTSTATUS PENDING 07/02/2012 0245    Cardiac Enzymes:  Recent Labs Lab 06/30/12 1656 06/30/12 2135 07/01/12 0320  CKTOTAL 494* 499* 538*  CKMB 11.8* 10.6* 9.7*  TROPONINI 1.64* CRITICAL RESULT CALLED TO, READ BACK BY AND VERIFIED WITH: 1.52*   CBG:  Recent Labs Lab 07/06/12 1750 07/06/12 2008 07/06/12 2345 07/07/12 0429 07/07/12 0743  GLUCAP 86 161* 165* 129* 134*   Studies/Results: No results found. Medications:   . azithromycin  500 mg Oral Daily  . benzonatate  100 mg Oral TID  . cefUROXime  500 mg Oral BID WC  . clotrimazole  10 mg Oral 5 X Daily  . folic acid  1 mg Oral Daily  . metoprolol tartrate  25 mg Oral BID  . multivitamin with minerals  1 tablet Oral Daily  . sodium chloride  3 mL Intravenous Q12H  . thiamine  100 mg Oral Daily

## 2012-07-07 NOTE — Progress Notes (Signed)
Patient was discharged home with wife. Patient was given discharge instructions and prescriptions. Patient was given information on alcoholism and treatment options. Patient was stable upon discharge. Told by charge nurse that patient was given a rolling walker by mistake while being discharged. This was meant to be given to another patient being discharged. Patient was stable upon discharge.

## 2012-07-08 LAB — CULTURE, BLOOD (ROUTINE X 2)

## 2012-07-16 ENCOUNTER — Encounter: Payer: Self-pay | Admitting: Internal Medicine

## 2012-07-20 ENCOUNTER — Other Ambulatory Visit (INDEPENDENT_AMBULATORY_CARE_PROVIDER_SITE_OTHER): Payer: Managed Care, Other (non HMO)

## 2012-07-20 ENCOUNTER — Telehealth: Payer: Self-pay | Admitting: Gastroenterology

## 2012-07-20 ENCOUNTER — Encounter: Payer: Self-pay | Admitting: Internal Medicine

## 2012-07-20 ENCOUNTER — Other Ambulatory Visit: Payer: Self-pay | Admitting: Gastroenterology

## 2012-07-20 ENCOUNTER — Ambulatory Visit (INDEPENDENT_AMBULATORY_CARE_PROVIDER_SITE_OTHER): Payer: Managed Care, Other (non HMO) | Admitting: Internal Medicine

## 2012-07-20 VITALS — BP 98/60 | HR 88 | Ht 63.0 in | Wt 166.5 lb

## 2012-07-20 DIAGNOSIS — R188 Other ascites: Secondary | ICD-10-CM

## 2012-07-20 DIAGNOSIS — K766 Portal hypertension: Secondary | ICD-10-CM

## 2012-07-20 DIAGNOSIS — K7460 Unspecified cirrhosis of liver: Secondary | ICD-10-CM

## 2012-07-20 DIAGNOSIS — K769 Liver disease, unspecified: Secondary | ICD-10-CM

## 2012-07-20 DIAGNOSIS — N179 Acute kidney failure, unspecified: Secondary | ICD-10-CM

## 2012-07-20 DIAGNOSIS — K7689 Other specified diseases of liver: Secondary | ICD-10-CM

## 2012-07-20 DIAGNOSIS — A048 Other specified bacterial intestinal infections: Secondary | ICD-10-CM

## 2012-07-20 LAB — COMPREHENSIVE METABOLIC PANEL
ALT: 60 U/L — ABNORMAL HIGH (ref 0–53)
AST: 114 U/L — ABNORMAL HIGH (ref 0–37)
Albumin: 1.5 g/dL — ABNORMAL LOW (ref 3.5–5.2)
Alkaline Phosphatase: 306 U/L — ABNORMAL HIGH (ref 39–117)
Calcium: 8.3 mg/dL — ABNORMAL LOW (ref 8.4–10.5)
Chloride: 100 mEq/L (ref 96–112)
Potassium: 4.1 mEq/L (ref 3.5–5.1)
Sodium: 136 mEq/L (ref 135–145)

## 2012-07-20 LAB — CBC
MCHC: 34.3 g/dL (ref 30.0–36.0)
MCV: 99.6 fl (ref 78.0–100.0)
RDW: 20.2 % — ABNORMAL HIGH (ref 11.5–14.6)

## 2012-07-20 LAB — AFP TUMOR MARKER: AFP-Tumor Marker: 18 ng/mL — ABNORMAL HIGH (ref 0.0–8.0)

## 2012-07-20 NOTE — Telephone Encounter (Signed)
Sent letter with MRI scheduling to pt's home address we have on file.

## 2012-07-20 NOTE — Progress Notes (Signed)
Subjective:    Patient ID: Arthur Woodard, male    DOB: 11/15/1960, 52 y.o.   MRN: [id number]  HPI Arthur Woodard is a 52 yo male with PMH of alcoholic cirrhosis with portal hypertension manifested by thrombocytopenia and splenomegaly with ongoing alcohol abuse until approximately 4 weeks ago, liver lesions of unclear significance with mild elevation and AFP (patient appears to refuse biopsy) diabetes, hypertension, H. pylori infection treated, adenomatous colon polyps who is seen for hospital followup. He was admitted to the hospital in May of 2014 for acute kidney injury requiring dialysis, volume overload with hyperkalemia, alcoholic hepatitis with known cirrhosis, elevated troponin with no inducible ischemia after stress testing. He returns today for followup. He reports he has stopped drinking alcohol entirely with his last drink about 4 weeks ago. He did require dialysis in the hospital but was discharged on Lasix 40 mg twice daily. He continues to report ongoing issues with abdominal distention and lower extremity edema. He denies chest pain or dyspnea today. He's had overall poor appetite.  No nausea or vomiting. No significant diarrhea, constipation, rectal bleeding or melena. He denies abdominal pain today. He reports he did not have any followup with nephrology, but has been seen by cardiology and primary care.  He was due to have a MRI to followup on liver lesions but this was canceled by the patient. It also was not performed in the hospital due to renal failure.   Review of Systems As per history of present illness, otherwise negative  Current Medications, Allergies, Past Medical History, Past Surgical History, Family History and Social History were reviewed in [employer] record.     Objective:   Physical Exam BP 98/60  Pulse 88  Ht 5\' 3"  (1.6 m)  Wt 166 lb 8 oz (75.524 kg)  BMI 29.5 kg/m2 Constitutional: Chronically ill-appearing in no acute  distress HEENT: Normocephalic and atraumatic. Oropharynx is clear and moist. No oropharyngeal exudate. Conjunctivae are normal.  Icteric sclera Neck: Neck supple. Trachea midline. Cardiovascular: Normal rate, regular rhythm and intact distal pulses. SEM Pulmonary/chest: Effort normal and breath sounds normal. No wheezing, rales or rhonchi. Abdominal: Soft, distended but nontender  Bowel sounds active throughout. There are no masses palpable.  Extremities: no clubbing, cyanosis, with 3+ LE edema Neurological: Alert and oriented to person place and time. Skin: Skin is warm and dry. No rashes noted. Psychiatric: Normal mood and affect. Behavior is normal.  CBC    Component Value Date/Time   WBC 9.6 07/20/2012 1117   WBC 3.5* 11/15/2011 0926   RBC 3.02* 07/20/2012 1117   RBC 3.49* 11/15/2011 0926   HGB 10.3* 07/20/2012 1117   HGB 12.6* 11/15/2011 0926   HCT 30.1* 07/20/2012 1117   HCT 36.0* 11/15/2011 0926   PLT 136.0* 07/20/2012 1117   PLT 69* 11/15/2011 0926   MCV 99.6 07/20/2012 1117   MCV 103.2* 11/15/2011 0926   MCH 33.2 07/06/2012 0555   MCH 36.0* 11/15/2011 0926   MCHC 34.3 07/20/2012 1117   MCHC 34.9 11/15/2011 0926   RDW 20.2* 07/20/2012 1117   RDW 13.6 11/15/2011 0926   LYMPHSABS 2.2 06/30/2012 0923   LYMPHSABS 1.2 11/15/2011 0926   MONOABS 4.1* 06/30/2012 0923   MONOABS 0.6 11/15/2011 0926   EOSABS 0.0 06/30/2012 0923   EOSABS 0.2 11/15/2011 0926   BASOSABS 0.0 06/30/2012 0923   BASOSABS 0.0 11/15/2011 0926    CMP     Component Value Date/Time   NA 136 07/20/2012 1117  NA 137 11/15/2011 0926   K 4.1 07/20/2012 1117   K 4.5 11/15/2011 0926   CL 100 07/20/2012 1117   CL 106 11/15/2011 0926   CO2 29 07/20/2012 1117   CO2 22 11/15/2011 0926   GLUCOSE 85 07/20/2012 1117   GLUCOSE 120* 11/15/2011 0926   GLUCOSE 116* 02/10/2006 1102   BUN 35* 07/20/2012 1117   BUN 15.0 11/15/2011 0926   CREATININE 1.3 07/20/2012 1117   CREATININE 0.9 11/15/2011 0926   CALCIUM 8.3* 07/20/2012 1117   CALCIUM 8.4 11/15/2011 0926    PROT 7.7 07/20/2012 1117   PROT 7.2 11/15/2011 0926   ALBUMIN 1.5* 07/20/2012 1117   ALBUMIN 2.7* 11/15/2011 0926   AST 114* 07/20/2012 1117   AST 135* 11/15/2011 0926   ALT 60* 07/20/2012 1117   ALT 92* 11/15/2011 0926   ALKPHOS 306* 07/20/2012 1117   ALKPHOS 342* 11/15/2011 0926   BILITOT 7.6* 07/20/2012 1117   BILITOT 1.30* 11/15/2011 0926   GFRNONAA 44* 07/07/2012 1044   GFRAA 51* 07/07/2012 1044   INR 3.8  MELD today = 32    Assessment & Plan:  52 yo male with PMH of alcoholic cirrhosis with portal hypertension manifested by thrombocytopenia and splenomegaly with ongoing alcohol abuse until approximately 4 weeks ago, liver lesions of unclear significance with mild elevation and AFP (patient appears to refuse biopsy) diabetes, hypertension, H. pylori infection treated, adenomatous colon polyps who is seen for hospital followup.  1.  Cirrhosis/portal HTN/ascites/thrombocytopenia -- unfortunately he has had continued and further decompensation of his liver disease over the last several weeks to months. His bilirubin has increased to 7.6 and his INR is 3.8. Fortunately his creatinine has improved since discharge and was 1.3 today. His MELD score is 32 which portends a very poor prognosis.  His biggest complaint today is ongoing volume overload, but given his recent acute kidney injury I am somewhat hesitant to increase diuretics until he has followed up with nephrology.  We'll contact nephrology for an outpatient appointment as soon as possible.  If they're okay increasing diuretics and I would likely add Aldactone and consider further increasing furosemide. Obviously, his renal function would need to be watched closely with diuretic management.  We can also consider large-volume paracentesis with IV albumin after nephrology appointment.  He has been alcohol free for about a month, and I have again strongly advised that he never drank alcohol again. His mortality is extremely high now and would only increase if  he starts drinking alcohol again. He voices understanding. I also asked that he follow a very low sodium diet. --Fortunately his thrombocytopenia is slightly improved, possibly due to mild improvement in portal hypertension without alcohol --Hemoglobin is also increased since hospital discharge which is reassuring against active bleeding, I expect some of the decline was malnutrition, alcohol abuse and poor renal function --Variceal screening, he has had a decompensation therefore repeat upper endoscopy to look for varices was considered. However he is already on beta blocker which may need to be titrated. However given his blood pressure today I did not increase his metoprolol. We will look at this again at followup  2.  Recent AKI, improving as of hospital discharge -- I would like him to be seen by nephrology this week to help with diuretic management in the setting of recent acute kidney injury.  3.  Hx of H. Pylori -- treated.  Stool antigen to confirm eradication  4.  Liver lesions/concern for HCC -- elevated AFP in  the past and suspicious liver lesions which need to be reimaged. With his creatinine currently, we can proceed with MRI with contrast. If he does have HCC this could be driving, in some part, his decompensation. The past he has been opposed to biopsy, but we can rediscuss this based on MRI findings  He should followup in 2 weeks

## 2012-07-20 NOTE — Patient Instructions (Addendum)
You have been referred to [facility], Dr. Delano Metz, We will call you back with that appointment information.  Stick with the low Sodium diet. No Alcohol consumption    How Much is Too Much Alcohol? Drinking too much alcohol can cause injury, accidents, and health problems. These types of problems can include:   Car crashes.  Falls.  Family fighting (domestic violence).  Drowning.  Fights.  Injuries.  Burns.  Damage to certain organs.  Having a baby with birth defects. ONE DRINK CAN BE TOO MUCH WHEN YOU ARE:  Working.  Pregnant or breastfeeding.  Taking medicines. Ask your doctor.  Driving or planning to drive. WHAT IS A STANDARD DRINK?   1 regular beer (12 ounces or 360 milliliters).  1 glass of wine (5 ounces or 150 milliliters).  1 shot of liquor (1.5 ounces or 45 milliliters). BLOOD ALCOHOL LEVELS   .00 A person is sober.  Marland Kitchen03 A person has no trouble keeping balance, talking, or seeing right, but a "buzz" may be felt.  Marland Kitchen05 A person feels "buzzed" and relaxed.  Marland Kitchen08 or .10  A person is drunk. He or she has trouble talking, seeing right, and keeping his or her balance.  .15 A person loses body control and may pass out (blackout).  .20 A person has trouble walking (staggering) and throws up (vomits).  .30 A person will pass out (unconscious).  .40+ A person will be in a coma. Death is possible. If you or someone you know has a drinking problem, get help from a doctor.  Document Released: 12/01/2008 Document Revised: 04/29/2011 Document Reviewed: 12/01/2008 ExitCare Patient Information 2014 ExitCare, [name]. 1.5 Gram Low Sodium Diet A 1.5 gram sodium diet restricts the amount of sodium in the diet to no more than 1.5 g or 1500 mg daily. The American Heart Association recommends Americans over the age of 35 to consume no more than 1500 mg of sodium each day to reduce the risk of developing high blood pressure. Research also shows that  limiting sodium may reduce heart attack and stroke risk. Many foods contain sodium for flavor and sometimes as a preservative. When the amount of sodium in a diet needs to be low, it is important to know what to look for when choosing foods and drinks. The following includes some information and guidelines to help make it easier for you to adapt to a low sodium diet. QUICK TIPS  Do not add salt to food.  Avoid convenience items and fast food.  Choose unsalted snack foods.  Buy lower sodium products, often labeled as "lower sodium" or "no salt added."  Check food labels to learn how much sodium is in 1 serving.  When eating at a restaurant, ask that your food be prepared with less salt or none, if possible. READING FOOD LABELS FOR SODIUM INFORMATION The nutrition facts label is a good place to find how much sodium is in foods. Look for products with no more than 400 mg of sodium per serving. Remember that 1.5 g = 1500 mg. The food label may also list foods as:  Sodium-free: Less than 5 mg in a serving.  Very low sodium: 35 mg or less in a serving.  Low-sodium: 140 mg or less in a serving.  Light in sodium: 50% less sodium in a serving. For example, if a food that usually has 300 mg of sodium is changed to become light in sodium, it will have 150 mg of sodium.  Reduced sodium:  25% less sodium in a serving. For example, if a food that usually has 400 mg of sodium is changed to reduced sodium, it will have 300 mg of sodium. CHOOSING FOODS Grains  Avoid: Salted crackers and snack items. Some cereals, including instant hot cereals. Bread stuffing and biscuit mixes. Seasoned rice or pasta mixes.  Choose: Unsalted snack items. Low-sodium cereals, oats, puffed wheat and rice, shredded wheat. English muffins and bread. Pasta. Meats  Avoid: Salted, canned, smoked, spiced, pickled meats, including fish and poultry. Bacon, ham, sausage, cold cuts, hot dogs, anchovies.  Choose: Low-sodium  canned tuna and salmon. Fresh or frozen meat, poultry, and fish. Dairy  Avoid: Processed cheese and spreads. Cottage cheese. Buttermilk and condensed milk. Regular cheese.  Choose: Milk. Low-sodium cottage cheese. Yogurt. Sour cream. Low-sodium cheese. Fruits and Vegetables  Avoid: Regular canned vegetables. Regular canned tomato sauce and paste. Frozen vegetables in sauces. Olives. Rosita Fire. Relishes. Sauerkraut.  Choose: Low-sodium canned vegetables. Low-sodium tomato sauce and paste. Frozen or fresh vegetables. Fresh and frozen fruit. Condiments  Avoid: Canned and packaged gravies. Worcestershire sauce. Tartar sauce. Barbecue sauce. Soy sauce. Steak sauce. Ketchup. Onion, garlic, and table salt. Meat flavorings and tenderizers.  Choose: Fresh and dried herbs and spices. Low-sodium varieties of mustard and ketchup. Lemon juice. Tabasco sauce. Horseradish. SAMPLE 1.5 GRAM SODIUM MEAL PLAN Breakfast / Sodium (mg)  1 cup low-fat milk / 143 mg  1 whole-wheat English muffin / 240 mg  1 tbs heart-healthy margarine / 153 mg  1 hard-boiled egg / 139 mg  1 small orange / 0 mg Lunch / Sodium (mg)  1 cup raw carrots / 76 mg  2 tbs no salt added peanut butter / 5 mg  2 slices whole-wheat bread / 270 mg  1 tbs jelly / 6 mg   cup red grapes / 2 mg Dinner / Sodium (mg)  1 cup whole-wheat pasta / 2 mg  1 cup low-sodium tomato sauce / 73 mg  3 oz lean ground beef / 57 mg  1 small side salad (1 cup raw spinach leaves,  cup cucumber,  cup yellow bell pepper) with 1 tsp olive oil and 1 tsp red wine vinegar / 25 mg Snack / Sodium (mg)  1 container low-fat vanilla yogurt / 107 mg  3 graham cracker squares / 127 mg Nutrient Analysis  Calories: 1745  Protein: 75 g  Carbohydrate: 237 g  Fat: 57 g  Sodium: 1425 mg Document Released: 02/04/2005 Document Revised: 04/29/2011 Document Reviewed: 05/08/2009 ExitCare Patient Information 2014 [place], [name].

## 2012-07-20 NOTE — Telephone Encounter (Signed)
See previous note

## 2012-07-20 NOTE — Telephone Encounter (Signed)
Faxed office note to Dr. Arlean Hopping,

## 2012-07-21 ENCOUNTER — Telehealth: Payer: Self-pay | Admitting: *Deleted

## 2012-07-21 ENCOUNTER — Other Ambulatory Visit: Payer: Self-pay | Admitting: Gastroenterology

## 2012-07-21 DIAGNOSIS — K7689 Other specified diseases of liver: Secondary | ICD-10-CM

## 2012-07-21 DIAGNOSIS — K7460 Unspecified cirrhosis of liver: Secondary | ICD-10-CM

## 2012-07-21 NOTE — Telephone Encounter (Signed)
Unable to reach pt by phone today and in the past. Mailed him a copy of his labs with Dr Lauro Franklin findings. Pt may call for questions.

## 2012-07-21 NOTE — Telephone Encounter (Signed)
Message copied by Florene Glen on Tue Jul 21, 2012  2:48 PM ------      Message from: Beverley Fiedler      Created: Mon Jul 20, 2012  3:58 PM       Remains anemic the hemoglobin has improved slightly since hospital discharge.      INR is very elevated indicating poor liver function, this means his blood is thin and he should avoid NSAIDs and aspirin      AFP remains mildly elevated we will await MRI as ordered      Liver function tests still abnormal with high bilirubin as a result of his cirrhosis/decompensated liver disease      Proceed as discussed in clinic ------

## 2012-07-30 ENCOUNTER — Other Ambulatory Visit: Payer: Self-pay | Admitting: Internal Medicine

## 2012-07-30 ENCOUNTER — Ambulatory Visit (HOSPITAL_COMMUNITY)
Admission: RE | Admit: 2012-07-30 | Discharge: 2012-07-30 | Disposition: A | Discharge status: Home / Self Care | Payer: Managed Care, Other (non HMO) | Source: Ambulatory Visit | Attending: Internal Medicine | Admitting: Internal Medicine

## 2012-07-30 DIAGNOSIS — R188 Other ascites: Secondary | ICD-10-CM | POA: Insufficient documentation

## 2012-07-30 DIAGNOSIS — K7460 Unspecified cirrhosis of liver: Secondary | ICD-10-CM

## 2012-07-30 DIAGNOSIS — I8510 Secondary esophageal varices without bleeding: Secondary | ICD-10-CM | POA: Insufficient documentation

## 2012-07-30 DIAGNOSIS — J869 Pyothorax without fistula: Secondary | ICD-10-CM | POA: Insufficient documentation

## 2012-07-30 MED ORDER — GADOBENATE DIMEGLUMINE 529 MG/ML IV SOLN
15.0000 mL | Freq: Once | INTRAVENOUS | Status: AC | PRN
  Administered 2012-07-30: 15 mL via INTRAVENOUS

## 2012-08-03 ENCOUNTER — Telehealth: Payer: Self-pay | Admitting: *Deleted

## 2012-08-03 NOTE — Telephone Encounter (Signed)
Pt called and his wife can't bring him until Thursday; r/s pt for 08/06/12 at the [place] [facility] at [address], [address], [phone].

## 2012-08-03 NOTE — Telephone Encounter (Signed)
Message copied by Florene Glen on Mon Aug 03, 2012 10:02 AM ------      Message from: Beverley Fiedler      Created: Mon Aug 03, 2012  8:39 AM       Most concerning finding is right lung fluid collection which may represent an infection.  He needs to be seen by pulmonary ASAP.      Liver lesions are still present, could represent small liver tumors, but they are not increased in size.  We will continue with serial imaging for now.  Plan repeat MRI for liver lesions in 6 months.      Large vol ascites, attempting medical management, diuretics recently increased      He has follow-up with me next week, but needs fluid collection in right lung evaluated now. ------

## 2012-08-03 NOTE — Telephone Encounter (Signed)
Informed pt of the need for a Pulmonary referral; earliest appt is tomorrow but at [facility]. Pt will have his wife call me back, since she is the driver. appt tomorrow with Dr Vassie Loll at 2:30pm at [address], [phone].

## 2012-08-03 NOTE — Telephone Encounter (Addendum)
lmom for pt to let me know if he can keep 08/06/12 appt. After many phone calls and per Dr Thurston Hole schedule, he will see Dr Sherene Sires Friday, 08/07/12 at 1:45pm at [facility] location. Pt stated understanding.

## 2012-08-04 ENCOUNTER — Institutional Professional Consult (permissible substitution): Payer: Managed Care, Other (non HMO) | Admitting: Pulmonary Disease

## 2012-08-06 ENCOUNTER — Ambulatory Visit: Payer: Managed Care, Other (non HMO) | Admitting: Internal Medicine

## 2012-08-07 ENCOUNTER — Encounter: Payer: Self-pay | Admitting: Internal Medicine

## 2012-08-07 ENCOUNTER — Ambulatory Visit (INDEPENDENT_AMBULATORY_CARE_PROVIDER_SITE_OTHER): Payer: Managed Care, Other (non HMO) | Admitting: Internal Medicine

## 2012-08-07 ENCOUNTER — Ambulatory Visit (INDEPENDENT_AMBULATORY_CARE_PROVIDER_SITE_OTHER)
Admission: RE | Admit: 2012-08-07 | Discharge: 2012-08-07 | Disposition: A | Discharge status: Home / Self Care | Payer: Managed Care, Other (non HMO) | Source: Ambulatory Visit | Attending: Internal Medicine | Admitting: Internal Medicine

## 2012-08-07 VITALS — BP 100/60 | HR 91 | Temp 99.8°F | Ht 62.0 in | Wt 169.6 lb

## 2012-08-07 DIAGNOSIS — J90 Pleural effusion, not elsewhere classified: Secondary | ICD-10-CM

## 2012-08-07 NOTE — Progress Notes (Signed)
  Subjective:    Patient ID: Arthur Woodard, male    DOB: 10-01-1960   MRN: [id number]  HPI  52 yobm from [place] with Etoh cirrhosis, hbp, dm referred by Dr Rhea Belton to pulmonary clinic 08/07/2012 for eval of R Pleural effusion.   08/07/2012 1st pulmonary eval  Chief Complaint  Patient presents with  . Pulmonary Consult    Referred per Dr Rhea Belton for pleural effusion. The pt c/o having SOB and cough for the past 3 months. Cough is non prod and seems worse at hs.   able to lie down at about 30 degrees comfortably all night with breathing and cough correlating with abd distention. Reports just quit all drinking.  Onset was indolent, pattern was persistent and daily with doe and dry cough but not symptoms sitting at rest.   No obvious daytime variabilty or assoc chronic cough or cp or chest tightness, subjective wheeze overt sinus or hb symptoms. No unusual exp hx or h/o childhood pna/ asthma or knowledge of premature birth.   Sleeping ok without nocturnal  or early am exacerbation  of respiratory  c/o's or need for noct saba. Also denies any obvious fluctuation of symptoms with weather or environmental changes or other aggravating or alleviating factors except as outlined above   Current Medications, Allergies, Past Medical History, Past Surgical History, Family History, and Social History were reviewed in [employer] record.           Review of Systems  Constitutional: Positive for appetite change and unexpected weight change. Negative for fever, chills and activity change.  HENT: Negative for congestion, sore throat, rhinorrhea, sneezing, trouble swallowing, dental problem, voice change and postnasal drip.   Eyes: Negative for visual disturbance.  Respiratory: Positive for cough and shortness of breath. Negative for choking.   Cardiovascular: Negative for chest pain and leg swelling.  Gastrointestinal: Negative for nausea, vomiting and abdominal pain.   Genitourinary: Negative for difficulty urinating.  Musculoskeletal: Negative for arthralgias.  Skin: Negative for rash.  Psychiatric/Behavioral: Negative for behavioral problems and confusion.       Objective:   Physical Exam   amb bm nad Wt Readings from Last 3 Encounters:  08/07/12 169 lb 9.6 oz (76.93 kg)  07/20/12 166 lb 8 oz (75.524 kg)  07/06/12 165 lb 9.1 oz (75.101 kg)     HEENT: nl dentition, turbinates, and orophanx. Nl external ear canals without cough reflex   NECK :  without JVD/Nodes/TM/ nl carotid upstrokes bilaterally   LUNGS: no acc muscle use, minimal decreased bs with dullness R Base   CV:  RRR  no s3 or murmur or increase in P2, 2 plus ptting sym le edema   ABD:  soft and mod distended with shifting dullness c/w ascites  MS:  warm without deformities, calf tenderness, cyanosis or clubbing  SKIN: warm and dry without lesions    NEURO:  alert, approp, no deficits    CXR  08/07/2012 :   Persistent right lower lobe infiltrate.      Assessment & Plan:

## 2012-08-07 NOTE — Patient Instructions (Addendum)
Please remember to go to the  x-ray department downstairs for your tests - we will call you with the results when they are available.  Please schedule a follow up office visit in 4 weeks, sooner if needed if you lie down at night at the same angle you are now

## 2012-08-10 NOTE — Assessment & Plan Note (Addendum)
This appears somewhat loculated which would be worrisome for empyema if not for the assoc with ascites and the improvement with diuresis so most likely has adhesions and unrelated "thoracic ascites" which should respond to diuretic therapy and if not will need IR directed thoracentesis assuming coags will allow it  Discussed in detail all the  indications, usual  risks and alternatives  relative to the benefits with patient who agrees to proceed with conservative f/u  Using ability to lie down at baseline angle (which hasn't been flat for a long time)

## 2012-08-12 ENCOUNTER — Encounter: Payer: Self-pay | Admitting: Internal Medicine

## 2012-08-13 ENCOUNTER — Ambulatory Visit (INDEPENDENT_AMBULATORY_CARE_PROVIDER_SITE_OTHER): Payer: Managed Care, Other (non HMO) | Admitting: Internal Medicine

## 2012-08-13 ENCOUNTER — Encounter: Payer: Self-pay | Admitting: Internal Medicine

## 2012-08-13 VITALS — BP 110/60 | HR 84 | Ht 63.0 in | Wt 157.6 lb

## 2012-08-13 DIAGNOSIS — J90 Pleural effusion, not elsewhere classified: Secondary | ICD-10-CM

## 2012-08-13 DIAGNOSIS — E8779 Other fluid overload: Secondary | ICD-10-CM

## 2012-08-13 DIAGNOSIS — R188 Other ascites: Secondary | ICD-10-CM

## 2012-08-13 DIAGNOSIS — M109 Gout, unspecified: Secondary | ICD-10-CM

## 2012-08-13 DIAGNOSIS — R933 Abnormal findings on diagnostic imaging of other parts of digestive tract: Secondary | ICD-10-CM

## 2012-08-13 DIAGNOSIS — K7689 Other specified diseases of liver: Secondary | ICD-10-CM

## 2012-08-13 DIAGNOSIS — K7460 Unspecified cirrhosis of liver: Secondary | ICD-10-CM

## 2012-08-13 DIAGNOSIS — E8770 Fluid overload, unspecified: Secondary | ICD-10-CM

## 2012-08-13 MED ORDER — PREDNISONE 10 MG PO TABS: 10.0000 mg | ORAL_TABLET | Freq: Every day | ORAL | Status: DC

## 2012-08-13 NOTE — Patient Instructions (Addendum)
You have been scheduled a [facility] Doppler at [facility] 08/14/2012 @ 11:00am please arrive at 10:45am  We have sent the following medications to your pharmacy for you to pick up at your convenience: prednisone take as directed  Follow up with Dr. Rhea Belton in 1 month and continue to follow up with [facility]                                               We are excited to introduce MyChart, a new best-in-class service that provides you online access to important information in your electronic medical record. We want to make it easier for you to view your health information - all in one secure location - when and where you need it. We expect MyChart will enhance the quality of care and service we provide.  When you register for MyChart, you can:    View your test results.    Request appointments and receive appointment reminders via email.    Request medication renewals.    View your medical history, allergies, medications and immunizations.    Communicate with your physician's office through a password-protected site.    Conveniently print information such as your medication lists.  To find out if MyChart is right for you, please talk to a member of our clinical staff today. We will gladly answer your questions about this free health and wellness tool.  If you are age 4 or older and want a member of your family to have access to your record, you must provide written consent by completing a proxy form available at our office. Please speak to our clinical staff about guidelines regarding accounts for patients younger than age 22.  As you activate your MyChart account and need any technical assistance, please call the MyChart technical support line at [phone] [phone]) or email your question to [email or web address]. If you email your question(s), please include your name, a return phone number and the best time to reach you.  If you have non-urgent health-related  questions, you can send a message to our office through MyChart at [email or web address]. If you have a medical emergency, call 911.  Thank you for using MyChart as your new health and wellness resource!   MyChart licensed from [employer],  [id number]. Patents Pending.

## 2012-08-13 NOTE — Progress Notes (Signed)
Subjective:    Patient ID: Arthur Woodard, male    DOB: 13-Aug-1960, 52 y.o.   MRN: [id number]  HPI Mr. Aull is a 52 yo male with PMH of alcoholic cirrhosis with portal hypertension manifested by ascites, thrombocytopenia, splenomegaly, liver lesions of unclear significance, diabetes, hypertension, H. pylori infection treated, adenomatous colon polyps, and recent acute kidney injury who is seen in follow-up.  Since last being seen here he has followed up with his renal doctor, Dr.Deterding.  Dr. Darrick Penna increased his diuretics while closely watching his renal function. He did not feel that paracentesis was appropriate at this time due to instability of renal function. He is now taking furosemide 80 mg twice daily and spironolactone 50 mg daily.  He's had significant improvement in lower extremity edema and abdominal girth. He also feels that his breathing has improved. He was also seen after his MRI by Dr. Sherene Sires to evaluate a loculated right pleural effusion which was thought may be an empyema. It was felt that given his abdominal ascites this most likely represented a loculated hydrothorax rather than infection. The patient feels his breathing is better with diuresis but he still has some nocturnal coughing. Over the last several days he's noted significant pain in his right great toe which is now spread to involve his right knee. The pain is worse with movement and he is now using a walker to ambulate.  He has been able to avoid alcohol altogether though he is unhappy with this. It has now been about 6 weeks since his last drink. Appetite has remained somewhat poor but he is eating. No nausea or vomiting. No significant diarrhea or constipation. They noted his eyes remain yellow. No rectal bleeding or melena.  Review of Systems As per history of present illness, otherwise negative  Current Medications, Allergies, Past Medical History, Past Surgical History, Family History and Social History were  reviewed in [employer] record.     Objective:   Physical Exam BP 110/60  Pulse 84  Ht 5\' 3"  (1.6 m)  Wt 157 lb 9.6 oz (71.487 kg)  BMI 27.92 kg/m2 Constitutional: Chronically ill-appearing in no acute distress  HEENT: Normocephalic and atraumatic. Oropharynx is clear and moist. No oropharyngeal exudate. Conjunctivae are normal. Icteric sclera  Cardiovascular: Normal rate, regular rhythm and intact distal pulses. SEM  Pulmonary/chest: Effort normal and breath sounds normal. Decreased breath sounds right base Abdominal: Soft, less distention and nontender, Bowel sounds active throughout.  Extremities: no clubbing, cyanosis, with 1-2+ left lower extremity edema, 3+ right lower extremity edema, pain with palpation of the right great toe MCP and the right knee, mild increase in warmth Neurological: Alert and oriented to person place and time.  Skin: Skin is warm and dry. No rashes noted.  Psychiatric: Normal mood and affect. Behavior is normal.  CHEST - 2 VIEW -- 08/11/12   Comparison: 07/01/2012   Findings: The cardiac shadow is stable.  There is decreased pleural effusion when compared with the prior exam although persistent right lower lobe infiltrate is seen.  The infiltrate seen in the medial aspect of the right upper lung on the prior exam has resolved in the interval.  Mild fluid is noted within the minor fissure.   IMPRESSION: Persistent right lower lobe infiltrate.   Improved effusion.   Improved aeration in the right upper lobe. _______________________________________________________________________________    MRI ABDOMEN WITH AND WITHOUT CONTRAST -- 08/11/12   Technique:  Multiplanar multisequence MR imaging of the abdomen was performed  both before and after administration of intravenous contrast.   Contrast: 15mL MULTIHANCE GADOBENATE DIMEGLUMINE 529 MG/ML IV SOLN   Comparison: 11/04/2011   Findings: Motion degraded images.    Thick-walled, loculated fluid collection/empyema at the right lung base, measuring 4.1 x 8.4 cm.  Associated right lower lobe opacity / consolidation, possibly reflecting pneumonia (series 4/image 12).   Cirrhosis.   Again noted are at least four hepatic nodules, as follows: --8 mm nodule in the anterior right hepatic dome (series 1201/image 32) --9 mm nodule in the subcapsular right hepatic lobe (series 1201/image 39) --10 mm nodule in the medial segment left hepatic lobe (series 1201/image 51) --9 mm nodule in the anterior segment right hepatic lobe (series 1201/image 51)   These nodules appear stable versus mildly decreased from the prior study.  All nodules demonstrate early arterial enhancement, normalize on additional phases, and do not demonstrate delayed washout.  No definite restricted diffusion.  The overall appearance suggests dysplastic nodules.   Spleen is normal in size, measuring 11.1 cm.   Pancreas and adrenal glands are unremarkable.   Gallbladder is distended.  No definite cholelithiasis or associated inflammatory changes.  No intrahepatic or extrahepatic ductal dilatation.   Kidneys are within normal limits.  No hydronephrosis.   Large volume abdominal ascites.   Gastroesophageal varices (series 1202/image 55).  Portal vein is patent.  Recanalized periumbilical vein.   IMPRESSION: Motion degraded images.   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  Continued follow- up/surveillance is suggested.   Stigmata of portal hypertension including gastroesophageal varices, recanalized periumbilical vein, and large volume abdominal ascites. Portal vein is patent.   Distended gallbladder without definite cholelithiasis or inflammatory changes.   4.1 x 8.4 cm thick-walled fluid collection/empyema at the right lung base.  Associated right lower lobe opacity, possibly pneumonia.    CMP     Component Value Date/Time   NA 136 07/20/2012 1117   NA 137 11/15/2011 0926   K 4.1 07/20/2012 1117   K 4.5 11/15/2011 0926   CL 100 07/20/2012 1117   CL 106 11/15/2011 0926   CO2 29 07/20/2012 1117   CO2 22 11/15/2011 0926   GLUCOSE 85 07/20/2012 1117   GLUCOSE 120* 11/15/2011 0926   GLUCOSE 116* 02/10/2006 1102   BUN 35* 07/20/2012 1117   BUN 15.0 11/15/2011 0926   CREATININE 1.3 07/20/2012 1117   CREATININE 0.9 11/15/2011 0926   CALCIUM 8.3* 07/20/2012 1117   CALCIUM 8.4 11/15/2011 0926   PROT 7.7 07/20/2012 1117   PROT 7.2 11/15/2011 0926   ALBUMIN 1.5* 07/20/2012 1117   ALBUMIN 2.7* 11/15/2011 0926   AST 114* 07/20/2012 1117   AST 135* 11/15/2011 0926   ALT 60* 07/20/2012 1117   ALT 92* 11/15/2011 0926   ALKPHOS 306* 07/20/2012 1117   ALKPHOS 342* 11/15/2011 0926   BILITOT 7.6* 07/20/2012 1117   BILITOT 1.30* 11/15/2011 0926   GFRNONAA 44* 07/07/2012 1044   GFRAA 51* 07/07/2012 1044    CBC    Component Value Date/Time   WBC 9.6 07/20/2012 1117   WBC 3.5* 11/15/2011 0926   RBC 3.02* 07/20/2012 1117   RBC 3.49* 11/15/2011 0926   HGB 10.3* 07/20/2012 1117   HGB 12.6* 11/15/2011 0926   HCT 30.1* 07/20/2012 1117   HCT 36.0* 11/15/2011 0926   PLT 136.0* 07/20/2012 1117   PLT 69* 11/15/2011 0926   MCV 99.6 07/20/2012 1117   MCV 103.2* 11/15/2011  0926   MCH 33.2 07/06/2012 0555   MCH 36.0* 11/15/2011 0926   MCHC 34.3 07/20/2012 1117   MCHC 34.9 11/15/2011 0926   RDW 20.2* 07/20/2012 1117   RDW 13.6 11/15/2011 0926   LYMPHSABS 2.2 06/30/2012 0923   LYMPHSABS 1.2 11/15/2011 0926   MONOABS 4.1* 06/30/2012 0923   MONOABS 0.6 11/15/2011 0926   EOSABS 0.0 06/30/2012 0923   EOSABS 0.2 11/15/2011 0926   BASOSABS 0.0 06/30/2012 0923   BASOSABS 0.0 11/15/2011 0926    INR 3.8 AFP 18 (mildly elevated)     Assessment & Plan:  52 yo male with PMH of alcoholic cirrhosis with portal hypertension manifested by ascites, thrombocytopenia, splenomegaly, liver lesions of unclear significance, diabetes, hypertension, H. pylori infection  treated, adenomatous colon polyps, and recent acute kidney injury who is seen in follow-up  1.  decompensated alcoholic Cirrhosis/portal HTN/ascites/thrombocytopenia -- he looks slightly better today and has responded to diuresis. I greatly appreciate Dr. Deterding's help and assistance with diuresis and renal function.  I have asked that he do everything he can to never drink alcohol again. He is frustrated by this but understands why and at present doesn't plan to drink again. Given that he has responded to diuresis he will continue on current doses of diuretics and has followup with renal next week. I will avoid large volume paracentesis for now (it doesn't seem that he needs one at present anyway).   --Continue low-sodium diet --Alcohol avoidance --Variceal screening -- deferred for now given acute signs and symptoms, discuss again at followup  2.  right great toe pain and right knee pain -- my suspicion is acute gout flare, though I feel it is important to exclude right lower extremity DVT. I will order a venous Doppler on the right lower extremity. Colchicine and NSAIDs are contraindicated at this time and therefore I will treat him with a prednisone taper over 7 days. He is at risk for hyperglycemia and have asked that he monitor his blood sugars daily at home while taking prednisone. I will do prednisone 30 mg x3 days 20 mg x2 days and 10 mg x2 days. He just let me know if there is rebound after stopping steroids. I expect a diuresis has something to do with gout flare up, but the diuretics are paramount at this time  3. Liver lesions/HCC screening -- stable in size it recent MRI. They are felt to most likely be dysplastic. There are 4 lesions in total at this point. I recommended a repeat liver MRI with contrast in 6 months to again determine stability.  4.  Right pleural effusion -- improving by chest x-ray 2 days ago. No concerning respiratory symptoms today or change in pulmonary status. No  fevers. He is to followup with pulmonary  Return in one month, sooner if needed

## 2012-08-14 ENCOUNTER — Telehealth: Payer: Self-pay | Admitting: *Deleted

## 2012-08-14 ENCOUNTER — Ambulatory Visit (HOSPITAL_COMMUNITY)
Admission: RE | Admit: 2012-08-14 | Discharge: 2012-08-14 | Disposition: A | Discharge status: Home / Self Care | Payer: Managed Care, Other (non HMO) | Source: Ambulatory Visit | Attending: Internal Medicine | Admitting: Internal Medicine

## 2012-08-14 DIAGNOSIS — M79609 Pain in unspecified limb: Secondary | ICD-10-CM

## 2012-08-14 DIAGNOSIS — M7989 Other specified soft tissue disorders: Secondary | ICD-10-CM

## 2012-08-14 DIAGNOSIS — R52 Pain, unspecified: Secondary | ICD-10-CM

## 2012-08-14 DIAGNOSIS — R933 Abnormal findings on diagnostic imaging of other parts of digestive tract: Secondary | ICD-10-CM

## 2012-08-14 DIAGNOSIS — K7460 Unspecified cirrhosis of liver: Secondary | ICD-10-CM

## 2012-08-14 NOTE — Telephone Encounter (Signed)
Tried to reach pt, but phone mailbox not accepting calls.

## 2012-08-14 NOTE — Telephone Encounter (Signed)
Arthur Woodard called a verbal report on pt and needs the order corrected to read the right extremity not the left. Report is : negative for a blood clot, but positive for a Baker's cyst; increased interstitial fluid throughout the r leg. Instructed [name] to send pt home and we will contact him. Any orders Dr Rhea Belton? Thanks.

## 2012-08-14 NOTE — Progress Notes (Signed)
*  PRELIMINARY RESULTS* Vascular Ultrasound Right lower extremity venous duplex has been completed.  Preliminary findings: Right lower extremity: No evidence of DVT or SVT. Positive for Baker's cyst. Interstitial fluid noted throughout. Left CFV: no evidence of DVT.   Lycan Davee FRANCES 08/14/2012, 1:04 PM

## 2012-08-14 NOTE — Telephone Encounter (Signed)
No further instructions, the exam was intended for RLE I think gout is also contributing to symptoms,  Treating it should reduce swelling as well. If he is no better with prednisone, he may need orthopedics referral Interstitial edema secondary to portal hypertension/cirrhosis Other recommendations per clinic note yesterday

## 2012-08-17 NOTE — Telephone Encounter (Signed)
Informed pt he does not have a blood clot and there findings and recommendations per Dr Rhea Belton. Pt will call if leg is no better.

## 2012-08-19 NOTE — Progress Notes (Signed)
Quick Note:  Spoke with pt and notified of results per Dr. Wert. Pt verbalized understanding and denied any questions.  ______ 

## 2012-09-02 ENCOUNTER — Telehealth: Payer: Self-pay | Admitting: Gastroenterology

## 2012-09-02 NOTE — Telephone Encounter (Signed)
Received from via fax from [facility] in touch care for pt. A program to help facilitate coordination of care. Dr. Rhea Belton read it and fells pt could benefit from it.   I spoke to Iline Oven case Production designer, theatre/television/film with [facility] in touch care. Told her I will be sending her her form back along with Dr. Sherald Barge office note. Confirmed fax # [phone]

## 2012-09-04 ENCOUNTER — Ambulatory Visit: Payer: Managed Care, Other (non HMO) | Admitting: Internal Medicine

## 2012-09-08 ENCOUNTER — Ambulatory Visit (INDEPENDENT_AMBULATORY_CARE_PROVIDER_SITE_OTHER)
Admission: RE | Admit: 2012-09-08 | Discharge: 2012-09-08 | Disposition: A | Discharge status: Home / Self Care | Payer: Managed Care, Other (non HMO) | Source: Ambulatory Visit | Attending: Internal Medicine | Admitting: Internal Medicine

## 2012-09-08 ENCOUNTER — Encounter: Payer: Self-pay | Admitting: Internal Medicine

## 2012-09-08 ENCOUNTER — Ambulatory Visit (INDEPENDENT_AMBULATORY_CARE_PROVIDER_SITE_OTHER): Payer: Managed Care, Other (non HMO) | Admitting: Internal Medicine

## 2012-09-08 VITALS — BP 108/62 | HR 112 | Temp 97.7°F | Ht 62.0 in | Wt 139.8 lb

## 2012-09-08 DIAGNOSIS — J90 Pleural effusion, not elsewhere classified: Secondary | ICD-10-CM

## 2012-09-08 NOTE — Progress Notes (Signed)
  Subjective:    Patient ID: Arthur Woodard, male    DOB: 31-Mar-1960   MRN: [id number]   Brief patient profile:  52 yobm never smoker from [place] with Etoh cirrhosis, hbp, dm referred by Dr Rhea Belton to pulmonary clinic 08/07/2012 for eval of R Pleural effusion.   08/07/2012 1st pulmonary eval  Chief Complaint  Patient presents with  . Pulmonary Consult    Referred per Dr Rhea Belton for pleural effusion. The pt c/o having SOB and cough for the past 3 months. Cough is non prod and seems worse at hs.   able to lie down at about 30 degrees comfortably all night with breathing and cough correlating with abd distention. Reports just quit all drinking.  Onset was indolent, pattern was persistent and daily with doe and dry cough but not symptoms sitting at rest.  rec Continue diuresis per renal  09/08/2012 f/u ov/Lakelynn Severtson re R effusion Chief Complaint  Patient presents with  . Follow-up    Breathing back at baseline and cough has resolved. No new co's today.      No obvious daytime variabilty or assoc chronic cough or cp or chest tightness, subjective wheeze overt sinus or hb symptoms. No unusual exp hx or h/o childhood pna/ asthma or knowledge of premature birth.   Sleeping ok without nocturnal  or early am exacerbation  of respiratory  c/o's or need for noct saba. Also denies any obvious fluctuation of symptoms with weather or environmental changes or other aggravating or alleviating factors except as outlined above   Current Medications, Allergies, Past Medical History, Past Surgical History, Family History, and Social History were reviewed in [employer] [facility] record.  ROS  The following are not active complaints unless bolded sore throat, dysphagia, dental problems, itching, sneezing,  nasal congestion or excess/ purulent secretions, ear ache,   fever, chills, sweats, unintended wt loss, pleuritic or exertional cp, hemoptysis,  orthopnea pnd or leg swelling, presyncope, palpitations,  heartburn, abdominal pain, anorexia, nausea, vomiting, diarrhea  or change in bowel or urinary habits, change in stools or urine, dysuria,hematuria,  rash, arthralgias, visual complaints, headache, numbness weakness or ataxia or problems with walking or coordination,  change in mood/affect or memory.                   Objective:   Physical Exam  Pleasant amb bm nad  Wt Readings from Last 3 Encounters:  09/08/12 139 lb 12.8 oz (63.413 kg)  08/13/12 157 lb 9.6 oz (71.487 kg)  08/07/12 169 lb 9.6 oz (76.93 kg)       HEENT: nl dentition, turbinates, and orophanx. Nl external ear canals without cough reflex   NECK :  without JVD/Nodes/TM/ nl carotid upstrokes bilaterally   LUNGS: no acc muscle use, minimal decreased bs with dullness R Base   CV:  RRR  no s3 or murmur or increase in P2, improved bilat lower ext edema   ABD:  soft and min distended with c/w improved  ascites  MS:  warm without deformities, calf tenderness, cyanosis or clubbing  SKIN: warm and dry without lesions    NEURO:  alert, approp, no deficits         CXR  09/08/2012 : 1. Decreased right pleural effusion and basilar airspace disease. 2. Cardiomegaly.       Assessment & Plan:

## 2012-09-08 NOTE — Patient Instructions (Addendum)
Please remember to go to the  x-ray department downstairs for your tests - we will call you with the results when they are available.     Contact this office if it becomes more difficult for you to lie flat or at the angle you are accustomed to.

## 2012-09-09 NOTE — Progress Notes (Signed)
Quick Note:  Spoke with pt and notified of results per Dr. Wert. Pt verbalized understanding and denied any questions.  ______ 

## 2012-09-09 NOTE — Assessment & Plan Note (Signed)
Despite loculations this is clearly improving with diuresis and improvement in ascites.  This would indicate pleural adhesions from prev pna or trauma but not active empyema.  rx is keep dry as tolerates  Pulmonary f/u is prn

## 2012-09-10 ENCOUNTER — Encounter: Payer: Self-pay | Admitting: Internal Medicine

## 2012-09-10 NOTE — Clinical Social Work Psychosocial (Addendum)
Clinical Social Work Department LATE ENTRY BRIEF PSYCHOSOCIAL ASSESSMENT 09/10/2012  Patient:  Arthur Woodard, Arthur Woodard     Account Number:  [id number]     Admit date:  06/30/2012 - D/C Date 07/07/12    Clinical Social Worker:  Delmer Islam  Date/Time:  09/10/2012 08:13 AM  Referred by:  Physician  Date Referred:  07/07/2012 Referred for  Substance Abuse   Other Referral:   Interview type:  Patient Other interview type:    PSYCHOSOCIAL DATA Living Status:  ALONE Admitted from facility:   Level of care:   Primary support name:   Primary support relationship to patient:   Degree of support available:   Patient did not share regarding family/friends    CURRENT CONCERNS Current Concerns  Substance Abuse   Other Concerns:    SOCIAL WORK ASSESSMENT / PLAN CSW talked with patient on 5/20 regarding his current substance abuse and resources offered. Patient expressed interest in going to rehab. He was given information on [facility], [facility] and [facility].   Assessment/plan status:   Other assessment/ plan:   Information/referral to community resources:   [facility], [facility], [facility]    PATIENT'S/FAMILY'S RESPONSE TO PLAN OF CARE: Patient accepted resources and indicated that he would make contact with one of the resources.

## 2012-09-16 ENCOUNTER — Encounter: Payer: Self-pay | Admitting: Internal Medicine

## 2012-09-18 ENCOUNTER — Encounter: Payer: Self-pay | Admitting: Internal Medicine

## 2012-09-18 ENCOUNTER — Ambulatory Visit (INDEPENDENT_AMBULATORY_CARE_PROVIDER_SITE_OTHER): Payer: Managed Care, Other (non HMO) | Admitting: Internal Medicine

## 2012-09-18 VITALS — BP 90/60 | HR 72 | Ht 62.0 in | Wt 134.2 lb

## 2012-09-18 DIAGNOSIS — K7030 Alcoholic cirrhosis of liver without ascites: Secondary | ICD-10-CM

## 2012-09-18 DIAGNOSIS — K769 Liver disease, unspecified: Secondary | ICD-10-CM

## 2012-09-18 DIAGNOSIS — M109 Gout, unspecified: Secondary | ICD-10-CM

## 2012-09-18 DIAGNOSIS — K766 Portal hypertension: Secondary | ICD-10-CM

## 2012-09-18 DIAGNOSIS — K7689 Other specified diseases of liver: Secondary | ICD-10-CM

## 2012-09-18 NOTE — Progress Notes (Signed)
Subjective:    Patient ID: Arthur Woodard, male    DOB: [birth year], 52 y.o.   MRN: [id number]  HPI Mr. Rauch is a 52 yo male with PMH of alcoholic cirrhosis with portal hypertension manifested by ascites, thrombocytopenia, splenomegaly, liver lesions of unclear significance, diabetes, hypertension, H. pylori infection treated, adenomatous colon polyps, and recent acute kidney injury who is seen in follow-up. He was last seen on 08/13/2012. He is feeling considerably better and has lost additional weight as his diuresis continues under the guidance of Dr. Darrick Penna at [facility].  He is feeling well today. He denies abdominal pain. Abdominal girth continues to dramatically decrease. Lower extremity edema has nearly resolved. Gout flare also has resolved and joint pain is significantly better. He completed prednisone taper, and was started on allopurinol recently. He is not drinking alcohol and has no plan to present. He is working hard to avoid sodium in his diet. Appetite has picked up a bit. No nausea or vomiting. Stools have remained normal with no melena or rectal bleeding.  Review of Systems As per history of present illness, otherwise negative  Current Medications, Allergies, Past Medical History, Past Surgical History, Family History and Social History were reviewed in [employer] record.     Objective:   Physical Exam BP 90/60  Pulse 72  Ht 5\' 2"  (1.575 m)  Wt 134 lb 4 oz (60.895 kg)  BMI 24.55 kg/m2 Constitutional: Less ill-appearing today, more upbeat in no acute distress HEENT: Normocephalic and atraumatic. Oropharynx is clear and moist. No oropharyngeal exudate. Conjunctivae are normal.  Mild icteric sclera Cardiovascular: Normal rate, regular rhythm and intact distal pulses.  Pulmonary/chest: Normal effort decreased right base Abdominal: Soft, nontender, nondistended. Bowel sounds active throughout. No appreciable ascites today Extremities: no  clubbing, cyanosis, or edema Lymphadenopathy: No cervical adenopathy noted. Neurological: Alert and oriented to person place and time. No asterixis Skin: Skin is warm and dry. No rashes noted. Psychiatric: Normal mood and affect. Behavior is normal.  Labs -- pending today    Assessment & Plan:  52 yo male with PMH of alcoholic cirrhosis with portal hypertension manifested by ascites, thrombocytopenia, splenomegaly, liver lesions of unclear significance, diabetes, hypertension, H. pylori infection treated, adenomatous colon polyps, and recent acute kidney injury who is seen in follow-up   1. Alcoholic Cirrhosis/portal HTN/ascites/thrombocytopenia -- he has steadily improved now that he has been alcohol abstinent. He is responding very well to diuresis and I greatly appreciate Dr. Deterding's help and assistance with diuresis and renal function. I did receive a note from Dr. Darrick Penna asking that we avoid large volume paracentesis due to renal function, which I understand completely. Luckily he doesn't need one any way.  I have again stressed the importance of complete alcohol avoidance. He seems more willing to avoid it permanently today, likely as he is now realizing his own improvement off alcohol. --Continue low-sodium diet  --Alcohol avoidance  --Variceal screening -- discuss EGD at next visit --Ascites -- dramatically improving, diuretic management per Dr.Deterding   2. Liver lesions/HCC screening -- stable in size it recent MRI. They are felt to most likely be dysplastic. There are 4 lesions in total at this point. He will have surveillance MRI 6 months after his last to determine stability.  3. Right pleural effusion -- improving by chest x-ray.  Felt to be improving along with ascites.  He was seen by Dr. Sherene Sires and can be seen again if needed.  4.  Gout -- resolved,  now on allopurinol  Return in about 2 months, sooner if needed

## 2012-09-18 NOTE — Patient Instructions (Addendum)
Continue on current medications   Abstain from Alcohol and continue on a low sodium diet.   Follow up with Dr. Rhea Belton in office in 2-3 months                                               We are excited to introduce MyChart, a new best-in-class service that provides you online access to important information in your electronic medical record. We want to make it easier for you to view your health information - all in one secure location - when and where you need it. We expect MyChart will enhance the quality of care and service we provide.  When you register for MyChart, you can:    View your test results.    Request appointments and receive appointment reminders via email.    Request medication renewals.    View your medical history, allergies, medications and immunizations.    Communicate with your physician's office through a password-protected site.    Conveniently print information such as your medication lists.  To find out if MyChart is right for you, please talk to a member of our clinical staff today. We will gladly answer your questions about this free health and wellness tool.  If you are age 51 or older and want a member of your family to have access to your record, you must provide written consent by completing a proxy form available at our office. Please speak to our clinical staff about guidelines regarding accounts for patients younger than age 54.  As you activate your MyChart account and need any technical assistance, please call the MyChart technical support line at [phone]) or email your question to [email or web address]. If you email your question(s), please include your name, a return phone number and the best time to reach you.  If you have non-urgent health-related questions, you can send a message to our office through MyChart at [email or web address]. If you have a medical emergency, call 911.  Thank you for using MyChart as your new  health and wellness resource!   MyChart licensed from [employer],  [id number]. Patents Pending.

## 2012-09-21 ENCOUNTER — Telehealth: Payer: Self-pay | Admitting: Internal Medicine

## 2012-09-21 MED ORDER — TRAZODONE HCL 50 MG PO TABS: 50.0000 mg | ORAL_TABLET | Freq: Every day | ORAL | Status: DC

## 2012-09-21 NOTE — Telephone Encounter (Signed)
Trazodone 50 mg each bedtime as needed for sleep. Call if ineffective or problems

## 2012-09-21 NOTE — Telephone Encounter (Signed)
Informed pt of med order and he is to call for problems or if ineffective; pt stated understanding.

## 2012-09-21 NOTE — Telephone Encounter (Signed)
Dr Rhea Belton, were you or can you order something for sleep for pt? Thanks.

## 2012-11-12 ENCOUNTER — Other Ambulatory Visit: Payer: Self-pay | Admitting: Internal Medicine

## 2013-01-21 ENCOUNTER — Ambulatory Visit (HOSPITAL_COMMUNITY)
Admission: RE | Admit: 2013-01-21 | Discharge: 2013-01-21 | Disposition: A | Discharge status: Home / Self Care | Payer: Managed Care, Other (non HMO) | Source: Ambulatory Visit | Attending: Internal Medicine | Admitting: Internal Medicine

## 2013-01-21 DIAGNOSIS — K7689 Other specified diseases of liver: Secondary | ICD-10-CM | POA: Insufficient documentation

## 2013-01-21 DIAGNOSIS — K7460 Unspecified cirrhosis of liver: Secondary | ICD-10-CM | POA: Insufficient documentation

## 2013-01-21 DIAGNOSIS — R935 Abnormal findings on diagnostic imaging of other abdominal regions, including retroperitoneum: Secondary | ICD-10-CM | POA: Insufficient documentation

## 2013-01-21 DIAGNOSIS — K766 Portal hypertension: Secondary | ICD-10-CM | POA: Insufficient documentation

## 2013-01-21 DIAGNOSIS — R933 Abnormal findings on diagnostic imaging of other parts of digestive tract: Secondary | ICD-10-CM

## 2013-01-21 LAB — POCT I-STAT CREATININE: Creatinine, Ser: 1.2 mg/dL (ref 0.50–1.35)

## 2013-01-21 MED ORDER — GADOBENATE DIMEGLUMINE 529 MG/ML IV SOLN
15.0000 mL | Freq: Once | INTRAVENOUS | Status: AC | PRN
  Administered 2013-01-21: 13 mL via INTRAVENOUS

## 2013-01-27 ENCOUNTER — Encounter: Payer: Self-pay | Admitting: Internal Medicine

## 2013-01-28 ENCOUNTER — Other Ambulatory Visit (INDEPENDENT_AMBULATORY_CARE_PROVIDER_SITE_OTHER): Payer: Managed Care, Other (non HMO)

## 2013-01-28 ENCOUNTER — Encounter: Payer: Self-pay | Admitting: Internal Medicine

## 2013-01-28 ENCOUNTER — Ambulatory Visit (INDEPENDENT_AMBULATORY_CARE_PROVIDER_SITE_OTHER): Payer: Managed Care, Other (non HMO) | Admitting: Internal Medicine

## 2013-01-28 VITALS — BP 124/70 | HR 70 | Ht 62.0 in | Wt 140.2 lb

## 2013-01-28 DIAGNOSIS — K7030 Alcoholic cirrhosis of liver without ascites: Secondary | ICD-10-CM

## 2013-01-28 DIAGNOSIS — K7689 Other specified diseases of liver: Secondary | ICD-10-CM

## 2013-01-28 DIAGNOSIS — F1011 Alcohol abuse, in remission: Secondary | ICD-10-CM

## 2013-01-28 LAB — CBC
MCHC: 34.2 g/dL (ref 30.0–36.0)
MCV: 99.7 fl (ref 78.0–100.0)
Platelets: 76 10*3/uL — ABNORMAL LOW (ref 150.0–400.0)
RDW: 15.6 % — ABNORMAL HIGH (ref 11.5–14.6)
WBC: 4.4 10*3/uL — ABNORMAL LOW (ref 4.5–10.5)

## 2013-01-28 LAB — COMPREHENSIVE METABOLIC PANEL
ALT: 65 U/L — ABNORMAL HIGH (ref 0–53)
AST: 80 U/L — ABNORMAL HIGH (ref 0–37)
Albumin: 3.9 g/dL (ref 3.5–5.2)
BUN: 30 mg/dL — ABNORMAL HIGH (ref 6–23)
CO2: 29 mEq/L (ref 19–32)
Calcium: 9.6 mg/dL (ref 8.4–10.5)
Chloride: 98 mEq/L (ref 96–112)
GFR: 85.97 mL/min (ref >60.00)
Potassium: 4.7 mEq/L (ref 3.5–5.1)
Total Protein: 9.1 g/dL — ABNORMAL HIGH (ref 6.0–8.3)

## 2013-01-28 LAB — PROTIME-INR: Prothrombin Time: 13.9 s — ABNORMAL HIGH (ref 10.2–12.4)

## 2013-01-28 NOTE — Patient Instructions (Signed)
Your physician has requested that you go to the basement for the following lab work before leaving today: cbc, cmp, inr  No alcohol  Follow up in office in 6 months  Ok to return to work                                               We are excited to [employer], a new best-in-class service that provides you online access to important information in your electronic medical record. We want to make it easier for you to view your health information - all in one secure location - when and where you need it. We expect MyChart will enhance the quality of care and service we provide.  When you register for MyChart, you can:    View your test results.    Request appointments and receive appointment reminders via email.    Request medication renewals.    View your medical history, allergies, medications and immunizations.    Communicate with your physician's office through a password-protected site.    Conveniently print information such as your medication lists.  To find out if MyChart is right for you, please talk to a member of our clinical staff today. We will gladly answer your questions about this free health and wellness tool.  If you are age 55 or older and want a member of your family to have access to your record, you must provide written consent by completing a proxy form available at our office. Please speak to our clinical staff about guidelines regarding accounts for patients younger than age 81.  As you activate your MyChart account and need any technical assistance, please call the MyChart technical support line at [phone]) or email your question to [email or web address]. If you email your question(s), please include your name, a return phone number and the best time to reach you.  If you have non-urgent health-related questions, you can send a message to our office through MyChart at [email or web address]. If you have a medical emergency, call  911.  Thank you for using MyChart as your new health and wellness resource!   MyChart licensed from [employer],  [id number]. Patents Pending.

## 2013-01-28 NOTE — Progress Notes (Signed)
Subjective:    Patient ID: Arthur Woodard, male    DOB: 22-Oct-1960, 52 y.o.   MRN: [id number]  HPI Arthur Woodard is a 51 yo male with PMH of alcoholic cirrhosis with portal hypertension manifested by ascites, thrombocytopenia, splenomegaly, liver lesions of unclear significance, diabetes, hypertension, H. pylori infection treated, adenomatous colon polyps, and recent acute kidney injury who is seen in follow-up. He was last seen in August 2014. He is here today with his wife. He recently had a surveillance MRI scan of the abdomen and pelvis. He reports he continued to feel well. His abdominal distention has resolved. He denies lower extremity edema. No jaundice, confusion. He admits to drinking alcohol very rarely maybe one beer every 2-3 weeks. I pressed him on this and he reports only one drink on those occasions. Wife endorses this statement. No abdominal pain. No nausea or vomiting. Normal bowel movements with no blood in his stool or melena. Appetite is good he reports. Sleep has improved with trazodone 50 mg at bedtime.  We had helped fill out disability paperwork which he reports was denied  Review of Systems As per history of present illness, otherwise negative  Current Medications, Allergies, Past Medical History, Past Surgical History, Family History and Social History were reviewed in [employer] record.     Objective:   Physical Exam BP 124/70  Pulse 70  Ht 5\' 2"  (1.575 m)  Wt 140 lb 3.2 oz (63.594 kg)  BMI 25.64 kg/m2 Constitutional: Well-developed and well-nourished. No distress. HEENT: Normocephalic and atraumatic. Oropharynx is clear and moist. No oropharyngeal exudate. Conjunctivae are normal.  No scleral icterus. Neck: Neck supple. Trachea midline. Cardiovascular: Normal rate, regular rhythm and intact distal pulses. Pulmonary/chest: Effort normal and breath sounds normal. No wheezing, rales or rhonchi. Abdominal: Soft, nontender, nondistended.  Bowel sounds active throughout.  Extremities: no clubbing, cyanosis, or edema Neurological: Alert and oriented to person place and time. Skin: Skin is warm and dry. No rashes noted. Psychiatric: Normal mood and affect. Behavior is normal.  CBC, CMP, INR today  MRI ABDOMEN WITHOUT AND WITH CONTRAST -- 01/28/2013   TECHNIQUE: Multiplanar multisequence MR imaging of the abdomen was performed both before and after the administration of intravenous contrast.   CONTRAST:  13mL MULTIHANCE GADOBENATE DIMEGLUMINE 529 MG/ML IV SOLN   COMPARISON:  07/30/2012   FINDINGS: As demonstrated on the prior MRI there are cirrhotic changes involving the liver. The ascites is no longer present. There are multiple early arterial phase enhancing liver lesions. The measurable lesions are stable in size when compared to the prior examination. There are smaller lesions which are evident on this exam and not well demonstrated on the prior exam, some of which may be due to the prior exam being motion degraded. Two small sub 5 mm nodules are noted on series 1101, image number 13 in the dome of the liver. A sub 5 mm nodule is also noted on image number 23 in the left hepatic lobe. An enhancing nodular density is noted adjacent to the IVC on image number 30. A sub 5 mm nodule suspected on image number 53 in the inferior aspect of the right lobe.   Stable changes of portal venous hypertension with portal venous collaterals. No splenomegaly. The pancreas is unremarkable. The adrenal glands and kidneys are unremarkable and stable. Small scattered mesenteric and retroperitoneal lymph nodes but no mass or adenopathy. The gallbladder is grossly normal. No common bile duct dilatation. The aorta and branch vessels  are patent.   IMPRESSION: 1. Stable cirrhotic changes involving the liver but the ascites has resolved. Stable portal venous hypertension with portal venous collaterals but no splenomegaly. 2. Stable  early arterial phase enhancing liver lesions most consistent with dysplastic nodules. There are also multiple sub 5 mm nodules which may be new or just better demonstrated on this examination. Recommend continued surveillance. 3. No abdominal adenopathy.      Assessment & Plan:  52 yo male with PMH of alcoholic cirrhosis with portal hypertension manifested by ascites, thrombocytopenia, splenomegaly, liver lesions of unclear significance, diabetes, hypertension, H. pylori infection treated, adenomatous colon polyps, and recent acute kidney injury who is seen in follow-up.  1. Cirrhosis/ETOH abuse -- he has continued to do well and his ascites has resolved. We have discussed this today and I have encouraged him to try to not drink alcohol at all. He has drastically reduced alcohol, which I have congratulated him on, and likely has led to improvement in his portal hypertension. We are closely following the liver lesions, see #2. --Continue low-sodium diet --Alcohol abstinence --Variceal screening 03/2011 -- no varices --Ascites, resolved.  He is on Lasix 80 mg daily under the direction of his nephrologist. I will defer any diuretic changes/management to nephrology  2.  Liver lesions -- stable by recent MRI. Possibly dysplastic and we have discussed how I cannot exclude HCC. They do not appear to be growing rapidly which is a good sign.  We will plan repeat MRI with contrast to reassess these lesions in 12 months.  3.  Work -- he has improved and asked about returning to work. I do think it is okay for him to return to work at this time.  Return in 6 months, sooner if needed

## 2013-10-21 ENCOUNTER — Telehealth: Payer: Self-pay | Admitting: *Deleted

## 2013-10-21 NOTE — Telephone Encounter (Signed)
Dr Hilarie Fredrickson recently received a fax from [facility] detailing patient's office visit with them on 10/11/13. Dr Hilarie Fredrickson has asked that patient be scheduled for routine gi follow up office visit. Patient has been scheduled for follow up with Dr Hilarie Fredrickson on his next available opening, 01/03/14 @ 9:00 am. Patient is advised of this and verbalizes understanding.

## 2013-12-31 ENCOUNTER — Encounter: Payer: Self-pay | Admitting: *Deleted

## 2014-01-03 ENCOUNTER — Encounter: Payer: Self-pay | Admitting: Internal Medicine

## 2014-01-03 ENCOUNTER — Other Ambulatory Visit (INDEPENDENT_AMBULATORY_CARE_PROVIDER_SITE_OTHER): Payer: BC Managed Care – PPO

## 2014-01-03 ENCOUNTER — Ambulatory Visit (INDEPENDENT_AMBULATORY_CARE_PROVIDER_SITE_OTHER): Payer: BC Managed Care – PPO | Admitting: Internal Medicine

## 2014-01-03 VITALS — BP 122/80 | HR 68 | Ht 62.0 in | Wt 139.0 lb

## 2014-01-03 DIAGNOSIS — K769 Liver disease, unspecified: Secondary | ICD-10-CM

## 2014-01-03 DIAGNOSIS — K7030 Alcoholic cirrhosis of liver without ascites: Secondary | ICD-10-CM

## 2014-01-03 DIAGNOSIS — K7689 Other specified diseases of liver: Secondary | ICD-10-CM

## 2014-01-03 DIAGNOSIS — Z23 Encounter for immunization: Secondary | ICD-10-CM

## 2014-01-03 DIAGNOSIS — F1010 Alcohol abuse, uncomplicated: Secondary | ICD-10-CM

## 2014-01-03 DIAGNOSIS — F1011 Alcohol abuse, in remission: Secondary | ICD-10-CM

## 2014-01-03 LAB — COMPREHENSIVE METABOLIC PANEL
ALBUMIN: 4 g/dL (ref 3.5–5.2)
ALT: 93 U/L — ABNORMAL HIGH (ref 0–53)
AST: 167 U/L — AB (ref 0–37)
Alkaline Phosphatase: 155 U/L — ABNORMAL HIGH (ref 39–117)
BUN: 25 mg/dL — AB (ref 6–23)
CALCIUM: 9.2 mg/dL (ref 8.4–10.5)
CHLORIDE: 100 meq/L (ref 96–112)
CO2: 24 mEq/L (ref 19–32)
Creatinine, Ser: 1.1 mg/dL (ref 0.4–1.5)
GFR: 91.12 mL/min (ref >60.00)
GLUCOSE: 136 mg/dL — AB (ref 70–99)
Potassium: 4.7 mEq/L (ref 3.5–5.1)
SODIUM: 138 meq/L (ref 135–145)
Total Bilirubin: 1.7 mg/dL — ABNORMAL HIGH (ref 0.2–1.2)
Total Protein: 8.2 g/dL (ref 6.0–8.3)

## 2014-01-03 LAB — CBC WITH DIFFERENTIAL/PLATELET
BASOS PCT: 0.6 % (ref 0.0–3.0)
Basophils Absolute: 0 10*3/uL (ref 0.0–0.1)
Eosinophils Absolute: 0.2 10*3/uL (ref 0.0–0.7)
Eosinophils Relative: 5.6 % — ABNORMAL HIGH (ref 0.0–5.0)
HCT: 36.1 % — ABNORMAL LOW (ref 39.0–52.0)
Hemoglobin: 12 g/dL — ABNORMAL LOW (ref 13.0–17.0)
LYMPHS PCT: 31.2 % (ref 12.0–46.0)
Lymphs Abs: 1.1 10*3/uL (ref 0.7–4.0)
MCHC: 33.3 g/dL (ref 30.0–36.0)
MCV: 104.4 fl — ABNORMAL HIGH (ref 78.0–100.0)
Monocytes Absolute: 0.5 10*3/uL (ref 0.1–1.0)
Monocytes Relative: 15.6 % — ABNORMAL HIGH (ref 3.0–12.0)
Neutro Abs: 1.6 10*3/uL (ref 1.4–7.7)
Neutrophils Relative %: 47 % (ref 43.0–77.0)
PLATELETS: 82 10*3/uL — AB (ref 150.0–400.0)
RBC: 3.46 Mil/uL — AB (ref 4.22–5.81)
RDW: 15.2 % (ref 11.5–15.5)
WBC: 3.5 10*3/uL — ABNORMAL LOW (ref 4.0–10.5)

## 2014-01-03 LAB — PROTIME-INR
INR: 1.2 ratio — AB (ref 0.8–1.0)
PROTHROMBIN TIME: 13.2 s — AB (ref 9.6–13.1)

## 2014-01-03 NOTE — Progress Notes (Signed)
Subjective:    Patient ID: Arthur Woodard, male    DOB: May 30, 1960, 53 y.o.   MRN: [id number]  HPI Arthur Woodard is a 53 yo male with PMH of alcoholic cirrhosis with portal hypertension previously with ascites, thrombocytopenia, splenomegaly and liver lesions of on clear significance who is seen in follow-up. He also has diabetes, hypertension and history of H. Pylori which was successfully treated and adenomatous colon polyp. He was last seen one year ago. He reports he is feeling well. He has had not had issues with abdominal distention or lower extremity edema. He does take Lasix 40 mg daily. His kidney function has improved and he is following annually with his nephrologist. He reports good appetite without nausea or vomiting. No itching or jaundice. No trouble swallowing. Normal bowel movements without blood in his stool or melena. His sister passed away in [place] earlier this year. For several months he did use alcohol though he denies excessively or frequently. His sister was buried in August and since then he has avoided alcohol.  He requests flu vaccine today  Review of Systems As per history of present illness, otherwise negative  Current Medications, Allergies, Past Medical History, Past Surgical History, Family History and Social History were reviewed in [employer] record.     Objective:   Physical Exam BP 122/80 mmHg  Pulse 68  Ht 5\' 2"  (1.575 m)  Wt 139 lb (63.05 kg)  BMI 25.42 kg/m2 Constitutional: Well-developed and well-nourished. No distress. HEENT: Normocephalic and atraumatic. Oropharynx is clear and moist. No oropharyngeal exudate. Conjunctivae are normal.  No scleral icterus. Neck: Neck supple. Trachea midline. Cardiovascular: Normal rate, regular rhythm and intact distal pulses. No M/R/G Pulmonary/chest: Effort normal and breath sounds normal. No wheezing, rales or rhonchi. Abdominal: Soft, nontender, nondistended. Bowel sounds active  throughout.  Extremities: no clubbing, cyanosis, or edema Lymphadenopathy: No cervical adenopathy noted. Neurological: Alert and oriented to person place and time.no asterixisSkin: Skin is warm and dry. No rashes noted. Psychiatric: Normal mood and affect. Behavior is normal.  MRI ABDOMEN WITHOUT AND WITH CONTRAST -- Dec 2014   TECHNIQUE: Multiplanar multisequence MR imaging of the abdomen was performed both before and after the administration of intravenous contrast.   CONTRAST:  54mL MULTIHANCE GADOBENATE DIMEGLUMINE 529 MG/ML IV SOLN   COMPARISON:  07/30/2012   FINDINGS: As demonstrated on the prior MRI there are cirrhotic changes involving the liver. The ascites is no longer present. There are multiple early arterial phase enhancing liver lesions. The measurable lesions are stable in size when compared to the prior examination. There are smaller lesions which are evident on this exam and not well demonstrated on the prior exam, some of which may be due to the prior exam being motion degraded. Two small sub 5 mm nodules are noted on series 1101, image number 13 in the dome of the liver. A sub 5 mm nodule is also noted on image number 23 in the left hepatic lobe. An enhancing nodular density is noted adjacent to the IVC on image number 30. A sub 5 mm nodule suspected on image number 53 in the inferior aspect of the right lobe.   Stable changes of portal venous hypertension with portal venous collaterals. No splenomegaly. The pancreas is unremarkable. The adrenal glands and kidneys are unremarkable and stable. Small scattered mesenteric and retroperitoneal lymph nodes but no mass or adenopathy. The gallbladder is grossly normal. No common bile duct dilatation. The aorta and branch vessels are patent.  IMPRESSION: 1. Stable cirrhotic changes involving the liver but the ascites has resolved. Stable portal venous hypertension with portal venous collaterals but no  splenomegaly. 2. Stable early arterial phase enhancing liver lesions most consistent with dysplastic nodules. There are also multiple sub 5 mm nodules which may be new or just better demonstrated on this examination. Recommend continued surveillance. 3. No abdominal adenopathy.     Electronically Signed   By: Kalman Jewels M.D.   On: 01/21/2013 10:51  Lab Results  Component Value Date   INR 1.3* 01/28/2013   INR 3.8* 07/20/2012   INR 3.16* 07/05/2012       Assessment & Plan:  53 yo male with PMH of alcoholic cirrhosis with portal hypertension previously with ascites, thrombocytopenia, splenomegaly and liver lesions of on clear significance who is seen in follow-up.  1. Alcoholic cirrhosis -- his liver disease appears to be well compensated at this time. He has dramatically reduced alcohol intake and I have recommended that he continue to avoid alcohol strictly. His portal hypertension has improved with decreased alcohol intake. No evidence of ascites today. No history of encephalopathy. He denies bleeding. --Flu vaccine today --Continue low-sodium diet. --Variceal screening last in February 2013 without varices, needs to be repeated in 2016, discussed follow-up --Resolved ascites --Alcohol abstinence strongly recommended --CBC, CMP and INR  2. Liver lesions -- most recently stable and likely dysplastic nodules. Repeating MRI with contrast at this time. He relies and he is at risk for [facility] with cirrhosis.  Follow-up in 6 months

## 2014-01-03 NOTE — Patient Instructions (Addendum)
Your physician has requested that you go to the basement for the following lab work before leaving today: CBC, CMP, INR  We have given you a flu vaccine today.  Please make sure to continue avoiding all alcohol.  You have been scheduled for an MRI of the liver with contrast at [facility] on 01/14/14. Your appointment time is 9:00 am. Please arrive 15 minutes prior to your appointment time for registration purposes. Please make certain not to have anything to eat or drink 6 hours prior to your test. In addition, if you have any metal in your body, have a pacemaker or defibrillator, please be sure to let your ordering physician know. This test typically takes 45 minutes to 1 hour to complete.  CC:Dr Kpeglo

## 2014-01-04 ENCOUNTER — Other Ambulatory Visit (INDEPENDENT_AMBULATORY_CARE_PROVIDER_SITE_OTHER): Payer: BC Managed Care – PPO

## 2014-01-04 DIAGNOSIS — D509 Iron deficiency anemia, unspecified: Secondary | ICD-10-CM

## 2014-01-04 LAB — VITAMIN B12: Vitamin B-12: >1500 pg/mL — ABNORMAL HIGH (ref 211–911)

## 2014-01-04 LAB — FOLATE: Folate: 17.9 ng/mL (ref >5.9)

## 2014-01-14 ENCOUNTER — Ambulatory Visit (HOSPITAL_COMMUNITY)
Admission: RE | Admit: 2014-01-14 | Discharge: 2014-01-14 | Disposition: A | Discharge status: Home / Self Care | Payer: BC Managed Care – PPO | Source: Ambulatory Visit | Attending: Internal Medicine | Admitting: Internal Medicine

## 2014-01-14 ENCOUNTER — Other Ambulatory Visit: Payer: Self-pay | Admitting: Internal Medicine

## 2014-01-14 DIAGNOSIS — K769 Liver disease, unspecified: Secondary | ICD-10-CM

## 2014-01-14 DIAGNOSIS — K760 Fatty (change of) liver, not elsewhere classified: Secondary | ICD-10-CM | POA: Diagnosis not present

## 2014-01-14 DIAGNOSIS — K7460 Unspecified cirrhosis of liver: Secondary | ICD-10-CM | POA: Insufficient documentation

## 2014-01-14 DIAGNOSIS — K7030 Alcoholic cirrhosis of liver without ascites: Secondary | ICD-10-CM

## 2014-01-14 DIAGNOSIS — I8500 Esophageal varices without bleeding: Secondary | ICD-10-CM | POA: Diagnosis not present

## 2014-01-14 MED ORDER — GADOBENATE DIMEGLUMINE 529 MG/ML IV SOLN
12.0000 mL | Freq: Once | INTRAVENOUS | Status: AC | PRN
  Administered 2014-01-14: 12 mL via INTRAVENOUS

## 2014-01-24 ENCOUNTER — Telehealth: Payer: Self-pay

## 2014-01-24 NOTE — Telephone Encounter (Signed)
Called and left message for pt to call back.  After multiple attempts have been unable to reach pt. Results letter mailed to pt.

## 2014-06-27 ENCOUNTER — Encounter: Payer: Self-pay | Admitting: Internal Medicine

## 2014-08-04 ENCOUNTER — Encounter (HOSPITAL_COMMUNITY): Payer: Self-pay | Admitting: Emergency Medicine

## 2014-08-04 ENCOUNTER — Emergency Department (HOSPITAL_COMMUNITY): Payer: 59

## 2014-08-04 ENCOUNTER — Inpatient Hospital Stay (HOSPITAL_COMMUNITY)
Admission: EM | Admit: 2014-08-04 | Discharge: 2014-08-06 | DRG: 641 | Disposition: A | Discharge status: Home / Self Care | Payer: 59 | Attending: Pulmonary Disease | Admitting: Pulmonary Disease

## 2014-08-04 DIAGNOSIS — E875 Hyperkalemia: Secondary | ICD-10-CM | POA: Diagnosis present

## 2014-08-04 DIAGNOSIS — D649 Anemia, unspecified: Secondary | ICD-10-CM | POA: Diagnosis present

## 2014-08-04 DIAGNOSIS — E119 Type 2 diabetes mellitus without complications: Secondary | ICD-10-CM

## 2014-08-04 DIAGNOSIS — E876 Hypokalemia: Secondary | ICD-10-CM | POA: Diagnosis not present

## 2014-08-04 DIAGNOSIS — R531 Weakness: Secondary | ICD-10-CM

## 2014-08-04 DIAGNOSIS — K7200 Acute and subacute hepatic failure without coma: Secondary | ICD-10-CM | POA: Diagnosis not present

## 2014-08-04 DIAGNOSIS — K7031 Alcoholic cirrhosis of liver with ascites: Secondary | ICD-10-CM

## 2014-08-04 DIAGNOSIS — R0682 Tachypnea, not elsewhere classified: Secondary | ICD-10-CM | POA: Diagnosis present

## 2014-08-04 DIAGNOSIS — N189 Chronic kidney disease, unspecified: Secondary | ICD-10-CM | POA: Diagnosis present

## 2014-08-04 DIAGNOSIS — N179 Acute kidney failure, unspecified: Secondary | ICD-10-CM

## 2014-08-04 DIAGNOSIS — E872 Acidosis, unspecified: Secondary | ICD-10-CM | POA: Diagnosis present

## 2014-08-04 DIAGNOSIS — Z23 Encounter for immunization: Secondary | ICD-10-CM

## 2014-08-04 DIAGNOSIS — E8729 Other acidosis: Secondary | ICD-10-CM

## 2014-08-04 DIAGNOSIS — F1010 Alcohol abuse, uncomplicated: Secondary | ICD-10-CM | POA: Diagnosis present

## 2014-08-04 DIAGNOSIS — I129 Hypertensive chronic kidney disease with stage 1 through stage 4 chronic kidney disease, or unspecified chronic kidney disease: Secondary | ICD-10-CM | POA: Diagnosis present

## 2014-08-04 DIAGNOSIS — K766 Portal hypertension: Secondary | ICD-10-CM | POA: Diagnosis present

## 2014-08-04 DIAGNOSIS — E11649 Type 2 diabetes mellitus with hypoglycemia without coma: Secondary | ICD-10-CM | POA: Diagnosis present

## 2014-08-04 DIAGNOSIS — K7030 Alcoholic cirrhosis of liver without ascites: Secondary | ICD-10-CM | POA: Diagnosis present

## 2014-08-04 DIAGNOSIS — D696 Thrombocytopenia, unspecified: Secondary | ICD-10-CM | POA: Diagnosis present

## 2014-08-04 DIAGNOSIS — T383X5A Adverse effect of insulin and oral hypoglycemic [antidiabetic] drugs, initial encounter: Secondary | ICD-10-CM | POA: Diagnosis present

## 2014-08-04 DIAGNOSIS — K7460 Unspecified cirrhosis of liver: Secondary | ICD-10-CM | POA: Diagnosis not present

## 2014-08-04 LAB — CBC WITH DIFFERENTIAL/PLATELET
Basophils Absolute: 0 10*3/uL (ref 0.0–0.1)
Basophils Relative: 0 % (ref 0–1)
Eosinophils Absolute: 0 10*3/uL (ref 0.0–0.7)
Eosinophils Relative: 0 % (ref 0–5)
HCT: 33.2 % — ABNORMAL LOW (ref 39.0–52.0)
Hemoglobin: 10.8 g/dL — ABNORMAL LOW (ref 13.0–17.0)
LYMPHS ABS: 1.1 10*3/uL (ref 0.7–4.0)
Lymphocytes Relative: 10 % — ABNORMAL LOW (ref 12–46)
MCH: 36.4 pg — AB (ref 26.0–34.0)
MCHC: 32.5 g/dL (ref 30.0–36.0)
MCV: 111.8 fL — ABNORMAL HIGH (ref 78.0–100.0)
MONO ABS: 1.9 10*3/uL — AB (ref 0.1–1.0)
MONOS PCT: 18 % — AB (ref 3–12)
Neutro Abs: 7.8 10*3/uL — ABNORMAL HIGH (ref 1.7–7.7)
Neutrophils Relative %: 72 % (ref 43–77)
PLATELETS: 122 10*3/uL — AB (ref 150–400)
RBC: 2.97 MIL/uL — AB (ref 4.22–5.81)
RDW: 14.2 % (ref 11.5–15.5)
WBC: 10.8 10*3/uL — AB (ref 4.0–10.5)

## 2014-08-04 LAB — CBC
HCT: 28.9 % — ABNORMAL LOW (ref 39.0–52.0)
HEMOGLOBIN: 9.8 g/dL — AB (ref 13.0–17.0)
MCH: 36.7 pg — ABNORMAL HIGH (ref 26.0–34.0)
MCHC: 33.9 g/dL (ref 30.0–36.0)
MCV: 108.2 fL — AB (ref 78.0–100.0)
Platelets: 102 10*3/uL — ABNORMAL LOW (ref 150–400)
RBC: 2.67 MIL/uL — AB (ref 4.22–5.81)
RDW: 13.7 % (ref 11.5–15.5)
WBC: 10.3 10*3/uL (ref 4.0–10.5)

## 2014-08-04 LAB — BASIC METABOLIC PANEL
ANION GAP: 28 — AB (ref 5–15)
ANION GAP: 18 — AB (ref 5–15)
BUN: 45 mg/dL — ABNORMAL HIGH (ref 6–20)
BUN: 48 mg/dL — ABNORMAL HIGH (ref 6–20)
CALCIUM: 7.6 mg/dL — AB (ref 8.9–10.3)
CO2: 10 mmol/L — ABNORMAL LOW (ref 22–32)
CO2: 20 mmol/L — ABNORMAL LOW (ref 22–32)
CREATININE: 2.84 mg/dL — AB (ref 0.61–1.24)
Calcium: 7.8 mg/dL — ABNORMAL LOW (ref 8.9–10.3)
Chloride: 96 mmol/L — ABNORMAL LOW (ref 101–111)
Chloride: 98 mmol/L — ABNORMAL LOW (ref 101–111)
Creatinine, Ser: 2.66 mg/dL — ABNORMAL HIGH (ref 0.61–1.24)
GFR calc non Af Amer: 26 mL/min — ABNORMAL LOW (ref >60)
GFR calc non Af Amer: 24 mL/min — ABNORMAL LOW (ref >60)
GFR, EST AFRICAN AMERICAN: 30 mL/min — AB (ref >60)
GFR, EST AFRICAN AMERICAN: 28 mL/min — AB (ref >60)
Glucose, Bld: 284 mg/dL — ABNORMAL HIGH (ref 65–99)
Glucose, Bld: 308 mg/dL — ABNORMAL HIGH (ref 65–99)
Potassium: 5.9 mmol/L — ABNORMAL HIGH (ref 3.5–5.1)
Potassium: 4.2 mmol/L (ref 3.5–5.1)
Sodium: 134 mmol/L — ABNORMAL LOW (ref 135–145)
Sodium: 136 mmol/L (ref 135–145)

## 2014-08-04 LAB — URINALYSIS, ROUTINE W REFLEX MICROSCOPIC
BILIRUBIN URINE: NEGATIVE
GLUCOSE, UA: 250 mg/dL — AB
KETONES UR: 15 mg/dL — AB
Leukocytes, UA: NEGATIVE
Nitrite: NEGATIVE
PH: 5 (ref 5.0–8.0)
Protein, ur: 30 mg/dL — AB
Specific Gravity, Urine: 1.011 (ref 1.005–1.030)
Urobilinogen, UA: 1 mg/dL (ref 0.0–1.0)

## 2014-08-04 LAB — OSMOLALITY: OSMOLALITY: 320 mosm/kg — AB (ref 275–300)

## 2014-08-04 LAB — GLUCOSE, CAPILLARY
Glucose-Capillary: 273 mg/dL — ABNORMAL HIGH (ref 65–99)
Glucose-Capillary: 347 mg/dL — ABNORMAL HIGH (ref 65–99)
Glucose-Capillary: 296 mg/dL — ABNORMAL HIGH (ref 65–99)
Glucose-Capillary: 287 mg/dL — ABNORMAL HIGH (ref 65–99)
Glucose-Capillary: 187 mg/dL — ABNORMAL HIGH (ref 65–99)
Glucose-Capillary: 102 mg/dL — ABNORMAL HIGH (ref 65–99)

## 2014-08-04 LAB — I-STAT ARTERIAL BLOOD GAS, ED
ACID-BASE DEFICIT: 25 mmol/L — AB (ref 0.0–2.0)
Bicarbonate: 5 mEq/L — ABNORMAL LOW (ref 20.0–24.0)
O2 SAT: 95 %
PO2 ART: 109 mmHg — AB (ref 80.0–100.0)
TCO2: 6 mmol/L (ref 0–100)
pCO2 arterial: 20.9 mmHg — ABNORMAL LOW (ref 35.0–45.0)
pH, Arterial: 6.986 — CL (ref 7.350–7.450)

## 2014-08-04 LAB — COMPREHENSIVE METABOLIC PANEL
ALBUMIN: 3.6 g/dL (ref 3.5–5.0)
ALT: 124 U/L — ABNORMAL HIGH (ref 17–63)
AST: <5 U/L — ABNORMAL LOW (ref 15–41)
Alkaline Phosphatase: 222 U/L — ABNORMAL HIGH (ref 38–126)
BILIRUBIN TOTAL: 1.4 mg/dL — AB (ref 0.3–1.2)
BUN: 45 mg/dL — AB (ref 6–20)
CALCIUM: 10 mg/dL (ref 8.9–10.3)
CO2: <5 mmol/L — ABNORMAL LOW (ref 22–32)
CREATININE: 2.39 mg/dL — AB (ref 0.61–1.24)
Chloride: 87 mmol/L — ABNORMAL LOW (ref 101–111)
GFR calc Af Amer: 34 mL/min — ABNORMAL LOW (ref >60)
GFR calc non Af Amer: 29 mL/min — ABNORMAL LOW (ref >60)
Glucose, Bld: 94 mg/dL (ref 65–99)
Potassium: 4.6 mmol/L (ref 3.5–5.1)
Sodium: 136 mmol/L (ref 135–145)
TOTAL PROTEIN: 8.1 g/dL (ref 6.5–8.1)

## 2014-08-04 LAB — RAPID URINE DRUG SCREEN, HOSP PERFORMED
AMPHETAMINES: NOT DETECTED
Barbiturates: NOT DETECTED
Benzodiazepines: NOT DETECTED
Cocaine: NOT DETECTED
OPIATES: NOT DETECTED
Tetrahydrocannabinol: NOT DETECTED

## 2014-08-04 LAB — MRSA PCR SCREENING: MRSA BY PCR: NEGATIVE

## 2014-08-04 LAB — LACTIC ACID, PLASMA
LACTIC ACID, VENOUS: 10.8 mmol/L — AB (ref 0.5–2.0)
LACTIC ACID, VENOUS: 8.9 mmol/L — AB (ref 0.5–2.0)
LACTIC ACID, VENOUS: 8.5 mmol/L — AB (ref 0.5–2.0)

## 2014-08-04 LAB — MAGNESIUM: Magnesium: 1.3 mg/dL — ABNORMAL LOW (ref 1.7–2.4)

## 2014-08-04 LAB — SALICYLATE LEVEL: Salicylate Lvl: <4 mg/dL (ref 2.8–30.0)

## 2014-08-04 LAB — URINE MICROSCOPIC-ADD ON

## 2014-08-04 LAB — PHOSPHORUS: Phosphorus: 9.2 mg/dL — ABNORMAL HIGH (ref 2.5–4.6)

## 2014-08-04 LAB — I-STAT CG4 LACTIC ACID, ED: Lactic Acid, Venous: >17 mmol/L (ref 0.5–2.0)

## 2014-08-04 LAB — ETHANOL

## 2014-08-04 LAB — LIPASE, BLOOD: LIPASE: 30 U/L (ref 22–51)

## 2014-08-04 LAB — LACTATE DEHYDROGENASE: LDH: 276 U/L — ABNORMAL HIGH (ref 98–192)

## 2014-08-04 LAB — ACETAMINOPHEN LEVEL: Acetaminophen (Tylenol), Serum: <10 ug/mL — ABNORMAL LOW (ref 10–30)

## 2014-08-04 LAB — PROTIME-INR
INR: 1.56 — AB (ref 0.00–1.49)
Prothrombin Time: 18.7 seconds — ABNORMAL HIGH (ref 11.6–15.2)

## 2014-08-04 LAB — CBG MONITORING, ED: GLUCOSE-CAPILLARY: 103 mg/dL — AB (ref 65–99)

## 2014-08-04 LAB — BETA-HYDROXYBUTYRIC ACID: Beta-Hydroxybutyric Acid: 6.23 mmol/L — ABNORMAL HIGH (ref 0.05–0.27)

## 2014-08-04 LAB — TROPONIN I: TROPONIN I: 0.05 ng/mL — AB (ref <0.031)

## 2014-08-04 MED ORDER — SODIUM CHLORIDE 0.9 % IV SOLN
1000.0000 mL | Freq: Once | INTRAVENOUS | Status: AC
  Administered 2014-08-04: 1000 mL via INTRAVENOUS

## 2014-08-04 MED ORDER — ACETAMINOPHEN 325 MG PO TABS
650.0000 mg | ORAL_TABLET | Freq: Four times a day (QID) | ORAL | Status: DC | PRN
  Administered 2014-08-04: 650 mg via ORAL
  Filled 2014-08-04: qty 2

## 2014-08-04 MED ORDER — SODIUM CHLORIDE 0.9 % IV SOLN
1000.0000 mL | INTRAVENOUS | Status: DC
  Administered 2014-08-04 – 2014-08-05 (×4): 1000 mL via INTRAVENOUS

## 2014-08-04 MED ORDER — SODIUM POLYSTYRENE SULFONATE 15 GM/60ML PO SUSP
30.0000 g | Freq: Once | ORAL | Status: AC
  Administered 2014-08-04: 30 g via ORAL
  Filled 2014-08-04: qty 120

## 2014-08-04 MED ORDER — CETYLPYRIDINIUM CHLORIDE 0.05 % MT LIQD
7.0000 mL | Freq: Two times a day (BID) | OROMUCOSAL | Status: DC
  Administered 2014-08-04 – 2014-08-05 (×4): 7 mL via OROMUCOSAL

## 2014-08-04 MED ORDER — INSULIN ASPART 100 UNIT/ML ~~LOC~~ SOLN
0.0000 [IU] | SUBCUTANEOUS | Status: DC
  Administered 2014-08-04 (×3): 8 [IU] via SUBCUTANEOUS
  Administered 2014-08-04: 11 [IU] via SUBCUTANEOUS
  Administered 2014-08-04: 3 [IU] via SUBCUTANEOUS

## 2014-08-04 MED ORDER — SODIUM CHLORIDE 0.9 % IV BOLUS (SEPSIS)
1000.0000 mL | Freq: Once | INTRAVENOUS | Status: AC
  Administered 2014-08-04: 1000 mL via INTRAVENOUS

## 2014-08-04 MED ORDER — THIAMINE HCL 100 MG/ML IJ SOLN
100.0000 mg | Freq: Every day | INTRAMUSCULAR | Status: DC
  Administered 2014-08-04 – 2014-08-05 (×2): 100 mg via INTRAVENOUS
  Filled 2014-08-04 (×2): qty 1

## 2014-08-04 MED ORDER — STERILE WATER FOR INJECTION IV SOLN
INTRAVENOUS | Status: DC
  Administered 2014-08-04 – 2014-08-05 (×3): via INTRAVENOUS
  Filled 2014-08-04 (×7): qty 850

## 2014-08-04 MED ORDER — HEPARIN SODIUM (PORCINE) 5000 UNIT/ML IJ SOLN
5000.0000 [IU] | Freq: Three times a day (TID) | INTRAMUSCULAR | Status: DC
  Administered 2014-08-04 – 2014-08-05 (×4): 5000 [IU] via SUBCUTANEOUS
  Filled 2014-08-04 (×4): qty 1

## 2014-08-04 MED ORDER — SODIUM CHLORIDE 0.9 % IV SOLN: 250.0000 mL | INTRAVENOUS | Status: DC | PRN

## 2014-08-04 MED ORDER — FOLIC ACID 5 MG/ML IJ SOLN
1.0000 mg | Freq: Every day | INTRAMUSCULAR | Status: DC
  Administered 2014-08-04 – 2014-08-05 (×2): 1 mg via INTRAVENOUS
  Filled 2014-08-04 (×2): qty 0.2

## 2014-08-04 NOTE — ED Notes (Signed)
Pt desat to 86% - placed on 4L [place], now O2 sat 97%. Will continue to closely monitor pt.

## 2014-08-04 NOTE — Progress Notes (Signed)
Pt taken off BIPAP and placed on 3L/[place], RT will continue to monitor

## 2014-08-04 NOTE — Progress Notes (Signed)
Pt placed on BIPAP due to tachypnea and increase WOB. RT will continue to monitor

## 2014-08-04 NOTE — ED Notes (Signed)
Pt arrives via [facility] with c/o abdominal distention and hypoglycemia. Pt noticed abd distention around noon today. Ems stated family came home around 2300 and noticed pt did not seem to be feeling well, blood sugar was 23 then, family gave pt food. Blood sugar from ems was 95. Pt had 2 episodes of vomiting, received 4mg  zofran iv. Pt has hx of cirrhosis, had to have dialysis in 2014 but stated he no longer requires it. Pt alert, oriented, nad.

## 2014-08-04 NOTE — Progress Notes (Signed)
Patient c/o some difficulty breathing. MD assessed patient. Received verbal order with read back for Bipap and Tylenol prn.

## 2014-08-04 NOTE — ED Notes (Signed)
Attempt to call report to floor x1. 

## 2014-08-04 NOTE — Progress Notes (Signed)
Pt stated he did not want to wear BiPAP at this time, pt is resting comfortably on nasal cannula O2 sat of 100% RT informed pt if he felt SOB that he would go back on BiPAP

## 2014-08-04 NOTE — Progress Notes (Signed)
CRITICAL VALUE ALERT  Critical value received:  Lactic acid : 8.5  Date of notification:  08/04/2014  Time of notification:  8727  Critical value read back:Yes.    Nurse who received alert:  Sandre Kitty  MD notified (1st page):  Expected value  Time of first page:   MD notified (2nd page):  Time of second page:  Responding MD:   Time MD responded:

## 2014-08-04 NOTE — H&P (Signed)
PULMONARY / CRITICAL CARE MEDICINE   Name: Arthur Woodard MRN: [id number] DOB: 03-Aug-1960    ADMISSION DATE:  08/04/2014 CONSULTATION DATE:  08/04/2014  REFERRING MD :  EDP  CHIEF COMPLAINT:  Weakness, hypoglycemia  INITIAL PRESENTATION:  54 y.o. M brought to [facility] ED with weakness and hypoglycemia.  Found to have profound AGMA with lactate > 17.  PCCM called for admission.    STUDIES:  CXR 6/16 >>> no acute process  SIGNIFICANT EVENTS: 6/16 - admit.   HISTORY OF PRESENT ILLNESS: Arthur Woodard is a 54 y.o. M with PMH as below who was brought to [facility] ED early AM hours 6/16 due to generalized weakness and dizziness.  He states he checked his glucose at home and noted it to be 23.  His family gave him some food which did not have any effect therefore EMS was dispatched.  On EMS arrival, CBG was 95.  He vomited twice en route to ED.  He reports that he was in his US[employer] earlier in the day and that he worked a full shift the day prior without any problems.  He has not had any recent fevers/chills/sweats, chest pain, SOB, N/V/D, abd pain, myalgias.  He reports he has been drinking roughly 1/5 of liquor about once to twice a week (he was encouraged to quit ETOH completely by Dr. Hilarie Fredrickson due to his cirrhosis and portal hypertension).  In ED, he was found to have metabolic acidosis (ABG 0.56 / 21 / 109) with lactate > 17.  He denies any ingestion of methanol, propylene glycol, ethylene glycol, salicylates.  He does take iron but states he has been taking just one daily.  Additional lab work significant for mild uremia (BUN 45), AoCKD (SCr 2.39).  PCCM was called for admission.  Pt follows with Dr. Hilarie Fredrickson for his cirrhosis and Dr. Jimmy Footman for his CKD.   PAST MEDICAL HISTORY :   has a past medical history of Cirrhosis; Splenomegaly, congestive, chronic; Thrombocytopenia; Liver nodule (05/15/2011); Allergy; Anemia; Diabetes mellitus; Hypertension; Liver lesion; Helicobacter pylori gastritis; and  Tubular adenoma of colon.  has past surgical history that includes Tooth extraction and Bone marrow biopsy. Prior to Admission medications   Medication Sig Start Date End Date Taking? Authorizing Provider  allopurinol (ZYLOPRIM) 300 MG tablet Take 300 mg by mouth daily.   Yes Historical Provider, MD  cetirizine (ZYRTEC) 10 MG tablet Take 10 mg by mouth daily as needed for allergies.    Yes Historical Provider, MD  FOLIC ACID PO Take 1 tablet by mouth daily. Pt takes 400 mcg daily   Yes Historical Provider, MD  furosemide (LASIX) 40 MG tablet Take 20 mg by mouth daily.    Yes Historical Provider, MD  guaiFENesin-codeine (ROBITUSSIN AC) 100-10 MG/5ML syrup Take 5 mLs by mouth 4 (four) times daily as needed for cough (cough).   Yes Historical Provider, MD  Iron TABS Take 1 tablet by mouth daily.   Yes Historical Provider, MD  metFORMIN (GLUCOPHAGE) 1000 MG tablet Take 1,000 mg by mouth 2 (two) times daily with a meal.   Yes Historical Provider, MD  metoprolol succinate (TOPROL-XL) 12.5 mg TB24 Take 12.5 mg by mouth 2 (two) times daily.   Yes Historical Provider, MD  traZODone (DESYREL) 50 MG tablet TAKE 1 TABLET BY MOUTH EVERY DAY AT BEDTIME Patient not taking: Reported on 08/04/2014 11/12/12   Jerene Bears, MD   No Known Allergies  FAMILY HISTORY:  Family History  Problem Relation Age of  Onset  . Colon cancer Neg Hx   . Esophageal cancer Neg Hx   . Rectal cancer Neg Hx   . Stomach cancer Neg Hx     SOCIAL HISTORY:  reports that he has never smoked. He has never used smokeless tobacco. He reports that he does not drink alcohol or use illicit drugs.  REVIEW OF SYSTEMS:  All negative; except for those that are bolded, which indicate positives.  Constitutional: weight loss, weight gain, night sweats, fevers, chills, fatigue, weakness.  HEENT: headache, sore throat, sneezing, nasal congestion, post nasal drip, difficulty swallowing, tooth/dental problems, visual complaints, visual changes, ear  aches. Neuro: difficulty with speech, weakness, numbness, ataxia. CV:  chest pain, orthopnea, PND, swelling in lower extremities, dizziness, palpitations, syncope.  Resp: cough, hemoptysis, dyspnea, wheezing. GI  heartburn, indigestion, abdominal pain, nausea, vomiting, diarrhea, constipation, change in bowel habits, loss of appetite, hematemesis, melena, hematochezia.  GU: dysuria, change in color of urine, urgency or frequency, flank pain, hematuria. MSK: joint pain or swelling, decreased range of motion. Psych: change in mood or affect, depression, anxiety, suicidal ideations, homicidal ideations. Skin: rash, itching, bruising.   SUBJECTIVE:  "I feel much better now".  VITAL SIGNS: Temp:  [97.4 F (36.3 C)] 97.4 F (36.3 C) (06/16 0227) Pulse Rate:  [116-125] 124 (06/16 0315) Resp:  [20-31] 31 (06/16 0315) BP: (104-148)/(50-79) 112/50 mmHg (06/16 0315) SpO2:  [97 %-99 %] 97 % (06/16 0315) HEMODYNAMICS:   VENTILATOR SETTINGS:   INTAKE / OUTPUT: Intake/Output    None     PHYSICAL EXAMINATION: General: WDWN AA male, in NAD. Neuro: A&O x 3, non-focal.  HEENT: [place]/AT. PERRL, sclerae anicteric. Cardiovascular: Tachy, regular, no M/R/G.  Lungs: Respirations even and unlabored.  CTA bilaterally, No W/R/R. Abdomen: BS x 4.  Abd distended, soft and NT.  No fluid wave appreciated. Musculoskeletal: No gross deformities, no edema.  Skin: Intact, warm, no rashes.  LABS:  CBC  Recent Labs Lab 08/04/14 0049  WBC 10.8*  HGB 10.8*  HCT 33.2*  PLT 122*   Coag's  Recent Labs Lab 08/04/14 0049  INR 1.56*   BMET  Recent Labs Lab 08/04/14 0049  NA 136  K 4.6  CL 87*  CO2 <5*  BUN 45*  CREATININE 2.39*  GLUCOSE 94   Electrolytes  Recent Labs Lab 08/04/14 0049  CALCIUM 10.0   Sepsis Markers  Recent Labs Lab 08/04/14 0248  LATICACIDVEN >17.00*   ABG  Recent Labs Lab 08/04/14 0303  PHART 6.986*  PCO2ART 20.9*  PO2ART 109.0*   Liver  Enzymes  Recent Labs Lab 08/04/14 0049  AST <5*  ALT 124*  ALKPHOS 222*  BILITOT 1.4*  ALBUMIN 3.6   Cardiac Enzymes No results for input(s): TROPONINI, PROBNP in the last 168 hours. Glucose  Recent Labs Lab 08/04/14 0032  GLUCAP 103*    Imaging Dg Chest 2 View  08/04/2014   CLINICAL DATA:  Shortness of breath and chest discomfort. History of cirrhosis, tachypneic, tachycardia. Weakness.  EXAM: CHEST  2 VIEW  COMPARISON:  09/08/2012  FINDINGS: The heart size and mediastinal contours are within normal limits. Both lungs are clear. The visualized skeletal structures are unremarkable.  IMPRESSION: No active cardiopulmonary disease.   Electronically Signed   By: Lucienne Capers M.D.   On: 08/04/2014 01:43    ASSESSMENT / PLAN:  RENAL A:   AGMA - lactate, uremia, metformin.  Salicylate and acetaminophen levels negative. AoCKD - followed by Dr. Jimmy Footman. Mild uremia. P:   Check  serum osmoles and calculate osmole gap to rule out other ingestions (calculated serum osmoles = ~ 293). Check beta hydroxybutyric acid, phosphate (given his hx renal disease), for completeness. Trend lactate. HCO3 in sterile water @ 100. BMP in AM.  PULMONARY A: Tachypnea - in setting compensation for metabolic acidosis.  P:   No interventions required, maintaining sats in high 90's.  CARDIOVASCULAR A:  Hx HTN. P:  Repeat troponin x 1. Hold outpatient toprol-xl, lasix.  GASTROINTESTINAL A:   ETOH cirrhosis with portal hypertension - follows with Dr. Hilarie Fredrickson once a year. Hx liver lesions - likely dysplastic nodules, also followed by Dr. Hilarie Fredrickson. Nutrition. P:   NPO for now.  HEMATOLOGIC A:   Chronic anemia. Macrocytosis - likely from ETOH. Chronic thrombocytopenia. VTE Prophylaxis. P:  Transfuse for Hgb < 7. Monitor platelets. SCD's / Heparin. CBC in AM.  INFECTIOUS A:   No indication of infection. P:   Monitor clinically.  ENDOCRINE A:   DM. Hypoglycemia -  resolved. P:   CBG's q4hr. SSI. Hold outpatient metformin.  NEUROLOGIC A:   ETOH use disorder. P:   Thiamine, folate. ETOH abstinence encouraged. Check UDS. Hold outpatient trazodone.  Family updated: None.  Interdisciplinary Family Meeting v Palliative Care Meeting:  Due by: 08/10/14.  Montey Hora, [place] Pulmonary & Critical Care Medicine Pager: [phone]  or [phone] 08/04/2014, 35:31 AM  54 year old male with poor liver and renal function presenting to the hospital with lethargy and hyperglycemia.  Found to have a lactic acid of 17.  Unknown reason.  Admit to the ICU for observation, serial lactic acids, IVF and f/u labs. K elevated, will write for kayexalate.  Hold in the ICU until lactic acid clears.  Pressors as needed for BP support.  Chest exam clear so will use fluid more liberally.  The patient is critically ill with multiple organ systems failure and requires high complexity decision making for assessment and support, frequent evaluation and titration of therapies, application of advanced monitoring technologies and extensive interpretation of multiple databases.   Critical Care Time devoted to patient care services described in this note is  35  Minutes. This time reflects time of care of this signee Dr Jennet Maduro. This critical care time does not reflect procedure time, or teaching time or supervisory time of PA/NP/Med student/Med Resident etc but could involve care discussion time.  Rush Farmer, M.D. [facility] Pulmonary/Critical Care Medicine. Pager: [phone]. After hours pager: [phone].

## 2014-08-04 NOTE — ED Notes (Signed)
Attempt to call report x2  

## 2014-08-04 NOTE — Progress Notes (Signed)
Patient is alert and oriented. He appears calm. He has remained tachy and tachypneic but does not c/o of difficulty breathing. MD made aware of ST and tachypnea. He is on 3L n/c and 96%. He had a some brief coughing trying to clear his throat and became diaphoretic briefly. E-link camera in and patient still denies difficulty catching breath. Will continue to monitor patient.

## 2014-08-04 NOTE — Progress Notes (Signed)
CRITICAL VALUE ALERT  Critical value received:  Lactic acid 10.8  Date of notification:  08/04/2014  Time of notification: 0806  Critical value read back: yes  Nurse who received alert:  Marlow Baars  MD notified (1st page):  Dr. Nelda Marseille  Time of first page:  0808  MD notified (2nd page):  Time of second page:  Responding MD:  Dr. Nelda Marseille  Time MD responded:  [phone]

## 2014-08-04 NOTE — ED Notes (Signed)
Respiratory at bedside obtaining arterial blood gas.

## 2014-08-04 NOTE — ED Provider Notes (Signed)
CSN: [id number]     Arrival date & time 08/04/14  0020 History  This chart was scribed for Linton Flemings, MD by Rayfield Citizen, ED Scribe. This patient was seen in room B19C/B19C and the patient's care was started at 12:52 AM.    Chief Complaint  Patient presents with  . Hypoglycemia  . Ascites   The history is provided by the patient. No language interpreter was used.     HPI Comments: Arthur Woodard is a 54 y.o. male brought in by ambulance who presents to the Emergency Department complaining of gradually worsening generalized weakness, headache, and dizziness that patient associates with hypoglycemia. He states he was feeling unwell this evening; his family returned home around 23:00 and described patient as "unwell." Patient states his blood sugar was 23 at that time; family gave him food. On EMS arrival, patient's blood sugar was 95. He also notes increased abdominal distension today. Per EMS, he had two episodes of vomiting and received 2m zofran IV. He denies SOB, fevers, chills.   No PCP. Nephrologist Dr. DJimmy Footman No prior paracentesis history; no longer on dialysis (previously dialyzed in 2014).    Past Medical History  Diagnosis Date  . Cirrhosis   . Splenomegaly, congestive, chronic   . Thrombocytopenia   . Liver nodule 05/15/2011  . Allergy   . Anemia   . Diabetes mellitus   . Hypertension   . Liver lesion   . Helicobacter pylori gastritis     severe  . Tubular adenoma of colon    Past Surgical History  Procedure Laterality Date  . Tooth extraction    . Bone marrow biopsy     Family History  Problem Relation Age of Onset  . Colon cancer Neg Hx   . Esophageal cancer Neg Hx   . Rectal cancer Neg Hx   . Stomach cancer Neg Hx    History  Substance Use Topics  . Smoking status: Never Smoker   . Smokeless tobacco: Never Used  . Alcohol Use: No    Review of Systems  Constitutional: Negative for fever and chills.  Respiratory: Negative for shortness of breath.    Gastrointestinal: Positive for vomiting and abdominal distention.  Neurological: Positive for dizziness, weakness and headaches.  All other systems reviewed and are negative.     Allergies  Review of patient's allergies indicates no known allergies.  Home Medications   Prior to Admission medications   Medication Sig Start Date End Date Taking? Authorizing Provider  allopurinol (ZYLOPRIM) 300 MG tablet Take 300 mg by mouth daily.    Historical Provider, MD  cetirizine (ZYRTEC) 10 MG tablet Take 10 mg by mouth daily.    Historical Provider, MD  FOLIC ACID PO Take 1 tablet by mouth daily. Pt takes 400 mcg daily    Historical Provider, MD  furosemide (LASIX) 40 MG tablet Take 40 mg by mouth daily.    Historical Provider, MD  guaiFENesin-codeine (ROBITUSSIN AC) 100-10 MG/5ML syrup Take 5 mLs by mouth 4 (four) times daily as needed for cough (cough).    Historical Provider, MD  Iron TABS Take 1 tablet by mouth daily.    Historical Provider, MD  metoprolol succinate (TOPROL-XL) 12.5 mg TB24 Take 12.5 mg by mouth 2 (two) times daily.    Historical Provider, MD  traZODone (DESYREL) 50 MG tablet TAKE 1 TABLET BY MOUTH EVERY DAY AT BEDTIME 11/12/12   JJerene Bears MD   BP 148/79 mmHg  Pulse 117  Temp(Src) 97.4  F (36.3 C) (Oral)  Resp 30  SpO2 98% Physical Exam  Constitutional: He is oriented to person, place, and time. He appears well-developed and well-nourished. No distress.  HENT:  Head: Normocephalic and atraumatic.  Eyes: EOM are normal. Pupils are equal, round, and reactive to light.  Neck: Normal range of motion. Neck supple. No JVD present.  Cardiovascular: Regular rhythm and normal heart sounds.  Tachycardia present.  Exam reveals no gallop and no friction rub.   No murmur heard. Pulmonary/Chest: Tachypnea noted.  Shallow breathing  Abdominal: Soft. Bowel sounds are normal. He exhibits distension. He exhibits no mass. There is no tenderness. There is no rebound and no guarding.   Abdominal distension; no pain  Musculoskeletal: Normal range of motion. He exhibits no edema.  Moves all extremities normally.   Neurological: He is alert and oriented to person, place, and time. He displays normal reflexes.  Skin: Skin is warm and dry. No rash noted.  Psychiatric: He has a normal mood and affect. His behavior is normal.  Nursing note and vitals reviewed.   ED Course  Procedures   DIAGNOSTIC STUDIES: Oxygen Saturation is 98% on RA, normal by my interpretation.    COORDINATION OF CARE: 12:58 AM Discussed treatment plan with pt at bedside and pt agreed to plan.  3:25 AM When discussing admission plans, patient admits to drinking "less than a fifth of EtOH, twice a week."   Labs Review Labs Reviewed  PROTIME-INR - Abnormal; Notable for the following:    Prothrombin Time 18.7 (*)    INR 1.56 (*)    All other components within normal limits  CBC WITH DIFFERENTIAL/PLATELET - Abnormal; Notable for the following:    WBC 10.8 (*)    RBC 2.97 (*)    Hemoglobin 10.8 (*)    HCT 33.2 (*)    MCV 111.8 (*)    MCH 36.4 (*)    Platelets 122 (*)    Lymphocytes Relative 10 (*)    Monocytes Relative 18 (*)    Neutro Abs 7.8 (*)    Monocytes Absolute 1.9 (*)    All other components within normal limits  COMPREHENSIVE METABOLIC PANEL - Abnormal; Notable for the following:    Chloride 87 (*)    CO2 <5 (*)    BUN 45 (*)    Creatinine, Ser 2.39 (*)    AST <5 (*)    ALT 124 (*)    Alkaline Phosphatase 222 (*)    Total Bilirubin 1.4 (*)    GFR calc non Af Amer 29 (*)    GFR calc Af Amer 34 (*)    All other components within normal limits  CBG MONITORING, ED - Abnormal; Notable for the following:    Glucose-Capillary 103 (*)    All other components within normal limits  I-STAT CG4 LACTIC ACID, ED - Abnormal; Notable for the following:    Lactic Acid, Venous >17.00 (*)    All other components within normal limits  LIPASE, BLOOD  SALICYLATE LEVEL  ACETAMINOPHEN LEVEL   I-STAT ARTERIAL BLOOD GAS, ED    Imaging Review Dg Chest 2 View  08/04/2014   CLINICAL DATA:  Shortness of breath and chest discomfort. History of cirrhosis, tachypneic, tachycardia. Weakness.  EXAM: CHEST  2 VIEW  COMPARISON:  09/08/2012  FINDINGS: The heart size and mediastinal contours are within normal limits. Both lungs are clear. The visualized skeletal structures are unremarkable.  IMPRESSION: No active cardiopulmonary disease.   Electronically Signed   By:  Lucienne Capers M.D.   On: 08/04/2014 01:43     EKG Interpretation None     CRITICAL CARE Performed by: Kalman Drape Total critical care time: 90 min Critical care time was exclusive of separately billable procedures and treating other patients. Critical care was necessary to treat or prevent imminent or life-threatening deterioration. Critical care was time spent personally by me on the following activities: development of treatment plan with patient and/or surrogate as well as nursing, discussions with consultants, evaluation of patient's response to treatment, examination of patient, obtaining history from patient or surrogate, ordering and performing treatments and interventions, ordering and review of laboratory studies, ordering and review of radiographic studies, pulse oximetry and re-evaluation of patient's condition.  MDM   Final diagnoses:  Weakness  Metabolic acidosis, increased anion gap (IAG)  Acute kidney injury  Alcoholic cirrhosis of liver with ascites    I personally performed the services described in this documentation, which was scribed in my presence. The recorded information has been reviewed and is accurate.  54 year old male, history of cirrhosis, diabetes, on metformin.  He has had one day of generalized weakness and malaise.  Patient did not eat dinner, feeling overall well.  Family check blood sugar and it was 23.  Patient has chronic kidney disease, required dialysis in 2014.  Patient noted to be  tachypneic and uncomfortable.  Plan for labs and reassessment.  Patient receive IV fluids.  3:06 AM Labs show significantly decreased bicarbonate, lactic acid greater than 17, patient is acidotic with a pH of 6.9.  Patient without ingestion, BUN/creatinine is elevated but not significantly so.  Will send salicylate and Tylenol levels, but doubt this is the cause.  Patient does take methanol, and could have a lactic acidosis from his metformin use combined with early uremia.  Given acidosis and tachycardia.  Will discuss with critical care.        Linton Flemings, MD 08/04/14 [phone]

## 2014-08-05 DIAGNOSIS — K7460 Unspecified cirrhosis of liver: Secondary | ICD-10-CM

## 2014-08-05 DIAGNOSIS — D696 Thrombocytopenia, unspecified: Secondary | ICD-10-CM

## 2014-08-05 LAB — BASIC METABOLIC PANEL
ANION GAP: 13 (ref 5–15)
BUN: 49 mg/dL — ABNORMAL HIGH (ref 6–20)
CO2: 31 mmol/L (ref 22–32)
Calcium: 6.9 mg/dL — ABNORMAL LOW (ref 8.9–10.3)
Chloride: 97 mmol/L — ABNORMAL LOW (ref 101–111)
Creatinine, Ser: 2.93 mg/dL — ABNORMAL HIGH (ref 0.61–1.24)
GFR calc Af Amer: 27 mL/min — ABNORMAL LOW (ref >60)
GFR, EST NON AFRICAN AMERICAN: 23 mL/min — AB (ref >60)
Glucose, Bld: 110 mg/dL — ABNORMAL HIGH (ref 65–99)
POTASSIUM: 3.2 mmol/L — AB (ref 3.5–5.1)
SODIUM: 141 mmol/L (ref 135–145)

## 2014-08-05 LAB — CBC
HCT: 22.8 % — ABNORMAL LOW (ref 39.0–52.0)
Hemoglobin: 8.2 g/dL — ABNORMAL LOW (ref 13.0–17.0)
MCH: 36.1 pg — ABNORMAL HIGH (ref 26.0–34.0)
MCHC: 36 g/dL (ref 30.0–36.0)
MCV: 100.4 fL — ABNORMAL HIGH (ref 78.0–100.0)
Platelets: 57 10*3/uL — ABNORMAL LOW (ref 150–400)
RBC: 2.27 MIL/uL — ABNORMAL LOW (ref 4.22–5.81)
RDW: 13.3 % (ref 11.5–15.5)
WBC: 7 10*3/uL (ref 4.0–10.5)

## 2014-08-05 LAB — GLUCOSE, CAPILLARY
GLUCOSE-CAPILLARY: 103 mg/dL — AB (ref 65–99)
Glucose-Capillary: 107 mg/dL — ABNORMAL HIGH (ref 65–99)
Glucose-Capillary: 110 mg/dL — ABNORMAL HIGH (ref 65–99)
Glucose-Capillary: 178 mg/dL — ABNORMAL HIGH (ref 65–99)
Glucose-Capillary: 142 mg/dL — ABNORMAL HIGH (ref 65–99)

## 2014-08-05 LAB — MAGNESIUM: Magnesium: 1 mg/dL — ABNORMAL LOW (ref 1.7–2.4)

## 2014-08-05 LAB — PHOSPHORUS: Phosphorus: 4.3 mg/dL (ref 2.5–4.6)

## 2014-08-05 MED ORDER — MAGNESIUM SULFATE 50 % IJ SOLN
4.0000 g | Freq: Once | INTRAMUSCULAR | Status: DC
  Filled 2014-08-05: qty 8

## 2014-08-05 MED ORDER — INSULIN ASPART 100 UNIT/ML ~~LOC~~ SOLN: 0.0000 [IU] | Freq: Every day | SUBCUTANEOUS | Status: DC

## 2014-08-05 MED ORDER — POTASSIUM CHLORIDE 10 MEQ/100ML IV SOLN
10.0000 meq | INTRAVENOUS | Status: AC
  Administered 2014-08-05 (×2): 10 meq via INTRAVENOUS
  Filled 2014-08-05 (×2): qty 100

## 2014-08-05 MED ORDER — MAGNESIUM SULFATE 4 GM/100ML IV SOLN
4.0000 g | Freq: Once | INTRAVENOUS | Status: AC
Start: 2014-08-05 — End: 2014-08-05
  Administered 2014-08-05: 4 g via INTRAVENOUS
  Filled 2014-08-05: qty 100

## 2014-08-05 MED ORDER — INSULIN ASPART 100 UNIT/ML ~~LOC~~ SOLN
0.0000 [IU] | Freq: Three times a day (TID) | SUBCUTANEOUS | Status: DC
  Administered 2014-08-05: 2 [IU] via SUBCUTANEOUS
  Administered 2014-08-06 (×3): 1 [IU] via SUBCUTANEOUS

## 2014-08-05 MED ORDER — PNEUMOCOCCAL VAC POLYVALENT 25 MCG/0.5ML IJ INJ
0.5000 mL | INJECTION | INTRAMUSCULAR | Status: AC
  Administered 2014-08-06: 0.5 mL via INTRAMUSCULAR
  Filled 2014-08-05: qty 0.5

## 2014-08-05 MED ORDER — TRAZODONE HCL 50 MG PO TABS
50.0000 mg | ORAL_TABLET | Freq: Every day | ORAL | Status: DC
  Administered 2014-08-05: 50 mg via ORAL
  Filled 2014-08-05 (×2): qty 1

## 2014-08-05 NOTE — Progress Notes (Signed)
eLink Physician-Brief Progress Note Patient Name: Arthur Woodard DOB: 03/17/1960 MRN: [id number]   Date of Service  08/05/2014  HPI/Events of Note  K, mg  eICU Interventions          Arthur Woodard. 08/05/2014, 6:27 AM

## 2014-08-05 NOTE — Discharge Summary (Addendum)
Physician Discharge Summary  Patient ID: Arthur Woodard MRN: [id number] DOB/AGE: [birth year] 54 y.o.  Admit date: 08/04/2014 Discharge date: 08/06/2014    Discharge Diagnoses:  Anion Gap Metabolic Acidosis  Acute on Chronic Kidney Disease Uremia  Hypokalemia Hypomagnesemia  Tachypnea  HTN ETOH Cirrhosis with Portal HTN Hx Liver Lesions  Chronic Anemia  Macrocytosis  Chronic Thrombocytopenia  Hypoglycemia  ETOH Abuse Metformin intolerance Lactic acidosis Hypophosphatemia                                                                       DISCHARGE PLAN BY DIAGNOSIS     Anion Gap Metabolic Acidosis  Acute on Chronic Kidney Disease Uremia  Hypokalemia Hypomagnesemia   Discharge Plan: Follow up with Pulmonary next week for hospital follow up with a CBC, CMET, Mg & Phos Potassium 20 mEq daily x 5 days Magnesium Oxide 400 mg TID x4 days   Tachypnea   Discharge Plan: Compensatory for acidosis, Resolved.    HTN  Discharge Plan: Resume lopressor 12.5 mg BID  ETOH Cirrhosis with Portal HTN Hx Liver Lesions   Discharge Plan: ETOH cessation encouraged  Follow up with Dr. Hilarie Fredrickson as previously scheduled   Chronic Anemia  Macrocytosis  Chronic Thrombocytopenia   Discharge Plan: Follow up CBC in office   Hypoglycemia   Discharge Plan: Hold metformin.  Do not restart (lactic acidosis).  Will defer outpatient regimen to primary MD.   ETOH Abuse   Discharge Plan: ETOH cessation encouraged Pt encouraged to get involved with AA                  DISCHARGE SUMMARY   Arthur Woodard is a 54 y.o. y/o male with a PMH of cirrhosis, splenomegaly, thrombocytopenia, liver nodule, anemia, DM, HTN, H. Pylori gastritis, and liver lesions who presented to the [facility] ED early AM hours 6/16 due to generalized weakness and dizziness. He states he checked his glucose at home and noted it to be 23. His family gave him some food which did not have any effect therefore EMS  was dispatched. On EMS arrival, CBG was 95. He vomited twice en route to ED.  On admit, he reported that he was in his USOH earlier in the day and that he worked a full shift the day prior without any problems. He has not had any recent fevers/chills/sweats, chest pain, SOB, N/V/D, abd pain, myalgias.  He reported he has been drinking roughly 1/5 of liquor about once to twice a week (he has been encouraged to quit ETOH completely by Dr. Hilarie Fredrickson due to his cirrhosis and portal hypertension).  In ED, he was found to have metabolic acidosis (ABG 2.95 / 21 / 109) with lactate > 17. He denied any ingestion of methanol, propylene glycol, ethylene glycol, salicylates. Additional lab work significant for mild uremia (BUN 45), AoCKD (SCr 2.39).  PCCM was called for admission. Pt follows with Dr. Hilarie Fredrickson for his cirrhosis and Dr. Jimmy Footman for his CKD.   The patient was admitted to ICU for observation.  Anion gap acidosis was thought to be in the setting lactic acid, uremia and metformin usage.   Salicylate and acetaminophen levels were assessed & negative.  He was treated with IV bicarbonate, thiamine, folate  and MVI.  He required bipap support for increased work of breathing overnight 6/16.  He was transitioned to O2 by [employer].  Hospital course was complicated by electrolyte disturbances (K, Mg).  Metformin was held on admit in the setting of lactic acidosis.  CBG's ranged 120-140's (did have one isolated CBG of 273 on admit).  Glucose control was achieved with sliding scale insulin.  Electrolytes were replaced.  Follow up labs were within acceptable limits.  The patient was medically cleared on 6/18 for discharge with plans as above.             SIGNIFICANT EVENTS: 6/16  Admit with weakness, hypoglycemia   SIGNIFICANT DIAGNOSTIC STUDIES CXR 6/16 >>> no acute process    Discharge Exam: General: wdwn adult male in NAD Neuro: AAOx4, MAE CV: s1s2 rrr, no m/r/g PULM: resp's even/non-labored, lungs  bilaterally clear GI: NTND Extremities: warm/dry, no edema    Filed Vitals:   08/05/14 1600 08/05/14 2001 08/06/14 0436 08/06/14 0855  BP: 151/81 139/84 142/82 126/65  Pulse: 91 96 96 85  Temp: 98.4 F (36.9 C) 98.8 F (37.1 C) 98.9 F (37.2 C) 97.9 F (36.6 C)  TempSrc: Oral   Oral  Resp: 20 18 18 18   Height:      Weight:  149 lb 14.6 oz (68 kg)    SpO2: 96% 100% 93% 98%    Discharge Labs  BMET  Recent Labs Lab 08/04/14 0049 08/04/14 0707 08/04/14 1530 08/05/14 0215 08/06/14 0614 08/06/14 1144  NA 136 134* 136 141 141  --   K 4.6 5.9* 4.2 3.2* 2.9*  --   CL 87* 96* 98* 97* 95*  --   CO2 <5* 10* 20* 31 34*  --   GLUCOSE 94 284* 308* 110* 111*  --   BUN 45* 45* 48* 49* 45*  --   CREATININE 2.39* 2.66* 2.84* 2.93* 2.34*  --   CALCIUM 10.0 7.8* 7.6* 6.9* 7.1*  --   MG  --  1.3*  --  1.0*  --  1.6*  PHOS  --  9.2*  --  4.3  --  2.6   CBC  Recent Labs Lab 08/04/14 0707 08/05/14 0215 08/06/14 0614  HGB 9.8* 8.2* 8.7*  HCT 28.9* 22.8* 24.5*  WBC 10.3 7.0 3.9*  PLT 102* 57* 59*   Anti-Coagulation  Recent Labs Lab 08/04/14 0049  INR 1.56*   Discharge Instructions    Call MD for:  difficulty breathing, headache or visual disturbances    Complete by:  As directed      Call MD for:  extreme fatigue    Complete by:  As directed      Call MD for:  hives    Complete by:  As directed      Call MD for:  persistant dizziness or light-headedness    Complete by:  As directed      Call MD for:  persistant nausea and vomiting    Complete by:  As directed      Call MD for:  severe uncontrolled pain    Complete by:  As directed      Call MD for:  temperature >100.4    Complete by:  As directed      Diet - low sodium heart healthy    Complete by:  As directed      Discharge instructions    Complete by:  As directed   1.  Review your medications carefully as they have changed.  2.  DO NOT TAKE METFORMIN  3.  Take potassium / magnesium as prescribed 4.  It is  very important that you follow up next week with Pulmonary for lab review 5.  HOLD your lasix until seen by primary MD 6.  Stop drinking alcohol.  Get involved in local AA     Increase activity slowly    Complete by:  As directed               Follow-up Information    Follow up with PARRETT,TAMMY, NP.   Specialty:  Nurse Practitioner   Why:  For hospital follow up - will need CBC, BMP, Mag at time of follow up.  Office will call you with an appointment.  If you do not hear anything on Monday 6/20, please call for an appointment.     Contact information:   [address] [place] [address] [phone]       Follow up with [facility]. Schedule an appointment as soon as possible for a visit in 1 week.   Why:  For hosptial follow up and review of your diabetes management.     Contact information:   [address] [place] [address] [phone]      Follow up with Jerene Bears, MD.   Specialty:  Gastroenterology   Why:  As needed   Contact information:   [place] [place] [address] [phone]        Medication List    STOP taking these medications        furosemide 40 MG tablet  Commonly known as:  LASIX     guaiFENesin-codeine 100-10 MG/5ML syrup  Commonly known as:  ROBITUSSIN AC     metFORMIN 1000 MG tablet  Commonly known as:  GLUCOPHAGE     metoprolol succinate 12.5 mg Tb24 24 hr tablet  Commonly known as:  TOPROL-XL     traZODone 50 MG tablet  Commonly known as:  DESYREL      TAKE these medications        allopurinol 300 MG tablet  Commonly known as:  ZYLOPRIM  Take 300 mg by mouth daily.     cetirizine 10 MG tablet  Commonly known as:  ZYRTEC  Take 10 mg by mouth daily as needed for allergies.     FOLIC ACID PO  Take 1 tablet by mouth daily. Pt takes 400 mcg daily     Iron Tabs  Take 1 tablet by mouth daily.     Magnesium Oxide 400 (240 MG) MG Tabs  Take 1 tablet by  mouth 3 (three) times daily.  Start taking on:  08/07/2014     metoprolol tartrate 25 MG tablet  Commonly known as:  LOPRESSOR  Take 0.5 tablets (12.5 mg total) by mouth 2 (two) times daily.     Potassium Chloride ER 20 MEQ Tbcr  Take 20 mEq by mouth daily.  Start taking on:  08/07/2014          Disposition:  Home.  Discharged Condition: Arthur Woodard has met maximum benefit of inpatient care and is medically stable and cleared for discharge.  Patient is pending follow up as above.      Time spent on disposition:  Greater than 35 minutes.   Signed: Noe Gens, NP-C [facility] Pgr: [phone] Office: [phone]    Pt seen and examined with NP Alfredo Martinez and I agree with the  above DC summary. Mariel Sleet Beeper  [phone]  Cell  [phone]  If no response or cell goes to voicemail, call beeper [phone]

## 2014-08-05 NOTE — Care Management Note (Signed)
Case Management Note   Patient Details  Name: HIEU HERMS MRN: [id number] Date of Birth: 1960-03-30  Subjective/Objective:   Pt admitted with metabolic acidosis                 Action/Plan: PTA pt lived at home- anticipate return home when medically stable- per MD request to see pt regarding PCP needs-- per chart pt has [facility]- and PCP listed- spoke with pt at bedside- per pt his PCP that is listed is retiring and pt does need to find a new PCP- confirmed that pt has [facility] PPO- pt reports that he recently received letter in mail that he needs to use in-network providers and pharmacy that is in-network- offered to pt [facility] referral line to assist pt in finding a new PCP- and assisted pt in calling the referral line for the first time- [employer] gave pt 3 providers that are currently accepting new pt-  Pt to f/u with one of these to see about establishing care- pt understands that is these 3 do not work out he can call referral line again and get another list of names- Dr. Lindell Noe with [facility] is one of the providers and pt states that he plans to call that one first. No other CM needs noted at this time.   Expected Discharge Date:                  Expected Discharge Plan:  Home/Self Care  In-House Referral:     Discharge planning Services  CM Consult  Post Acute Care Choice:    Choice offered to:     DME Arranged:    DME Agency:     HH Arranged:    HH Agency:     Status of Service:  In process, will continue to follow  Medicare Important Message Given:    Date Medicare IM Given:    Medicare IM give by:    Date Additional Medicare IM Given:    Additional Medicare Important Message give by:     If discussed at [place] of Stay Meetings, dates discussed:    Additional Comments:  Dawayne Patricia, [phone] 08/05/2014, 12:12 PM

## 2014-08-05 NOTE — Progress Notes (Signed)
No distress No complaints  Filed Vitals:   08/05/14 1200 08/05/14 1300 08/05/14 1500 08/05/14 1600  BP: 135/78 139/85  151/81  Pulse: 87 93 86 91  Temp:    98.4 F (36.9 C)  TempSrc:    Oral  Resp: 20 21 22 20   Height:      Weight:      SpO2: 95% 95% 98% 96%   Pleasant, appropriate NAD No JVD Chest clear RRR s M NABS No edema Neuro intact  BMET    Component Value Date/Time   NA 141 08/05/2014 0215   NA 137 11/15/2011 0926   K 3.2* 08/05/2014 0215   K 4.5 11/15/2011 0926   CL 97* 08/05/2014 0215   CL 106 11/15/2011 0926   CO2 31 08/05/2014 0215   CO2 22 11/15/2011 0926   GLUCOSE 110* 08/05/2014 0215   GLUCOSE 120* 11/15/2011 0926   GLUCOSE 116* 02/10/2006 1102   BUN 49* 08/05/2014 0215   BUN 15.0 11/15/2011 0926   CREATININE 2.93* 08/05/2014 0215   CREATININE 0.9 11/15/2011 0926   CALCIUM 6.9* 08/05/2014 0215   CALCIUM 8.4 11/15/2011 0926   GFRNONAA 23* 08/05/2014 0215   GFRAA 27* 08/05/2014 0215    CBC    Component Value Date/Time   WBC 7.0 08/05/2014 0215   WBC 3.5* 11/15/2011 0926   RBC 2.27* 08/05/2014 0215   RBC 2.84* 07/01/2012 1703   RBC 3.49* 11/15/2011 0926   HGB 8.2* 08/05/2014 0215   HGB 12.6* 11/15/2011 0926   HCT 22.8* 08/05/2014 0215   HCT 36.0* 11/15/2011 0926   PLT 57* 08/05/2014 0215   PLT 69* 11/15/2011 0926   MCV 100.4* 08/05/2014 0215   MCV 103.2* 11/15/2011 0926   MCH 36.1* 08/05/2014 0215   MCH 36.0* 11/15/2011 0926   MCHC 36.0 08/05/2014 0215   MCHC 34.9 11/15/2011 0926   RDW 13.3 08/05/2014 0215   RDW 13.6 11/15/2011 0926   LYMPHSABS 1.1 08/04/2014 0049   LYMPHSABS 1.2 11/15/2011 0926   MONOABS 1.9* 08/04/2014 0049   MONOABS 0.6 11/15/2011 0926   EOSABS 0.0 08/04/2014 0049   EOSABS 0.2 11/15/2011 0926   BASOSABS 0.0 08/04/2014 0049   BASOSABS 0.0 11/15/2011 0926    CXR: NNF  IMPRESSION: Severe lactic acidosis in the setting of metfromin, cirrhosis and AKI  Resolved AKI, nonoliguric-  Exact etiology  unclear Hypokalemia due to HCO3 gtt Alcoholic cirrhosis Thrombocytopenia   He is markedly improved and might be ready for DC home 6/18  PLAN: Transfer to med surg Advance diet and activity Change SSI to ACHS He should not be on metformin in setting of liver disease Recheck BMET in AM 6/18 Recheck CBC in AM 6/18 DC SQ heparin I have asked care manager to help arrange outpt follow up as he has lost his primary care MD   Merton Border, MD ; [facility] [place] service Mobile [phone].  After 5:30 PM or weekends, call [phone]

## 2014-08-06 DIAGNOSIS — T383X5A Adverse effect of insulin and oral hypoglycemic [antidiabetic] drugs, initial encounter: Secondary | ICD-10-CM | POA: Diagnosis present

## 2014-08-06 DIAGNOSIS — E872 Acidosis, unspecified: Secondary | ICD-10-CM | POA: Diagnosis present

## 2014-08-06 DIAGNOSIS — E876 Hypokalemia: Secondary | ICD-10-CM

## 2014-08-06 DIAGNOSIS — T383X5D Adverse effect of insulin and oral hypoglycemic [antidiabetic] drugs, subsequent encounter: Secondary | ICD-10-CM

## 2014-08-06 LAB — CBC
HCT: 24.5 % — ABNORMAL LOW (ref 39.0–52.0)
Hemoglobin: 8.7 g/dL — ABNORMAL LOW (ref 13.0–17.0)
MCH: 34.9 pg — AB (ref 26.0–34.0)
MCHC: 35.5 g/dL (ref 30.0–36.0)
MCV: 98.4 fL (ref 78.0–100.0)
Platelets: 59 10*3/uL — ABNORMAL LOW (ref 150–400)
RBC: 2.49 MIL/uL — AB (ref 4.22–5.81)
RDW: 13.6 % (ref 11.5–15.5)
WBC: 3.9 10*3/uL — ABNORMAL LOW (ref 4.0–10.5)

## 2014-08-06 LAB — BASIC METABOLIC PANEL
Anion gap: 12 (ref 5–15)
BUN: 45 mg/dL — AB (ref 6–20)
CO2: 34 mmol/L — ABNORMAL HIGH (ref 22–32)
CREATININE: 2.34 mg/dL — AB (ref 0.61–1.24)
Calcium: 7.1 mg/dL — ABNORMAL LOW (ref 8.9–10.3)
Chloride: 95 mmol/L — ABNORMAL LOW (ref 101–111)
GFR calc non Af Amer: 30 mL/min — ABNORMAL LOW (ref >60)
GFR, EST AFRICAN AMERICAN: 35 mL/min — AB (ref >60)
Glucose, Bld: 111 mg/dL — ABNORMAL HIGH (ref 65–99)
Potassium: 2.9 mmol/L — ABNORMAL LOW (ref 3.5–5.1)
Sodium: 141 mmol/L (ref 135–145)

## 2014-08-06 LAB — GLUCOSE, CAPILLARY
GLUCOSE-CAPILLARY: 136 mg/dL — AB (ref 65–99)
GLUCOSE-CAPILLARY: 126 mg/dL — AB (ref 65–99)
Glucose-Capillary: 124 mg/dL — ABNORMAL HIGH (ref 65–99)

## 2014-08-06 LAB — LACTIC ACID, PLASMA: Lactic Acid, Venous: 1.5 mmol/L (ref 0.5–2.0)

## 2014-08-06 LAB — PHOSPHORUS: Phosphorus: 2.6 mg/dL (ref 2.5–4.6)

## 2014-08-06 LAB — MAGNESIUM: Magnesium: 1.6 mg/dL — ABNORMAL LOW (ref 1.7–2.4)

## 2014-08-06 MED ORDER — METOPROLOL TARTRATE 25 MG PO TABS: 12.5000 mg | ORAL_TABLET | Freq: Two times a day (BID) | ORAL | Status: DC

## 2014-08-06 MED ORDER — POTASSIUM CHLORIDE ER 20 MEQ PO TBCR: 20.0000 meq | EXTENDED_RELEASE_TABLET | Freq: Every day | ORAL | Status: DC

## 2014-08-06 MED ORDER — MAGNESIUM OXIDE -MG SUPPLEMENT 400 (240 MG) MG PO TABS: 1.0000 | ORAL_TABLET | Freq: Three times a day (TID) | ORAL | Status: AC

## 2014-08-06 MED ORDER — MAGNESIUM OXIDE 400 (241.3 MG) MG PO TABS
800.0000 mg | ORAL_TABLET | Freq: Once | ORAL | Status: AC
Start: 2014-08-06 — End: 2014-08-06
  Administered 2014-08-06: 800 mg via ORAL
  Filled 2014-08-06: qty 2

## 2014-08-06 MED ORDER — POTASSIUM CHLORIDE CRYS ER 20 MEQ PO TBCR
40.0000 meq | EXTENDED_RELEASE_TABLET | Freq: Once | ORAL | Status: AC
  Administered 2014-08-06: 40 meq via ORAL
  Filled 2014-08-06: qty 2

## 2014-08-06 NOTE — Progress Notes (Addendum)
PCCM  Note I ordered Mg, PO4, lactic acid STAT.  Plan d/c later this PM:  Would d/c on :  Potassium 80meq daily x 5 doses Magnesium Oxide 400mg  tid x 4 days    Needs OV with tammy parrett on this Wednesday with CMET and CBC and Mg and PO4 checked  [facility] Agustin Cree al.  No alcohol.  No more metformin, ever. No insulin for now. Will have to determine long term DM control program as outpt with new PCP.  Mariel Sleet Beeper  [phone]  Cell  [phone]  If no response or cell goes to voicemail, call beeper [phone]

## 2014-08-09 ENCOUNTER — Telehealth: Payer: Self-pay | Admitting: Behavioral Health

## 2014-08-09 LAB — HEMOGLOBIN A1C
Hgb A1c MFr Bld: 4.6 % — ABNORMAL LOW (ref 4.8–5.6)
MEAN PLASMA GLUCOSE: 85 mg/dL

## 2014-08-09 NOTE — Telephone Encounter (Signed)
Unable to reach patient at time of Pre-Visit Call.  Left message for patient to return call when available.    

## 2014-08-10 ENCOUNTER — Ambulatory Visit (INDEPENDENT_AMBULATORY_CARE_PROVIDER_SITE_OTHER): Payer: 59 | Admitting: Medical

## 2014-08-10 ENCOUNTER — Encounter: Payer: Self-pay | Admitting: Medical

## 2014-08-10 VITALS — BP 160/90 | HR 64 | Temp 98.5°F | Ht 63.0 in | Wt 141.8 lb

## 2014-08-10 DIAGNOSIS — D649 Anemia, unspecified: Secondary | ICD-10-CM

## 2014-08-10 DIAGNOSIS — E1129 Type 2 diabetes mellitus with other diabetic kidney complication: Secondary | ICD-10-CM

## 2014-08-10 DIAGNOSIS — K7030 Alcoholic cirrhosis of liver without ascites: Secondary | ICD-10-CM | POA: Diagnosis not present

## 2014-08-10 DIAGNOSIS — I10 Essential (primary) hypertension: Secondary | ICD-10-CM

## 2014-08-10 DIAGNOSIS — F1010 Alcohol abuse, uncomplicated: Secondary | ICD-10-CM | POA: Diagnosis not present

## 2014-08-10 DIAGNOSIS — E876 Hypokalemia: Secondary | ICD-10-CM

## 2014-08-10 DIAGNOSIS — M1000 Idiopathic gout, unspecified site: Secondary | ICD-10-CM

## 2014-08-10 LAB — CBC WITH DIFFERENTIAL/PLATELET
BASOS ABS: 0 10*3/uL (ref 0.0–0.1)
Basophils Relative: 0.4 % (ref 0.0–3.0)
Eosinophils Absolute: 0.2 10*3/uL (ref 0.0–0.7)
Eosinophils Relative: 5.4 % — ABNORMAL HIGH (ref 0.0–5.0)
HCT: 30.3 % — ABNORMAL LOW (ref 39.0–52.0)
Hemoglobin: 10.2 g/dL — ABNORMAL LOW (ref 13.0–17.0)
LYMPHS ABS: 1.4 10*3/uL (ref 0.7–4.0)
Lymphocytes Relative: 30.2 % (ref 12.0–46.0)
MCHC: 33.5 g/dL (ref 30.0–36.0)
MCV: 107.9 fl — ABNORMAL HIGH (ref 78.0–100.0)
MONOS PCT: 20.5 % — AB (ref 3.0–12.0)
Monocytes Absolute: 0.9 10*3/uL (ref 0.1–1.0)
NEUTROS PCT: 43.5 % (ref 43.0–77.0)
Neutro Abs: 2 10*3/uL (ref 1.4–7.7)
PLATELETS: 97 10*3/uL — AB (ref 150.0–400.0)
RBC: 2.81 Mil/uL — ABNORMAL LOW (ref 4.22–5.81)
RDW: 15.9 % — ABNORMAL HIGH (ref 11.5–15.5)
WBC: 4.6 10*3/uL (ref 4.0–10.5)

## 2014-08-10 LAB — COMPREHENSIVE METABOLIC PANEL
ALT: 66 U/L — ABNORMAL HIGH (ref 0–53)
AST: 119 U/L — ABNORMAL HIGH (ref 0–37)
Albumin: 3.3 g/dL — ABNORMAL LOW (ref 3.5–5.2)
Alkaline Phosphatase: 160 U/L — ABNORMAL HIGH (ref 39–117)
BILIRUBIN TOTAL: 1.9 mg/dL — AB (ref 0.2–1.2)
BUN: 17 mg/dL (ref 6–23)
CO2: 30 mEq/L (ref 19–32)
Calcium: 8.2 mg/dL — ABNORMAL LOW (ref 8.4–10.5)
Chloride: 102 mEq/L (ref 96–112)
Creatinine, Ser: 0.98 mg/dL (ref 0.40–1.50)
GFR: 102.79 mL/min (ref >60.00)
Glucose, Bld: 101 mg/dL — ABNORMAL HIGH (ref 70–99)
Potassium: 3.9 mEq/L (ref 3.5–5.1)
Sodium: 140 mEq/L (ref 135–145)
TOTAL PROTEIN: 7.2 g/dL (ref 6.0–8.3)

## 2014-08-10 LAB — GAMMA GT: GGT: 1194 U/L — AB (ref 7–51)

## 2014-08-10 LAB — AMYLASE: AMYLASE: 92 U/L (ref 27–131)

## 2014-08-10 LAB — MAGNESIUM: MAGNESIUM: 1.1 mg/dL — AB (ref 1.5–2.5)

## 2014-08-10 LAB — LIPASE: LIPASE: 15 U/L (ref 11.0–59.0)

## 2014-08-10 MED ORDER — AMLODIPINE BESYLATE 2.5 MG PO TABS: 2.5000 mg | ORAL_TABLET | Freq: Every day | ORAL | Status: DC

## 2014-08-10 NOTE — Assessment & Plan Note (Signed)
Stop alchohol This would help gout. May need to stop allopurinol if kidney function still poor.

## 2014-08-10 NOTE — Patient Instructions (Signed)
Essential hypertension Recent high bp 3 consecutive readings. Already on b blocker and pulse in 60's. Will add low dose amlodipine 2.5 mg a day. Follow up in one week.  Alcoholic cirrhosis Check lft today and ggt.  Diabetes mellitus a1c was low recent check but while in hospital bs was moderate high 200-300 range. So will ask pt to check his bs daily and bring in reading next week. Metfomrin recently stopped due to acidosis.  Alcohol abuse Asked to seriously assess amount your drink. Decrease/stop and attend AA if appropiate (What i gather on this visit is this would be necessary  Hypokalemia Receck k level today.  Hypomagnesemia Recheck mg level.  Anemia Recheck cbc today.  Gout Stop alchohol This would help gout. May need to stop allopurinol if kidney function still poor.     Follow up in 1 wk or as needed.(on follow asked for 30 minute slot)

## 2014-08-10 NOTE — Assessment & Plan Note (Signed)
Check lft today and ggt.

## 2014-08-10 NOTE — Assessment & Plan Note (Signed)
Recent high bp 3 consecutive readings. Already on b blocker and pulse in 60's. Will add low dose amlodipine 2.5 mg a day. Follow up in one week.

## 2014-08-10 NOTE — Progress Notes (Signed)
Pre visit review using our clinic review tool, if applicable. No additional management support is needed unless otherwise documented below in the visit note. 

## 2014-08-10 NOTE — Assessment & Plan Note (Signed)
Recheck cbc today.   

## 2014-08-10 NOTE — Assessment & Plan Note (Signed)
Recheck mg level.

## 2014-08-10 NOTE — Assessment & Plan Note (Signed)
a1c was low recent check but while in hospital bs was moderate high 200-300 range. So will ask pt to check his bs daily and bring in reading next week. Metfomrin recently stopped due to acidosis.

## 2014-08-10 NOTE — Assessment & Plan Note (Signed)
Asked to seriously assess amount your drink. Decrease/stop and attend AA if appropiate (What i gather on this visit is this would be necessary

## 2014-08-10 NOTE — Progress Notes (Signed)
Subjective:    Patient ID: Arthur Woodard, male    DOB: [birth year], 54 y.o.   MRN: [id number]  HPI   I have reviewed pt PMH, PSH, FH, Social History and Surgical History  Allergies- year round. On zytrec. Controlled now.  Anemia- in past was on iron for 3 years. Check at hospital recently.  Pt has some chronic kidney disease with hospitalization recently.   Gout- Pt is on allopurinol.   Some hypokalemia in hospital and low magnesium  History of alcohol abuse- Pt still drinks. Per wife she states every day. Wife states his report of alchohol use is incorrect. He states twice 2-4 shots twice a week.   Cirrhosis- hx of portal htn.  Pt has history of diabetes type II- recently stopped metformin. He had metabolic acidosis.  Discharged on Saturday. He states he feels fine.   Transient neck pain when in hospital but now resolved.    Review of Systems  Constitutional: Negative for fever, chills, diaphoresis, activity change and fatigue.  Respiratory: Negative for cough, chest tightness and shortness of breath.   Cardiovascular: Negative for chest pain, palpitations and leg swelling.  Gastrointestinal: Negative for nausea, vomiting and abdominal pain.  Musculoskeletal: Negative for neck pain and neck stiffness.  Neurological: Negative for dizziness, tremors, seizures, syncope, facial asymmetry, speech difficulty, weakness, light-headedness, numbness and headaches.  Psychiatric/Behavioral: Negative for behavioral problems, confusion and agitation. The patient is not nervous/anxious.    Past Medical History  Diagnosis Date  . Cirrhosis   . Splenomegaly, congestive, chronic   . Thrombocytopenia   . Liver nodule 05/15/2011  . Allergy   . Anemia   . Diabetes mellitus   . Hypertension   . Liver lesion   . Helicobacter pylori gastritis     severe  . Tubular adenoma of colon   . Sleep apnea   . Chronic kidney disease     History   Social History  . Marital Status:  Married    Spouse Name: N/A  . Number of Children: 2  . Years of Education: N/A   Occupational History  . unemployed    Social History Main Topics  . Smoking status: Never Smoker   . Smokeless tobacco: Never Used  . Alcohol Use: No  . Drug Use: No  . Sexual Activity: No   Other Topics Concern  . Not on file   Social History Narrative    Past Surgical History  Procedure Laterality Date  . Tooth extraction    . Bone marrow biopsy      Family History  Problem Relation Age of Onset  . Colon cancer Neg Hx   . Esophageal cancer Neg Hx   . Rectal cancer Neg Hx   . Stomach cancer Neg Hx     No Known Allergies  Current Outpatient Prescriptions on File Prior to Visit  Medication Sig Dispense Refill  . allopurinol (ZYLOPRIM) 300 MG tablet Take 300 mg by mouth daily.    . cetirizine (ZYRTEC) 10 MG tablet Take 10 mg by mouth daily as needed for allergies.     Marland Kitchen FOLIC ACID PO Take 1 tablet by mouth daily. Pt takes 400 mcg daily    . Iron TABS Take 1 tablet by mouth daily.    . Magnesium Oxide 400 (240 MG) MG TABS Take 1 tablet by mouth 3 (three) times daily. 12 tablet 0  . metoprolol tartrate (LOPRESSOR) 25 MG tablet Take 0.5 tablets (12.5 mg total) by mouth 2 (two)  times daily. 60 tablet 3  . potassium chloride 20 MEQ TBCR Take 20 mEq by mouth daily. 5 tablet 0   No current facility-administered medications on file prior to visit.    BP 160/90 mmHg  Pulse 64  Temp(Src) 98.5 F (36.9 C) (Oral)  Ht 5' 3"  (1.6 m)  Wt 141 lb 12.8 oz (64.32 kg)  BMI 25.13 kg/m2  SpO2 98%       Objective:   Physical Exam  General Mental Status- Alert. General Appearance- Not in acute distress.   Skin General: Color- Normal Color. Moisture- Normal Moisture.  Neck Carotid Arteries- Normal color. Moisture- Normal Moisture. No carotid bruits. No JVD.  Chest and Lung Exam Auscultation: Breath Sounds:-Normal.  Cardiovascular Auscultation:Rythm- Regular. Murmurs & Other Heart  Sounds:Auscultation of the heart reveals- No Murmurs.  Abdomen Inspection:-Inspeection Normal. No ascites and no hepatomegaly appreciated today. Palpation/Percussion:Note:No mass. Palpation and Percussion of the abdomen reveal- Non Tender, Non Distended + BS, no rebound or guarding.    Neurologic Cranial Nerve exam:- CN III-XII intact(No nystagmus), symmetric smile. Drift Test:- No drift. Romberg Exam:- Negative.  Heal to Toe Gait exam:-Normal. Finger to Nose:- Normal/Intact Strength:- 5/5 equal and symmetric strength both upper and lower extremities.      Assessment & Plan:

## 2014-08-10 NOTE — Assessment & Plan Note (Signed)
Receck k level today.

## 2014-08-18 ENCOUNTER — Encounter: Payer: Self-pay | Admitting: Medical

## 2014-08-18 ENCOUNTER — Ambulatory Visit (INDEPENDENT_AMBULATORY_CARE_PROVIDER_SITE_OTHER): Payer: 59 | Admitting: Medical

## 2014-08-18 VITALS — BP 145/85 | HR 74 | Temp 98.7°F | Ht 63.0 in | Wt 132.6 lb

## 2014-08-18 DIAGNOSIS — K7030 Alcoholic cirrhosis of liver without ascites: Secondary | ICD-10-CM | POA: Diagnosis not present

## 2014-08-18 DIAGNOSIS — D649 Anemia, unspecified: Secondary | ICD-10-CM

## 2014-08-18 DIAGNOSIS — I10 Essential (primary) hypertension: Secondary | ICD-10-CM

## 2014-08-18 LAB — COMPREHENSIVE METABOLIC PANEL
ALBUMIN: 3.8 g/dL (ref 3.5–5.2)
ALT: 92 U/L — ABNORMAL HIGH (ref 0–53)
AST: 221 U/L — ABNORMAL HIGH (ref 0–37)
Alkaline Phosphatase: 327 U/L — ABNORMAL HIGH (ref 39–117)
BUN: 16 mg/dL (ref 6–23)
CO2: 27 mEq/L (ref 19–32)
Calcium: 9.1 mg/dL (ref 8.4–10.5)
Chloride: 103 mEq/L (ref 96–112)
Creatinine, Ser: 0.9 mg/dL (ref 0.40–1.50)
GFR: 113.39 mL/min (ref >60.00)
GLUCOSE: 116 mg/dL — AB (ref 70–99)
POTASSIUM: 4.1 meq/L (ref 3.5–5.1)
Sodium: 139 mEq/L (ref 135–145)
Total Bilirubin: 1.7 mg/dL — ABNORMAL HIGH (ref 0.2–1.2)
Total Protein: 8.2 g/dL (ref 6.0–8.3)

## 2014-08-18 LAB — MAGNESIUM: MAGNESIUM: 1.2 mg/dL — AB (ref 1.5–2.5)

## 2014-08-18 LAB — CBC WITH DIFFERENTIAL/PLATELET
BASOS PCT: 0.6 % (ref 0.0–3.0)
Basophils Absolute: 0 10*3/uL (ref 0.0–0.1)
EOS ABS: 0.1 10*3/uL (ref 0.0–0.7)
Eosinophils Relative: 2.7 % (ref 0.0–5.0)
HCT: 32.4 % — ABNORMAL LOW (ref 39.0–52.0)
Hemoglobin: 10.9 g/dL — ABNORMAL LOW (ref 13.0–17.0)
Lymphocytes Relative: 31.5 % (ref 12.0–46.0)
Lymphs Abs: 1.5 10*3/uL (ref 0.7–4.0)
MCHC: 33.5 g/dL (ref 30.0–36.0)
MCV: 104.8 fl — AB (ref 78.0–100.0)
Monocytes Absolute: 0.4 10*3/uL (ref 0.1–1.0)
Monocytes Relative: 8.8 % (ref 3.0–12.0)
Neutro Abs: 2.6 10*3/uL (ref 1.4–7.7)
Neutrophils Relative %: 56.4 % (ref 43.0–77.0)
PLATELETS: 129 10*3/uL — AB (ref 150.0–400.0)
RBC: 3.09 Mil/uL — AB (ref 4.22–5.81)
RDW: 15.5 % (ref 11.5–15.5)
WBC: 4.7 10*3/uL (ref 4.0–10.5)

## 2014-08-18 LAB — GAMMA GT: GGT: 2170 U/L — AB (ref 7–51)

## 2014-08-18 MED ORDER — AMLODIPINE BESYLATE 2.5 MG PO TABS: 2.5000 mg | ORAL_TABLET | Freq: Every day | ORAL | Status: DC

## 2014-08-18 MED ORDER — MAGNESIUM 30 MG PO TABS: 30.0000 mg | ORAL_TABLET | Freq: Two times a day (BID) | ORAL | Status: DC

## 2014-08-18 NOTE — Progress Notes (Signed)
Pre visit review using our clinic review tool, if applicable. No additional management support is needed unless otherwise documented below in the visit note. 

## 2014-08-18 NOTE — Patient Instructions (Addendum)
Alcoholic cirrhosis Pt has stopped drinking any alcohol. Encouraged to continue abstinence.  Repeat cmp, cbc, ggt.  Essential hypertension At home readings very good. So will stary on lopressor and amlodipine.    Low magnesium. Recheck today.  Rx magnesium today.  Currently no diabetic meds based on recent results.  Recheck k level today and see if needs further k supplementation.  Follow up in 1 month or as needed.  Possibly sooner depending on lab results.

## 2014-08-18 NOTE — Assessment & Plan Note (Signed)
At home readings very good. So will stary on lopressor and amlodipine.

## 2014-08-18 NOTE — Progress Notes (Signed)
Subjective:    Patient ID: Arthur Woodard, male    DOB: [birth year], 54 y.o.   MRN: [id number]  HPI  Pt ggt was elevated, platlets was low and some lft elevation.  Pt low magnesium on labs and mild anemia. Anemia was better. Pt is on folic acid.   No nauseau ,no vomiting, no nose bleeds, no bruising, no black stools, no bloody stools and no cramping.  Pt states no alcohol since I last saw him. Pt states keeping himself busy.  Pt does attend church. [employer].   Pt denies any history of depression. Pt states has frustration over his career. Pt has BS and MS Copy. Finished degree in 2005.   Htn. No cardio or neruo signs and symptoms. Pt bp readings 126/84, 135/82, 133/80.    Review of Systems  Constitutional: Negative for fever, chills, diaphoresis, activity change and fatigue.  Respiratory: Negative for cough, chest tightness and shortness of breath.   Cardiovascular: Negative for chest pain, palpitations and leg swelling.  Gastrointestinal: Negative for nausea, vomiting and abdominal pain.  Musculoskeletal: Negative for neck pain and neck stiffness.  Neurological: Negative for dizziness, tremors, seizures, syncope, facial asymmetry, speech difficulty, weakness, light-headedness, numbness and headaches.  Psychiatric/Behavioral: Negative for behavioral problems, confusion and agitation. The patient is not nervous/anxious.    Past Medical History  Diagnosis Date  . Cirrhosis   . Splenomegaly, congestive, chronic   . Thrombocytopenia   . Liver nodule 05/15/2011  . Allergy   . Anemia   . Diabetes mellitus   . Hypertension   . Liver lesion   . Helicobacter pylori gastritis     severe  . Tubular adenoma of colon   . Sleep apnea   . Chronic kidney disease     History   Social History  . Marital Status: Married    Spouse Name: N/A  . Number of Children: 2  . Years of Education: N/A   Occupational History  . unemployed    Social History Main  Topics  . Smoking status: Never Smoker   . Smokeless tobacco: Never Used  . Alcohol Use: No  . Drug Use: No  . Sexual Activity: No   Other Topics Concern  . Not on file   Social History Narrative    Past Surgical History  Procedure Laterality Date  . Tooth extraction    . Bone marrow biopsy      Family History  Problem Relation Age of Onset  . Colon cancer Neg Hx   . Esophageal cancer Neg Hx   . Rectal cancer Neg Hx   . Stomach cancer Neg Hx     No Known Allergies  Current Outpatient Prescriptions on File Prior to Visit  Medication Sig Dispense Refill  . allopurinol (ZYLOPRIM) 300 MG tablet Take 300 mg by mouth daily.    Marland Kitchen amLODipine (NORVASC) 2.5 MG tablet Take 1 tablet (2.5 mg total) by mouth daily. 30 tablet 0  . cetirizine (ZYRTEC) 10 MG tablet Take 10 mg by mouth daily as needed for allergies.     Marland Kitchen FOLIC ACID PO Take 1 tablet by mouth daily. Pt takes 400 mcg daily    . Iron TABS Take 1 tablet by mouth daily.    . metoprolol tartrate (LOPRESSOR) 25 MG tablet Take 0.5 tablets (12.5 mg total) by mouth 2 (two) times daily. 60 tablet 3  . potassium chloride 20 MEQ TBCR Take 20 mEq by mouth daily. 5 tablet 0   No  current facility-administered medications on file prior to visit.    BP 154/82 mmHg  Pulse 74  Temp(Src) 98.7 F (37.1 C) (Oral)  Ht 5' 3"  (1.6 m)  Wt 132 lb 9.6 oz (60.147 kg)  BMI 23.49 kg/m2  SpO2 100%        Objective:   Physical Exam  General Mental Status- Alert. General Appearance- Not in acute distress.   Skin General: Color- Normal Color. Moisture- Normal Moisture.  Neck Carotid Arteries- Normal color. Moisture- Normal Moisture. No carotid bruits. No JVD.  Chest and Lung Exam Auscultation: Breath Sounds:-Normal.  Cardiovascular Auscultation:Rythm- Regular. Murmurs & Other Heart Sounds:Auscultation of the heart reveals- No Murmurs.   Neurologic Cranial Nerve exam:- CN III-XII intact(No nystagmus), symmetric smile. Drift  Test:- No drift. Romberg Exam:- Negative.  Heal to Toe Gait exam:-Normal. Finger to Nose:- Normal/Intact Strength:- 5/5 equal and symmetric strength both upper and lower extremities.   Abdomen Inspection:-Inspection Normal.  Palpation/Perucssion: Palpation and Percussion of the abdomen reveal- Non Tender, No Rebound tenderness, No rigidity(Guarding) and No Palpable abdominal masses.  Liver:-Normal.  Spleen:- Normal.         Assessment & Plan:

## 2014-08-18 NOTE — Assessment & Plan Note (Addendum)
Pt has stopped drinking any alcohol. Encouraged to continue abstinence.  Repeat cmp, cbc, ggt.

## 2014-08-22 ENCOUNTER — Telehealth: Payer: Self-pay | Admitting: Medical

## 2014-08-22 DIAGNOSIS — K7030 Alcoholic cirrhosis of liver without ascites: Secondary | ICD-10-CM

## 2014-08-22 NOTE — Telephone Encounter (Signed)
[place] date. Message to referral staff. See if can be done on Thursday am when he gets his labs.

## 2014-08-22 NOTE — Telephone Encounter (Signed)
Advise magnesium level. Start mag I called in.

## 2014-08-23 NOTE — Telephone Encounter (Signed)
Left a message for call back.  

## 2014-08-25 NOTE — Telephone Encounter (Signed)
Patient coming in for [place] on 08/28/13 then to lab afterwards.

## 2014-08-29 ENCOUNTER — Other Ambulatory Visit: Payer: 59

## 2014-08-29 ENCOUNTER — Ambulatory Visit (HOSPITAL_BASED_OUTPATIENT_CLINIC_OR_DEPARTMENT_OTHER)
Admission: RE | Admit: 2014-08-29 | Discharge: 2014-08-29 | Disposition: A | Discharge status: Home / Self Care | Payer: 59 | Source: Ambulatory Visit | Attending: Medical | Admitting: Medical

## 2014-08-29 DIAGNOSIS — R222 Localized swelling, mass and lump, trunk: Secondary | ICD-10-CM | POA: Diagnosis not present

## 2014-08-29 DIAGNOSIS — K7460 Unspecified cirrhosis of liver: Secondary | ICD-10-CM | POA: Diagnosis present

## 2014-08-29 DIAGNOSIS — N281 Cyst of kidney, acquired: Secondary | ICD-10-CM | POA: Diagnosis not present

## 2014-08-29 DIAGNOSIS — K7030 Alcoholic cirrhosis of liver without ascites: Secondary | ICD-10-CM

## 2014-08-29 DIAGNOSIS — R188 Other ascites: Secondary | ICD-10-CM | POA: Insufficient documentation

## 2014-08-30 ENCOUNTER — Telehealth: Payer: Self-pay | Admitting: Medical

## 2014-08-30 ENCOUNTER — Inpatient Hospital Stay: Payer: 59 | Admitting: Adult Health

## 2014-08-30 DIAGNOSIS — K7031 Alcoholic cirrhosis of liver with ascites: Secondary | ICD-10-CM

## 2014-08-30 NOTE — Telephone Encounter (Signed)
Refer to GI 

## 2014-09-19 ENCOUNTER — Ambulatory Visit (INDEPENDENT_AMBULATORY_CARE_PROVIDER_SITE_OTHER): Payer: 59 | Admitting: Medical

## 2014-09-19 ENCOUNTER — Encounter: Payer: Self-pay | Admitting: Medical

## 2014-09-19 VITALS — BP 139/80 | HR 74 | Temp 98.7°F | Ht 63.0 in | Wt 135.4 lb

## 2014-09-19 DIAGNOSIS — K7031 Alcoholic cirrhosis of liver with ascites: Secondary | ICD-10-CM

## 2014-09-19 DIAGNOSIS — I10 Essential (primary) hypertension: Secondary | ICD-10-CM | POA: Diagnosis not present

## 2014-09-19 LAB — MAGNESIUM: Magnesium: 1.4 mg/dL — ABNORMAL LOW (ref 1.5–2.5)

## 2014-09-19 NOTE — Assessment & Plan Note (Signed)
Continue current bp meds. If in September bp still borderlline. Will likely increase metoprolol  dose.

## 2014-09-19 NOTE — Progress Notes (Signed)
Subjective:    Patient ID: Arthur Woodard, male    DOB: 13-Dec-1960, 54 y.o.   MRN: [id number]  HPI   Pt in for follow up. He never got repeat labs. Pt US showed some cirrhosis. Also some ascites. Small perihepatic accites. Pt ggt from 08-10-2014 from the 08-18-2014 was increased. Pt has not stopped alcohol  completely. Pt states only drinking 2 shots of vodka only on Thursdays per his report.  Pt still anemic but  Improved from a month ago. His platlets have improved. On 08-06-2014 was 59 platlets  on 08-17-2104 was 129 plattlets.  Pt does have appointment with GI on September.     Review of Systems  Constitutional: Negative for fever, chills, diaphoresis, activity change and fatigue.  Respiratory: Negative for cough, chest tightness and shortness of breath.   Cardiovascular: Negative for chest pain, palpitations and leg swelling.  Gastrointestinal: Negative for nausea, vomiting, abdominal pain and abdominal distention.  Musculoskeletal: Negative for neck pain and neck stiffness.  Neurological: Negative for dizziness, tremors, seizures, syncope, facial asymmetry, speech difficulty, weakness, light-headedness, numbness and headaches.  Psychiatric/Behavioral: Negative for behavioral problems, confusion and agitation. The patient is not nervous/anxious.       Past Medical History  Diagnosis Date  . Cirrhosis   . Splenomegaly, congestive, chronic   . Thrombocytopenia   . Liver nodule 05/15/2011  . Allergy   . Anemia   . Diabetes mellitus   . Hypertension   . Liver lesion   . Helicobacter pylori gastritis     severe  . Tubular adenoma of colon   . Sleep apnea   . Chronic kidney disease     History   Social History  . Marital Status: Married    Spouse Name: N/A  . Number of Children: 2  . Years of Education: N/A   Occupational History  . unemployed    Social History Main Topics  . Smoking status: Never Smoker   . Smokeless tobacco: Never Used  . Alcohol Use: No    . Drug Use: No  . Sexual Activity: No   Other Topics Concern  . Not on file   Social History Narrative    Past Surgical History  Procedure Laterality Date  . Tooth extraction    . Bone marrow biopsy      Family History  Problem Relation Age of Onset  . Colon cancer Neg Hx   . Esophageal cancer Neg Hx   . Rectal cancer Neg Hx   . Stomach cancer Neg Hx     No Known Allergies  Current Outpatient Prescriptions on File Prior to Visit  Medication Sig Dispense Refill  . allopurinol (ZYLOPRIM) 300 MG tablet Take 300 mg by mouth daily.    Marland Kitchen amLODipine (NORVASC) 2.5 MG tablet Take 1 tablet (2.5 mg total) by mouth daily. 30 tablet 3  . cetirizine (ZYRTEC) 10 MG tablet Take 10 mg by mouth daily as needed for allergies.     Marland Kitchen FOLIC ACID PO Take 1 tablet by mouth daily. Pt takes 400 mcg daily    . magnesium 30 MG tablet Take 1 tablet (30 mg total) by mouth 2 (two) times daily. 60 tablet 0  . metoprolol tartrate (LOPRESSOR) 25 MG tablet Take 0.5 tablets (12.5 mg total) by mouth 2 (two) times daily. 60 tablet 3  . Iron TABS Take 1 tablet by mouth daily.    . potassium chloride 20 MEQ TBCR Take 20 mEq by mouth daily. 5 tablet 0  No current facility-administered medications on file prior to visit.    BP 139/80 mmHg  Pulse 74  Temp(Src) 98.7 F (37.1 C) (Oral)  Ht 5' 3"  (1.6 m)  Wt 135 lb 6.4 oz (61.417 kg)  BMI 23.99 kg/m2  SpO2 98%       Objective:   Physical Exam  General Appearance- Not in acute distress.  HEENT Eyes- Scleraeral/Conjuntiva-bilat- Not Yellow. Mouth & Throat- Normal.  Chest and Lung Exam Auscultation: Breath sounds:-Normal. Adventitious sounds:- No Adventitious sounds.  Cardiovascular Auscultation:Rythm - Regular. Heart Sounds -Normal heart sounds.  Abdomen Inspection:-Inspection Normal.  Palpation/Perucssion: Palpation and Percussion of the abdomen reveal- Non Tender, No Rebound tenderness, No rigidity(Guarding) and No Palpable abdominal  masses.  Liver:-Normal.  Spleen:- Normal.   Back - no cva tenderness.       Assessment & Plan:

## 2014-09-19 NOTE — Assessment & Plan Note (Addendum)
Stressed to stop drinking alcohol. Pt expressed understanding on damage done and how could efffect if continues. Keep appointment with [facility] in September. Has some mild ascites on [place] report.

## 2014-09-19 NOTE — Assessment & Plan Note (Addendum)
Advised to get otc tabs and use 1 tab every other day for next 2 weeks Only found 120 mg. Rechecking level today.  He could not find 30 mg tabs which were instructed to use bid. So will use higher dose but every other day.

## 2014-09-19 NOTE — Progress Notes (Signed)
Pre visit review using our clinic review tool, if applicable. No additional management support is needed unless otherwise documented below in the visit note. 

## 2014-09-19 NOTE — Patient Instructions (Addendum)
Alcoholic cirrhosis Stressed to stop drinking alcohol. Pt expressed understanding on damage done and how could efffect if continues. Keep appointment with [facility] in September. Has some mild ascites on [place] report.  Hypomagnesemia Advised to get otc tabs and use 1 tab every other day for next 2 weeks Only found 120 mg. Rechecking level today.  He could not find 30 mg tabs which were instructed to use bid. So will use higher dose but every other day.  Essential hypertension Continue current bp meds. If in September bp still borderlline. Will likely increase metoprolol  dose.   Get future labs done which were placed in computer.  Follow up end of September or as needed.

## 2014-09-20 ENCOUNTER — Other Ambulatory Visit (INDEPENDENT_AMBULATORY_CARE_PROVIDER_SITE_OTHER): Payer: 59

## 2014-09-20 DIAGNOSIS — K7030 Alcoholic cirrhosis of liver without ascites: Secondary | ICD-10-CM

## 2014-09-20 LAB — COMPREHENSIVE METABOLIC PANEL
ALT: 73 U/L — ABNORMAL HIGH (ref 0–53)
AST: 138 U/L — AB (ref 0–37)
Albumin: 3.8 g/dL (ref 3.5–5.2)
Alkaline Phosphatase: 263 U/L — ABNORMAL HIGH (ref 39–117)
BUN: 15 mg/dL (ref 6–23)
CALCIUM: 9.5 mg/dL (ref 8.4–10.5)
CHLORIDE: 101 meq/L (ref 96–112)
CO2: 26 meq/L (ref 19–32)
CREATININE: 0.71 mg/dL (ref 0.40–1.50)
GFR: 149.03 mL/min (ref >60.00)
GLUCOSE: 172 mg/dL — AB (ref 70–99)
Potassium: 3.9 mEq/L (ref 3.5–5.1)
Sodium: 137 mEq/L (ref 135–145)
TOTAL PROTEIN: 8 g/dL (ref 6.0–8.3)
Total Bilirubin: 2.3 mg/dL — ABNORMAL HIGH (ref 0.2–1.2)

## 2014-09-20 LAB — AMYLASE: AMYLASE: 91 U/L (ref 27–131)

## 2014-09-20 LAB — GAMMA GT: GGT: 2154 U/L — ABNORMAL HIGH (ref 7–51)

## 2014-09-20 LAB — LIPASE: Lipase: 23 U/L (ref 11.0–59.0)

## 2014-09-22 ENCOUNTER — Other Ambulatory Visit: Payer: 59

## 2014-09-23 ENCOUNTER — Inpatient Hospital Stay: Payer: 59 | Admitting: Adult Health

## 2014-10-19 ENCOUNTER — Telehealth: Payer: Self-pay

## 2014-10-19 ENCOUNTER — Telehealth: Payer: Self-pay | Admitting: Medical

## 2014-10-19 MED ORDER — METOPROLOL TARTRATE 25 MG PO TABS: 12.5000 mg | ORAL_TABLET | Freq: Two times a day (BID) | ORAL | Status: DC

## 2014-10-19 NOTE — Telephone Encounter (Signed)
I checked and pt does have upcoming appointment with GI.

## 2014-10-19 NOTE — Telephone Encounter (Signed)
[facility] is requesting refills on metoprolol tartrate 25mg , #30, Pt takes 1/2 tablet twice a day. Please advise.

## 2014-10-19 NOTE — Telephone Encounter (Signed)
Refilled his metoprolol.

## 2014-11-02 ENCOUNTER — Ambulatory Visit (INDEPENDENT_AMBULATORY_CARE_PROVIDER_SITE_OTHER): Payer: 59 | Admitting: Internal Medicine

## 2014-11-02 ENCOUNTER — Encounter: Payer: Self-pay | Admitting: Internal Medicine

## 2014-11-02 ENCOUNTER — Other Ambulatory Visit (INDEPENDENT_AMBULATORY_CARE_PROVIDER_SITE_OTHER): Payer: 59

## 2014-11-02 VITALS — BP 120/74 | HR 84 | Ht 63.0 in | Wt 136.0 lb

## 2014-11-02 DIAGNOSIS — K7689 Other specified diseases of liver: Secondary | ICD-10-CM | POA: Diagnosis not present

## 2014-11-02 DIAGNOSIS — K7030 Alcoholic cirrhosis of liver without ascites: Secondary | ICD-10-CM

## 2014-11-02 DIAGNOSIS — Z86010 Personal history of colonic polyps: Secondary | ICD-10-CM

## 2014-11-02 DIAGNOSIS — D7589 Other specified diseases of blood and blood-forming organs: Secondary | ICD-10-CM | POA: Diagnosis not present

## 2014-11-02 DIAGNOSIS — K769 Liver disease, unspecified: Secondary | ICD-10-CM

## 2014-11-02 LAB — COMPREHENSIVE METABOLIC PANEL
ALK PHOS: 265 U/L — AB (ref 39–117)
ALT: 96 U/L — ABNORMAL HIGH (ref 0–53)
AST: 234 U/L — AB (ref 0–37)
Albumin: 3.8 g/dL (ref 3.5–5.2)
BUN: 16 mg/dL (ref 6–23)
CHLORIDE: 104 meq/L (ref 96–112)
CO2: 29 mEq/L (ref 19–32)
Calcium: 9.1 mg/dL (ref 8.4–10.5)
Creatinine, Ser: 0.81 mg/dL (ref 0.40–1.50)
GFR: 127.95 mL/min (ref >60.00)
GLUCOSE: 100 mg/dL — AB (ref 70–99)
POTASSIUM: 4.2 meq/L (ref 3.5–5.1)
SODIUM: 141 meq/L (ref 135–145)
TOTAL PROTEIN: 8.1 g/dL (ref 6.0–8.3)
Total Bilirubin: 2.2 mg/dL — ABNORMAL HIGH (ref 0.2–1.2)

## 2014-11-02 LAB — CBC WITH DIFFERENTIAL/PLATELET
BASOS ABS: 0 10*3/uL (ref 0.0–0.1)
BASOS PCT: 0.7 % (ref 0.0–3.0)
EOS ABS: 0.2 10*3/uL (ref 0.0–0.7)
Eosinophils Relative: 3.5 % (ref 0.0–5.0)
HEMATOCRIT: 35.8 % — AB (ref 39.0–52.0)
HEMOGLOBIN: 12.2 g/dL — AB (ref 13.0–17.0)
LYMPHS PCT: 37.4 % (ref 12.0–46.0)
Lymphs Abs: 1.6 10*3/uL (ref 0.7–4.0)
MCHC: 34.2 g/dL (ref 30.0–36.0)
MCV: 101.9 fl — ABNORMAL HIGH (ref 78.0–100.0)
MONO ABS: 0.7 10*3/uL (ref 0.1–1.0)
Monocytes Relative: 16.2 % — ABNORMAL HIGH (ref 3.0–12.0)
Neutro Abs: 1.8 10*3/uL (ref 1.4–7.7)
Neutrophils Relative %: 42.2 % — ABNORMAL LOW (ref 43.0–77.0)
Platelets: 88 10*3/uL — ABNORMAL LOW (ref 150.0–400.0)
RBC: 3.51 Mil/uL — ABNORMAL LOW (ref 4.22–5.81)
RDW: 15.5 % (ref 11.5–15.5)
WBC: 4.4 10*3/uL (ref 4.0–10.5)

## 2014-11-02 LAB — PROTIME-INR
INR: 1.3 ratio — AB (ref 0.8–1.0)
PROTHROMBIN TIME: 14.4 s — AB (ref 9.6–13.1)

## 2014-11-02 LAB — IBC PANEL
Iron: 197 ug/dL — ABNORMAL HIGH (ref 42–165)
SATURATION RATIOS: 50.1 % — AB (ref 20.0–50.0)
Transferrin: 281 mg/dL (ref 212.0–360.0)

## 2014-11-02 MED ORDER — NA SULFATE-K SULFATE-MG SULF 17.5-3.13-1.6 GM/177ML PO SOLN: ORAL | Status: DC

## 2014-11-02 NOTE — Patient Instructions (Addendum)
You have been scheduled for an endoscopy and colonoscopy. Please follow the written instructions given to you at your visit today. Please pick up your prep supplies at the pharmacy within the next 1-3 days. If you use inhalers (even only as needed), please bring them with you on the day of your procedure. Your physician has requested that you go to www.startemmi.com and enter the access code given to you at your visit today. This web site gives a general overview about your procedure. However, you should still follow specific instructions given to you by our office regarding your preparation for the procedure.  You have been scheduled for an MRI liver at [facility] (1st floor) on Friday, 11/11/14. Your appointment time is 8:00 am. Please arrive 15 minutes prior to your appointment time for registration purposes. Please make certain not to have anything to eat or drink 6 hours prior to your test. In addition, if you have any metal in your body, have a pacemaker or defibrillator, please be sure to let your ordering physician know. This test typically takes 45 minutes to 1 hour to complete.  Please follow up with Dr Hilarie Fredrickson in 1 year.  Your physician has requested that you go to the basement for the following lab work before leaving today: CBC, CMP, INR, B12, Folate, IBC

## 2014-11-02 NOTE — Progress Notes (Signed)
Subjective:    Patient ID: Arthur Woodard, male    DOB: 26-Apr-1960, 54 y.o.   MRN: [id number]  HPI Arthur Woodard is a 54 yo male with PMH of alcoholic cirrhosis with portal hypertension previously with ascites, thrombocytopenia and splenomegaly, liver lesions of uncertain significance who is seen for follow-up. He was last seen in November 2015. Also with history of diabetes, hypertension, adenomatous colon polyps and H. Pylori.  He reports he is doing well. He does admit to drinking alcohol 2 or 3 days per month. When he drinks he drinks vodka usually in shots no more than 3 on any given day. Denies abdominal pain. Is working at [employer]. Denies increasing abdominal girth or lower extremity edema. No bleeding. Reports normal bowel movements without blood in his stool or melena. Denies nausea or vomiting. Reports stable weight. Is taking iron and folic acid. Denies jaundice or acholic stools. No dysphagia or odynophagia.  Reports having developed a metabolic acidosis and his metformin was stopped.  Review of Systems As per history of present illness, otherwise negative  Current Medications, Allergies, Past Medical History, Past Surgical History, Family History and Social History were reviewed in [employer] record.     Objective:   Physical Exam BP 120/74 mmHg  Pulse 84  Ht 5\' 3"  (1.6 m)  Wt 136 lb (61.689 kg)  BMI 24.10 kg/m2 Constitutional: Well-developed and well-nourished. No distress. HEENT: Normocephalic and atraumatic. Oropharynx is clear and moist. No oropharyngeal exudate. Conjunctivae are normal.  No scleral icterus. Neck: Neck supple. Trachea midline. Cardiovascular: Normal rate, regular rhythm and intact distal pulses. No M/R/G Pulmonary/chest: Effort normal and breath sounds normal. No wheezing, rales or rhonchi. Abdominal: Soft, nontender, nondistended. Bowel sounds active throughout. No definite ascites Extremities: no clubbing, cyanosis, or  edema Lymphadenopathy: No cervical adenopathy noted. Neurological: Alert and oriented to person place and time. No asterixis Skin: Skin is warm and dry. No rashes noted. Psychiatric: Normal mood and affect. Behavior is normal.   CBC    Component Value Date/Time   WBC 4.7 08/18/2014 1026   WBC 3.5* 11/15/2011 0926   RBC 3.09* 08/18/2014 1026   RBC 2.84* 07/01/2012 1703   RBC 3.49* 11/15/2011 0926   HGB 10.9* 08/18/2014 1026   HGB 12.6* 11/15/2011 0926   HCT 32.4* 08/18/2014 1026   HCT 36.0* 11/15/2011 0926   PLT 129.0* 08/18/2014 1026   PLT 69* 11/15/2011 0926   MCV 104.8* 08/18/2014 1026   MCV 103.2* 11/15/2011 0926   MCH 34.9* 08/06/2014 0614   MCH 36.0* 11/15/2011 0926   MCHC 33.5 08/18/2014 1026   MCHC 34.9 11/15/2011 0926   RDW 15.5 08/18/2014 1026   RDW 13.6 11/15/2011 0926   LYMPHSABS 1.5 08/18/2014 1026   LYMPHSABS 1.2 11/15/2011 0926   MONOABS 0.4 08/18/2014 1026   MONOABS 0.6 11/15/2011 0926   EOSABS 0.1 08/18/2014 1026   EOSABS 0.2 11/15/2011 0926   BASOSABS 0.0 08/18/2014 1026   BASOSABS 0.0 11/15/2011 0926    CMP     Component Value Date/Time   NA 137 09/20/2014 1617   NA 137 11/15/2011 0926   K 3.9 09/20/2014 1617   K 4.5 11/15/2011 0926   CL 101 09/20/2014 1617   CL 106 11/15/2011 0926   CO2 26 09/20/2014 1617   CO2 22 11/15/2011 0926   GLUCOSE 172* 09/20/2014 1617   GLUCOSE 120* 11/15/2011 0926   GLUCOSE 116* 02/10/2006 1102   BUN 15 09/20/2014 1617   BUN 15.0  11/15/2011 0926   CREATININE 0.71 09/20/2014 1617   CREATININE 0.9 11/15/2011 0926   CALCIUM 9.5 09/20/2014 1617   CALCIUM 8.4 11/15/2011 0926   PROT 8.0 09/20/2014 1617   PROT 7.2 11/15/2011 0926   ALBUMIN 3.8 09/20/2014 1617   ALBUMIN 2.7* 11/15/2011 0926   AST 138* 09/20/2014 1617   AST 135* 11/15/2011 0926   ALT 73* 09/20/2014 1617   ALT 92* 11/15/2011 0926   ALKPHOS 263* 09/20/2014 1617   ALKPHOS 342* 11/15/2011 0926   BILITOT 2.3* 09/20/2014 1617   BILITOT 1.30*  11/15/2011 0926   GFRNONAA 30* 08/06/2014 0614   GFRAA 35* 08/06/2014 0614    Lab Results  Component Value Date   INR 1.56* 08/04/2014   INR 1.2* 01/03/2014   INR 1.3* 01/28/2013   ULTRASOUND ABDOMEN COMPLETE   COMPARISON:  MRI 01/14/2014   FINDINGS: Gallbladder: No gallstones or wall thickening visualized. No sonographic Murphy sign noted.   Common bile duct: Diameter: 2 mm in diameter within normal limits.   Liver: Again noted heterogeneous increased echogenicity of the liver. Again noted subcentimeter liver nodules. The largest nodule in left hepatic lobe measures 9 x 8 mm. Nodule in left hepatic lobe measures 8 x 6 mm. Nodule in right hepatic measures 6 x 7 mm.   IVC: No abnormality visualized.   Pancreas: Limited visualization due to bowel gas   Spleen: Size and appearance within normal limits. Spleen measures 7.8 cm in length.   Right Kidney: Length: 10.3 cm. No hydronephrosis. A lower pole cyst measures 9 mm by 10 mm   Left Kidney: Length: 9.5 cm. Echogenicity within normal limits. No mass or hydronephrosis visualized.   Abdominal aorta: No aneurysm visualized. Measures up to 2.5 cm in diameter.   Other findings: Small perihepatic ascites. Question recannulized paraumbilical vein.   IMPRESSION: 1. No gallstones are noted within gallbladder.  Normal CBD. 2. Again noted heterogeneous increased echogenicity of the liver with multiple subcentimeter hypoechoic nodules. This is consistent with cirrhosis. No intrahepatic biliary ductal dilatation. 3. No splenomegaly. 4. No hydronephrosis.  Right renal cyst measures 10 mm. 5. Small perihepatic ascites.     Electronically Signed   By: Lahoma Crocker M.D.   On: 08/29/2014 10:09         Result Notes       Notes Recorded by Bunnie Domino, LPN on 7/67/2094 at 70:96 AM Left another message for patient to return call. ------  Notes Recorded by Bunnie Domino, LPN on 2/83/6629 at 4:76 AM Left message for patient  to return my call. ------  Notes Recorded by Mackie Pai, PA-C on 08/30/2014 at 8:25 AM Pt has cirrhosis by [place] and some mild ascites(fluid) around liver. I was reviewing his chart. Pt has seen GI in the past. With his condition. Good idea to see GI again.              Vitals       Height Weight BMI (Calculated)      5\' 3"  (1.6 m) 135 lb 6.4 oz (61.417 kg) 24         Interpretation Summary       CLINICAL DATA:  Cirrhosis, splenomegaly, elevated LFTs   EXAM: ULTRASOUND ABDOMEN COMPLETE   COMPARISON:  MRI 01/14/2014   FINDINGS: Gallbladder: No gallstones or wall thickening visualized. No sonographic Murphy sign noted.   Common bile duct: Diameter: 2 mm in diameter within normal limits.   Liver: Again noted heterogeneous increased echogenicity of the  liver. Again noted subcentimeter liver nodules. The largest nodule in left hepatic lobe measures 9 x 8 mm. Nodule in left hepatic lobe measures 8 x 6 mm. Nodule in right hepatic measures 6 x 7 mm.   IVC: No abnormality visualized.   Pancreas: Limited visualization due to bowel gas   Spleen: Size and appearance within normal limits. Spleen measures 7.8 cm in length.   Right Kidney: Length: 10.3 cm. No hydronephrosis. A lower pole cyst measures 9 mm by 10 mm   Left Kidney: Length: 9.5 cm. Echogenicity within normal limits. No mass or hydronephrosis visualized.   Abdominal aorta: No aneurysm visualized. Measures up to 2.5 cm in diameter.   Other findings: Small perihepatic ascites. Question recannulized paraumbilical vein.   IMPRESSION: 1. No gallstones are noted within gallbladder.  Normal CBD. 2. Again noted heterogeneous increased echogenicity of the liver with multiple subcentimeter hypoechoic nodules. This is consistent with cirrhosis. No intrahepatic biliary ductal dilatation. 3. No splenomegaly. 4. No hydronephrosis.  Right renal cyst measures 10 mm. 5. Small perihepatic ascites.     Electronically Signed    By: Lahoma Crocker M.D.   On: 08/29/2014 10:09        Assessment & Plan:  54 yo male with PMH of alcoholic cirrhosis with portal hypertension previously with ascites, thrombocytopenia and splenomegaly, liver lesions of uncertain significance who is seen for follow-up.  1. Alcoholic cirrhosis -- unfortunately he has not been totally abstinent from alcohol. He is drinking a couple of days per month. We discussed this at length today and I have again stressed the importance of total cessation given his advanced liver disease. He reports he is very willing to try to totally avoid alcohol. No appreciable ascites nor any seen on recent ultrasound (perihepatic only). Need to follow-up previously seen liver lesions for [facility] screening with MRI and also rescreen for varices --No alcohol --Low sodium diet --EGD for variceal screening, risk benefits and alternatives discussed, he is agreeable to proceed --Flu vaccine --CBC, CMP and INR today  2. Liver lesions -- at risk for [place]. MRI done in November, repeat in November 2016 rule out [facility] and progression in size  3. History of adenomatous colon polyps -- last colonoscopy 2013, repeat at this time. Risk benefits alternatives discussed he is agreeable to proceed.  4. Macrocytosis -- no alcohol, check B12 and folate again today  12 month follow-up, sooner if necessary, though may change this depending on imaging and endoscopies

## 2014-11-03 LAB — FOLATE: FOLATE: 7.9 ng/mL (ref >5.9)

## 2014-11-03 LAB — VITAMIN B12: Vitamin B-12: 783 pg/mL (ref 211–911)

## 2014-11-07 ENCOUNTER — Telehealth: Payer: Self-pay | Admitting: Medical

## 2014-11-07 NOTE — Telephone Encounter (Signed)
Insurance Josem Kaufmann #[id number], given to GI.

## 2014-11-07 NOTE — Telephone Encounter (Signed)
Pt states he talked to GI and to [facility] and he needs [place] to get auth/ref for 11/02/14 visit he had at [facility] or they won't pay. Please f/u with GI to prov auth #.

## 2014-11-11 ENCOUNTER — Ambulatory Visit (HOSPITAL_COMMUNITY)
Admission: RE | Admit: 2014-11-11 | Discharge: 2014-11-11 | Disposition: A | Discharge status: Home / Self Care | Payer: 59 | Source: Ambulatory Visit | Attending: Internal Medicine | Admitting: Internal Medicine

## 2014-11-11 DIAGNOSIS — I864 Gastric varices: Secondary | ICD-10-CM | POA: Diagnosis not present

## 2014-11-11 DIAGNOSIS — K7030 Alcoholic cirrhosis of liver without ascites: Secondary | ICD-10-CM | POA: Diagnosis not present

## 2014-11-11 DIAGNOSIS — K7689 Other specified diseases of liver: Secondary | ICD-10-CM | POA: Insufficient documentation

## 2014-11-11 DIAGNOSIS — Z86010 Personal history of colonic polyps: Secondary | ICD-10-CM | POA: Insufficient documentation

## 2014-11-11 DIAGNOSIS — K769 Liver disease, unspecified: Secondary | ICD-10-CM

## 2014-11-11 DIAGNOSIS — I8500 Esophageal varices without bleeding: Secondary | ICD-10-CM | POA: Insufficient documentation

## 2014-11-11 MED ORDER — GADOXETATE DISODIUM 0.25 MMOL/ML IV SOLN
10.0000 mL | Freq: Once | INTRAVENOUS | Status: AC | PRN
  Administered 2014-11-11: 6 mL via INTRAVENOUS

## 2014-11-18 ENCOUNTER — Encounter: Payer: Self-pay | Admitting: Medical

## 2014-11-18 ENCOUNTER — Ambulatory Visit (INDEPENDENT_AMBULATORY_CARE_PROVIDER_SITE_OTHER): Payer: 59 | Admitting: Medical

## 2014-11-18 VITALS — BP 124/76 | HR 72 | Temp 98.0°F | Resp 16 | Ht 63.0 in | Wt 135.2 lb

## 2014-11-18 DIAGNOSIS — Z8639 Personal history of other endocrine, nutritional and metabolic disease: Secondary | ICD-10-CM

## 2014-11-18 DIAGNOSIS — K7030 Alcoholic cirrhosis of liver without ascites: Secondary | ICD-10-CM | POA: Diagnosis not present

## 2014-11-18 DIAGNOSIS — I10 Essential (primary) hypertension: Secondary | ICD-10-CM

## 2014-11-18 LAB — CBC WITH DIFFERENTIAL/PLATELET
BASOS ABS: 0 10*3/uL (ref 0.0–0.1)
BASOS PCT: 0.5 % (ref 0.0–3.0)
EOS ABS: 0.1 10*3/uL (ref 0.0–0.7)
Eosinophils Relative: 3.3 % (ref 0.0–5.0)
HCT: 35.9 % — ABNORMAL LOW (ref 39.0–52.0)
Hemoglobin: 12.3 g/dL — ABNORMAL LOW (ref 13.0–17.0)
LYMPHS ABS: 1.1 10*3/uL (ref 0.7–4.0)
Lymphocytes Relative: 26.2 % (ref 12.0–46.0)
MCHC: 34.3 g/dL (ref 30.0–36.0)
MCV: 102 fl — ABNORMAL HIGH (ref 78.0–100.0)
MONO ABS: 0.5 10*3/uL (ref 0.1–1.0)
Monocytes Relative: 13.3 % — ABNORMAL HIGH (ref 3.0–12.0)
NEUTROS ABS: 2.3 10*3/uL (ref 1.4–7.7)
NEUTROS PCT: 56.7 % (ref 43.0–77.0)
PLATELETS: 79 10*3/uL — AB (ref 150.0–400.0)
RBC: 3.52 Mil/uL — ABNORMAL LOW (ref 4.22–5.81)
RDW: 16.3 % — AB (ref 11.5–15.5)
WBC: 4 10*3/uL (ref 4.0–10.5)

## 2014-11-18 LAB — COMPREHENSIVE METABOLIC PANEL
ALT: 94 U/L — AB (ref 0–53)
AST: 256 U/L — AB (ref 0–37)
Albumin: 3.8 g/dL (ref 3.5–5.2)
Alkaline Phosphatase: 248 U/L — ABNORMAL HIGH (ref 39–117)
BILIRUBIN TOTAL: 2.9 mg/dL — AB (ref 0.2–1.2)
BUN: 15 mg/dL (ref 6–23)
CALCIUM: 9.2 mg/dL (ref 8.4–10.5)
CHLORIDE: 102 meq/L (ref 96–112)
CO2: 29 meq/L (ref 19–32)
CREATININE: 0.68 mg/dL (ref 0.40–1.50)
GFR: 156.55 mL/min (ref >60.00)
Glucose, Bld: 133 mg/dL — ABNORMAL HIGH (ref 70–99)
Potassium: 4.3 mEq/L (ref 3.5–5.1)
SODIUM: 141 meq/L (ref 135–145)
Total Protein: 8.5 g/dL — ABNORMAL HIGH (ref 6.0–8.3)

## 2014-11-18 LAB — MAGNESIUM: Magnesium: 1.6 mg/dL (ref 1.5–2.5)

## 2014-11-18 LAB — HEMOGLOBIN A1C: HEMOGLOBIN A1C: 4.8 % (ref 4.6–6.5)

## 2014-11-18 NOTE — Progress Notes (Signed)
Pre visit review using our clinic review tool, if applicable. No additional management support is needed unless otherwise documented below in the visit note. 

## 2014-11-18 NOTE — Progress Notes (Signed)
Subjective:    Patient ID: Arthur Woodard, male    DOB: [birth year], 54 y.o.   MRN: [id number]  HPI   Pt in for blood pressure check. His bp is well controlled. Pt is on amlodpine and metroprolol. No cardiac or neurologic signs or symptoms.  Pt has hx of hyperglycemia in past. Then 3 months ago his a1-c was low. Has not been rechecked. No hyperglycemia signs or symptoms.  Pt has seen GI. Pt drinks 2 small vodka bottles a day. Basicaly shot quantity. Pt last GI a/p reads.  1. decompensated alcoholic Cirrhosis/portal HTN/ascites/thrombocytopenia -- he looks slightly better today and has responded to diuresis. I greatly appreciate Dr. Deterding's help and assistance with diuresis and renal function. I have asked that he do everything he can to never drink alcohol again. He is frustrated by this but understands why and at present doesn't plan to drink again. Given that he has responded to diuresis he will continue on current doses of diuretics and has followup with renal next week. I will avoid large volume paracentesis for now (it doesn't seem that he needs one at present anyway).  --Continue low-sodium diet --Alcohol avoidance --Variceal screening -- deferred for now given acute signs and symptoms, discuss again at followup   On review pt wt appears stable.     Review of Systems  Constitutional: Negative for fever, chills, diaphoresis, activity change and fatigue.  Respiratory: Negative for cough, chest tightness and shortness of breath.   Cardiovascular: Negative for chest pain, palpitations and leg swelling.  Gastrointestinal: Negative for nausea, vomiting, abdominal pain and diarrhea.  Musculoskeletal: Negative for neck pain and neck stiffness.  Neurological: Negative for dizziness, tremors, seizures, syncope, facial asymmetry, speech difficulty, weakness, light-headedness, numbness and headaches.  Psychiatric/Behavioral: Negative for behavioral problems, confusion and  agitation. The patient is not nervous/anxious.     Past Medical History  Diagnosis Date  . Cirrhosis   . Splenomegaly, congestive, chronic   . Thrombocytopenia   . Liver nodule 05/15/2011  . Allergy   . Anemia   . Diabetes mellitus   . Hypertension   . Liver lesion   . Helicobacter pylori gastritis     severe  . Tubular adenoma of colon   . Sleep apnea   . Chronic kidney disease     Social History   Social History  . Marital Status: Married    Spouse Name: N/A  . Number of Children: 2  . Years of Education: N/A   Occupational History  . unemployed    Social History Main Topics  . Smoking status: Never Smoker   . Smokeless tobacco: Never Used  . Alcohol Use: No  . Drug Use: No  . Sexual Activity: No   Other Topics Concern  . Not on file   Social History Narrative    Past Surgical History  Procedure Laterality Date  . Tooth extraction    . Bone marrow biopsy      Family History  Problem Relation Age of Onset  . Colon cancer Neg Hx   . Esophageal cancer Neg Hx   . Rectal cancer Neg Hx   . Stomach cancer Neg Hx     No Known Allergies  Current Outpatient Prescriptions on File Prior to Visit  Medication Sig Dispense Refill  . amLODipine (NORVASC) 2.5 MG tablet Take 1 tablet (2.5 mg total) by mouth daily. 30 tablet 3  . cetirizine (ZYRTEC) 10 MG tablet Take 10 mg by mouth daily as needed  for allergies.     Marland Kitchen FOLIC ACID PO Take 1 tablet by mouth daily. Pt takes 400 mcg daily    . magnesium 30 MG tablet Take 1 tablet (30 mg total) by mouth 2 (two) times daily. 60 tablet 0  . metoprolol tartrate (LOPRESSOR) 25 MG tablet Take 0.5 tablets (12.5 mg total) by mouth 2 (two) times daily. 60 tablet 3  . Na Sulfate-K Sulfate-Mg Sulf (SUPREP BOWEL PREP) SOLN Suprep-use as directed 354 mL 0   No current facility-administered medications on file prior to visit.    BP 124/76 mmHg  Pulse 72  Temp(Src) 98 F (36.7 C) (Oral)  Resp 16  Ht '5\' 3"'  (1.6 m)  Wt 135 lb 4  oz (61.349 kg)  BMI 23.96 kg/m2  SpO2 98%       Objective:   Physical Exam  General Appearance- Not in acute distress.  HEENT Eyes- Scleraeral/Conjuntiva-bilat- Not Yellow. Mouth & Throat- Normal.  Chest and Lung Exam Auscultation: Breath sounds:-Normal. CTA. Adventitious sounds:- No Adventitious sounds.  Cardiovascular Auscultation:Rythm - Regular. Heart Sounds -Normal heart sounds.  Abdomen Inspection:-Inspection Normal.  Palpation/Perucssion: Palpation and Percussion of the abdomen reveal- Non Tender, No Rebound tenderness, No rigidity(Guarding) and No Palpable abdominal masses.  Liver:-Normal.  Spleen:- Normal.     Neurologic Cranial Nerve exam:- CN III-XII intact(No nystagmus), symmetric smile. Strength:- 5/5 equal and symmetric strength both upper and lower extremities.      Assessment & Plan:  For your htn (bp well controlled today) continue your current bp medications.   For you hx of hyperglycemia will repeat a1-c and urine microalbumin today. Not 3 months ago a1-c was low but still want to confirm that is the case.  For your hx of alcoholic cirrhosis I ask that you please stop drinking alcohol completely. Will repeat cmp today and repeat cbc to follow you platelets.  Follow up in 3 months or as needed.

## 2014-11-18 NOTE — Patient Instructions (Addendum)
For your htn (bp well controlled today) continue your current bp medications.   For you hx of hyperglycemia will repeat a1-c and urine microalbumin today. Not 3 months ago a1-c was low but still want to confirm that is the case.  For your hx of alcoholic cirrhosis I ask that you please stop drinking alcohol completely. Will repeat cmp today and repeat cbc to follow you platelets.  Follow up in 3 months or as needed.

## 2014-11-19 LAB — MICROALBUMIN, URINE: Microalb, Ur: 1.3 mg/dL (ref <2.0)

## 2015-01-18 ENCOUNTER — Ambulatory Visit (AMBULATORY_SURGERY_CENTER): Payer: 59 | Admitting: Internal Medicine

## 2015-01-18 ENCOUNTER — Encounter: Payer: Self-pay | Admitting: Internal Medicine

## 2015-01-18 VITALS — BP 116/82 | HR 66 | Temp 97.3°F | Resp 33 | Ht 63.0 in | Wt 136.0 lb

## 2015-01-18 DIAGNOSIS — Z86010 Personal history of colonic polyps: Secondary | ICD-10-CM | POA: Diagnosis not present

## 2015-01-18 DIAGNOSIS — I8500 Esophageal varices without bleeding: Secondary | ICD-10-CM | POA: Diagnosis not present

## 2015-01-18 DIAGNOSIS — D124 Benign neoplasm of descending colon: Secondary | ICD-10-CM

## 2015-01-18 DIAGNOSIS — K7030 Alcoholic cirrhosis of liver without ascites: Secondary | ICD-10-CM | POA: Diagnosis not present

## 2015-01-18 DIAGNOSIS — D123 Benign neoplasm of transverse colon: Secondary | ICD-10-CM

## 2015-01-18 MED ORDER — SODIUM CHLORIDE 0.9 % IV SOLN: 500.0000 mL | INTRAVENOUS | Status: DC

## 2015-01-18 NOTE — Op Note (Signed)
[facility]  [address]. [place], [id number]   COLONOSCOPY PROCEDURE REPORT  PATIENT: Arthur Woodard, Arthur Woodard  MR#: [id number] BIRTHDATE: 07-16-1960 , 54  yrs. old GENDER: male ENDOSCOPIST: Jerene Bears, MD PROCEDURE DATE:  01/18/2015 PROCEDURE:   Colonoscopy, surveillance and Colonoscopy with snare polypectomy First Screening Colonoscopy - Avg.  risk and is 50 yrs.  old or older - No.  Prior Negative Screening - Now for repeat screening. N/A  History of Adenoma - Now for follow-up colonoscopy & has been > or = to 3 yrs.  Yes hx of adenoma.  Has been 3 or more years since last colonoscopy.  Polyps removed today? Yes ASA CLASS:   Class III INDICATIONS:Surveillance due to prior colonic neoplasia and PH Colon Adenoma (last colonoscopy 2013). MEDICATIONS: Monitored anesthesia care, this was the total dose used for all procedures at this session, and Propofol 400 mg IV  DESCRIPTION OF PROCEDURE:   After the risks benefits and alternatives of the procedure were thoroughly explained, informed consent was obtained.  The digital rectal exam revealed no rectal mass and revealed several skin tags.   The LB [id number] endoscope was introduced through the anus and advanced to the cecum, which was identified by both the appendix and ileocecal valve. No adverse events experienced.   The quality of the prep was good.  (Suprep was used)  The instrument was then slowly withdrawn as the colon was fully examined. Estimated blood loss is zero unless otherwise noted in this procedure report.  COLON FINDINGS: Three sessile polyps ranging between 3-70mm in size were found in the transverse colon.  Polypectomies were performed with a cold snare.  The resection was complete, the polyp tissue was completely retrieved and sent to histology.   There was mild diverticulosis noted in the ascending colon and sigmoid colon. Retroflexed views revealed internal hemorrhoids. The time to cecum =  2.3 Withdrawal time = 9.1   The scope was withdrawn and the procedure completed. COMPLICATIONS: There were no immediate complications.  ENDOSCOPIC IMPRESSION: 1.   Three sessile polyps ranging between 3-13mm in size were found in the transverse colon; polypectomies were performed with a cold snare 2.   Mild diverticulosis was noted in the ascending colon and sigmoid colon  RECOMMENDATIONS: 1.  Await pathology results 2.  High fiber diet 3.  Timing of repeat colonoscopy will be determined by pathology findings. 4.  You will receive a letter within 1-2 weeks with the results of your biopsy as well as final recommendations.  Please call my office if you have not received a letter after 3 weeks.  eSigned:  Jerene Bears, MD 01/18/2015 11:21 AM   cc:  the patient, PCP

## 2015-01-18 NOTE — Patient Instructions (Signed)

## 2015-01-18 NOTE — Op Note (Signed)
[place]  [address]. [place], [address]   ENDOSCOPY PROCEDURE REPORT  PATIENT: Arthur Woodard, Arthur Woodard  MR#: [id number] BIRTHDATE: 03-May-1960 , 54  yrs. old GENDER: male ENDOSCOPIST: Jerene Bears, MD PROCEDURE DATE:  01/18/2015 PROCEDURE:  EGD, diagnostic ASA CLASS:     Class III INDICATIONS:  history of cirrhosis for variceal screening, last EGD 2013 no varices at that time. MEDICATIONS: Monitored anesthesia care and Propofol 200 mg IV TOPICAL ANESTHETIC: none  DESCRIPTION OF PROCEDURE: After the risks benefits and alternatives of the procedure were thoroughly explained, informed consent was obtained.  The LB [id number] endoscope was introduced through the mouth and advanced to the second portion of the duodenum , Without limitations.  The instrument was slowly withdrawn as the mucosa was fully examined.   ESOPHAGUS: There were small varices in the distal esophagus without stigmata of bleeding.  STOMACH: The mucosa of the stomach appeared normal.  DUODENUM: The duodenal mucosa showed no abnormalities in the bulb and 2nd part of the duodenum.  Retroflexed views revealed no abnormalities.     The scope was then withdrawn from the patient and the procedure completed.  COMPLICATIONS: There were no immediate complications.  ENDOSCOPIC IMPRESSION: 1.   Small distal esophageal varices without stigmata 2.   The mucosa of the stomach appeared normal 3.   The duodenal mucosa showed no abnormalities in the bulb and 2nd part of the duodenum  RECOMMENDATIONS: 1.  Will speak with primary care about changing metoprolol to carvedilol or nadolol given small esophageal varices 2.  Repeat upper endoscopy for variceal screening should hepatic decompensation occur   eSigned:  Jerene Bears, MD 01/18/2015 11:18 AM    CC:  the patient, PCP

## 2015-01-18 NOTE — Progress Notes (Signed)
Called to room to assist during endoscopic procedure.  Patient ID and intended procedure confirmed with present staff. Received instructions for my participation in the procedure from the performing physician.  

## 2015-01-18 NOTE — Progress Notes (Signed)
Patient awakening,vss,report to rn 

## 2015-01-19 ENCOUNTER — Telehealth: Payer: Self-pay | Admitting: *Deleted

## 2015-01-19 NOTE — Telephone Encounter (Signed)
  Follow up Call-  Call back number 01/18/2015  Post procedure Call Back phone  # [phone]  Permission to leave phone message Yes     Patient questions:  Do you have a fever, pain , or abdominal swelling? No. Pain Score  0 *  Have you tolerated food without any problems? Yes.    Have you been able to return to your normal activities? Yes.    Do you have any questions about your discharge instructions: Diet   No. Medications  No. Follow up visit  No.  Do you have questions or concerns about your Care? No.  Actions: * If pain score is 4 or above: No action needed, pain <4.

## 2015-01-23 ENCOUNTER — Encounter: Payer: Self-pay | Admitting: Internal Medicine

## 2015-02-17 ENCOUNTER — Ambulatory Visit: Payer: 59 | Admitting: Medical

## 2015-03-15 ENCOUNTER — Ambulatory Visit (INDEPENDENT_AMBULATORY_CARE_PROVIDER_SITE_OTHER): Payer: BLUE CROSS/BLUE SHIELD | Admitting: Medical

## 2015-03-15 ENCOUNTER — Encounter: Payer: Self-pay | Admitting: Medical

## 2015-03-15 VITALS — BP 116/84 | HR 70 | Temp 98.1°F | Ht 63.0 in | Wt 132.0 lb

## 2015-03-15 DIAGNOSIS — I8500 Esophageal varices without bleeding: Secondary | ICD-10-CM | POA: Diagnosis not present

## 2015-03-15 DIAGNOSIS — F1010 Alcohol abuse, uncomplicated: Secondary | ICD-10-CM

## 2015-03-15 DIAGNOSIS — K7030 Alcoholic cirrhosis of liver without ascites: Secondary | ICD-10-CM

## 2015-03-15 DIAGNOSIS — I10 Essential (primary) hypertension: Secondary | ICD-10-CM | POA: Diagnosis not present

## 2015-03-15 LAB — COMPREHENSIVE METABOLIC PANEL
ALT: 68 U/L — ABNORMAL HIGH (ref 0–53)
AST: 153 U/L — ABNORMAL HIGH (ref 0–37)
Albumin: 3.5 g/dL (ref 3.5–5.2)
Alkaline Phosphatase: 234 U/L — ABNORMAL HIGH (ref 39–117)
BUN: 14 mg/dL (ref 6–23)
CO2: 28 mEq/L (ref 19–32)
Calcium: 9 mg/dL (ref 8.4–10.5)
Chloride: 103 mEq/L (ref 96–112)
Creatinine, Ser: 0.89 mg/dL (ref 0.40–1.50)
GFR: 114.62 mL/min (ref >60.00)
Glucose, Bld: 142 mg/dL — ABNORMAL HIGH (ref 70–99)
Potassium: 4.4 mEq/L (ref 3.5–5.1)
Sodium: 138 mEq/L (ref 135–145)
Total Bilirubin: 1.4 mg/dL — ABNORMAL HIGH (ref 0.2–1.2)
Total Protein: 8.2 g/dL (ref 6.0–8.3)

## 2015-03-15 LAB — CBC WITH DIFFERENTIAL/PLATELET
BASOS ABS: 0 10*3/uL (ref 0.0–0.1)
Basophils Relative: 0.4 % (ref 0.0–3.0)
EOS PCT: 4.3 % (ref 0.0–5.0)
Eosinophils Absolute: 0.2 10*3/uL (ref 0.0–0.7)
HEMATOCRIT: 35.3 % — AB (ref 39.0–52.0)
Hemoglobin: 11.9 g/dL — ABNORMAL LOW (ref 13.0–17.0)
LYMPHS PCT: 35.8 % (ref 12.0–46.0)
Lymphs Abs: 1.6 10*3/uL (ref 0.7–4.0)
MCHC: 33.8 g/dL (ref 30.0–36.0)
MCV: 103.8 fl — AB (ref 78.0–100.0)
MONOS PCT: 18.1 % — AB (ref 3.0–12.0)
Monocytes Absolute: 0.8 10*3/uL (ref 0.1–1.0)
NEUTROS ABS: 1.8 10*3/uL (ref 1.4–7.7)
Neutrophils Relative %: 41.4 % — ABNORMAL LOW (ref 43.0–77.0)
Platelets: 85 10*3/uL — ABNORMAL LOW (ref 150.0–400.0)
RBC: 3.4 Mil/uL — AB (ref 4.22–5.81)
RDW: 14.8 % (ref 11.5–15.5)
WBC: 4.4 10*3/uL (ref 4.0–10.5)

## 2015-03-15 MED ORDER — CARVEDILOL 6.25 MG PO TABS: 6.2500 mg | ORAL_TABLET | Freq: Two times a day (BID) | ORAL | Status: DC

## 2015-03-15 NOTE — Patient Instructions (Addendum)
Bp well controlled.Continue amlodipine. But will switch pt to carvedilol and stop metroprol. This is based on GI recommendation after varices discovered.  Over next month keep checking your bp. Document readings. With med change want to make sure your bp is well controlled. May need to adjust dose if bp not controlled.  Please do not drink any alcohol. This could worsen the varices and in future and may develop life threatening complication if varices worsen.  Will get cmp and cbc today. Recheck liver enzymes and platlets.  Follow up in 1 month or as needed

## 2015-03-15 NOTE — Assessment & Plan Note (Signed)
Not bleeding. Will try carvedilol in place of metoprolol at GI recommendation.

## 2015-03-15 NOTE — Progress Notes (Signed)
Pre visit review using our clinic review tool, if applicable. No additional management support is needed unless otherwise documented below in the visit note. 

## 2015-03-15 NOTE — Progress Notes (Signed)
Subjective:    Patient ID: Arthur Woodard, male    DOB: 12-21-1960, 55 y.o.   MRN: [id number]  HPI   Pt in for follow up.  Bp today 116/84. No cardiac or neurologic signs or symptoms. When he checks it is about 120/80.  Pt is currently on metoprolol 25 mg 1/2 tab bid. Still on amlodipine.   Pt had coloscopy and endoscopy. Pt did have small varices on endoscopy. GI recommended he be switch to to carvedilol or nadolol from metoprolol.  Pt also has liver enzymes in past.  Pt last drank at christmas. 3 shots of Vodka that day. None since.  Pt has history of alcohol abuse.  Pt expresses he understands danger of continuing to drink alcohol.     Review of Systems  Constitutional: Negative for fever, chills, diaphoresis, activity change and fatigue.  Respiratory: Negative for cough, chest tightness and shortness of breath.   Cardiovascular: Negative for chest pain, palpitations and leg swelling.  Gastrointestinal: Negative for nausea, vomiting, abdominal pain, diarrhea, constipation and blood in stool.  Musculoskeletal: Negative for neck pain and neck stiffness.  Neurological: Negative for dizziness, seizures, speech difficulty, weakness and headaches.  Psychiatric/Behavioral: Negative for confusion and agitation. The patient is not nervous/anxious.    Past Medical History  Diagnosis Date  . Cirrhosis ([place])   . Splenomegaly, congestive, chronic   . Thrombocytopenia ([place])   . Liver nodule 05/15/2011  . Allergy   . Anemia   . Diabetes mellitus   . Hypertension   . Liver lesion   . Helicobacter pylori gastritis     severe  . Tubular adenoma of colon   . Sleep apnea   . Chronic kidney disease     Social History   Social History  . Marital Status: Married    Spouse Name: N/A  . Number of Children: 2  . Years of Education: N/A   Occupational History  . unemployed    Social History Main Topics  . Smoking status: Never Smoker   . Smokeless tobacco: Never Used  .  Alcohol Use: No  . Drug Use: No  . Sexual Activity: No   Other Topics Concern  . Not on file   Social History Narrative    Past Surgical History  Procedure Laterality Date  . Tooth extraction    . Bone marrow biopsy      Family History  Problem Relation Age of Onset  . Colon cancer Neg Hx   . Esophageal cancer Neg Hx   . Rectal cancer Neg Hx   . Stomach cancer Neg Hx     No Known Allergies  Current Outpatient Prescriptions on File Prior to Visit  Medication Sig Dispense Refill  . amLODipine (NORVASC) 2.5 MG tablet Take 1 tablet (2.5 mg total) by mouth daily. 30 tablet 3  . cetirizine (ZYRTEC) 10 MG tablet Take 10 mg by mouth daily as needed for allergies.     Marland Kitchen FOLIC ACID PO Take 1 tablet by mouth daily. Pt takes 400 mcg daily    . metoprolol tartrate (LOPRESSOR) 25 MG tablet Take 0.5 tablets (12.5 mg total) by mouth 2 (two) times daily. 60 tablet 3   No current facility-administered medications on file prior to visit.    BP 116/84 mmHg  Pulse 70  Temp(Src) 98.1 F (36.7 C) (Oral)  Ht '5\' 3"'  (1.6 m)  Wt 132 lb (59.875 kg)  BMI 23.39 kg/m2  SpO2 98%       Objective:  Physical Exam  General Mental Status- Alert. General Appearance- Not in acute distress.   Skin General: Color- Normal Color. Moisture- Normal Moisture. No jaundice.  Neck Carotid Arteries- Normal color. Moisture- Normal Moisture. No carotid bruits. No JVD.  Chest and Lung Exam Auscultation: Breath Sounds:-Normal.  Cardiovascular Auscultation:Rythm- Regular. Murmurs & Other Heart Sounds:Auscultation of the heart reveals- No Murmurs.  Abdomen Inspection:-Inspeection Normal. Palpation/Percussion:Note:No mass. Palpation and Percussion of the abdomen reveal- Non Tender, Non Distended + BS, no rebound or guarding.    Neurologic Cranial Nerve exam:- CN III-XII intact(No nystagmus), symmetric smile. Strength:- 5/5 equal and symmetric strength both upper and lower extremities.        Assessment & Plan:  Bp well controlled.Continue amlodipine. But will switch pt to carvedilol and stop metroprol. This is based on GI recommendation after varices discovered.  Over next month keep checking your bp. Document readings. With med change want to make sure your bp is well controlled. May need to adjust dose if bp not controlled.  Please do not drink any alcohol. This could worsen the varices and in future and may develop life threatening complication if varices worsen.  Will get cmp and cbc today. Recheck liver enzymes and platlets.  Follow up in 1 month or as needed

## 2015-03-20 ENCOUNTER — Other Ambulatory Visit: Payer: Self-pay | Admitting: Medical

## 2015-03-30 ENCOUNTER — Telehealth: Payer: Self-pay | Admitting: Medical

## 2015-03-30 MED ORDER — CARVEDILOL 6.25 MG PO TABS: 6.2500 mg | ORAL_TABLET | Freq: Two times a day (BID) | ORAL | Status: AC

## 2015-03-30 NOTE — Telephone Encounter (Signed)
Pt was last seen 03/15/15.  Please advise if pt will need an OV before any further refills will be given.

## 2015-03-30 NOTE — Telephone Encounter (Signed)
Relation to [id number] Call back [phone] Pharmacy: [facility] [id number] [place], [place] [phone] (Phone) [phone] (Fax)         Reason for call:  Patient requesting a refill carvedilol (COREG) 6.25 MG tablet

## 2015-03-30 NOTE — Telephone Encounter (Signed)
I refilled his coreg for 4 months.

## 2015-03-30 NOTE — Telephone Encounter (Signed)
Left message for pt that Rx was sent to pharmacy and he would need to follow up before the last refill was out.

## 2015-04-13 ENCOUNTER — Ambulatory Visit: Payer: BLUE CROSS/BLUE SHIELD | Admitting: Medical

## 2015-05-02 ENCOUNTER — Ambulatory Visit (HOSPITAL_BASED_OUTPATIENT_CLINIC_OR_DEPARTMENT_OTHER)
Admission: RE | Admit: 2015-05-02 | Discharge: 2015-05-02 | Disposition: A | Discharge status: Home / Self Care | Payer: BLUE CROSS/BLUE SHIELD | Source: Ambulatory Visit | Attending: Medical | Admitting: Medical

## 2015-05-02 ENCOUNTER — Ambulatory Visit (INDEPENDENT_AMBULATORY_CARE_PROVIDER_SITE_OTHER): Payer: BLUE CROSS/BLUE SHIELD | Admitting: Medical

## 2015-05-02 ENCOUNTER — Encounter: Payer: Self-pay | Admitting: Medical

## 2015-05-02 VITALS — BP 120/80 | HR 83 | Temp 98.4°F | Ht 63.0 in | Wt 137.8 lb

## 2015-05-02 DIAGNOSIS — I10 Essential (primary) hypertension: Secondary | ICD-10-CM

## 2015-05-02 DIAGNOSIS — M545 Low back pain, unspecified: Secondary | ICD-10-CM

## 2015-05-02 DIAGNOSIS — F1010 Alcohol abuse, uncomplicated: Secondary | ICD-10-CM | POA: Diagnosis not present

## 2015-05-02 DIAGNOSIS — K7031 Alcoholic cirrhosis of liver with ascites: Secondary | ICD-10-CM | POA: Diagnosis not present

## 2015-05-02 DIAGNOSIS — Z23 Encounter for immunization: Secondary | ICD-10-CM | POA: Diagnosis not present

## 2015-05-02 DIAGNOSIS — Z8639 Personal history of other endocrine, nutritional and metabolic disease: Secondary | ICD-10-CM

## 2015-05-02 DIAGNOSIS — D649 Anemia, unspecified: Secondary | ICD-10-CM

## 2015-05-02 LAB — CBC WITH DIFFERENTIAL/PLATELET
Basophils Absolute: 0 10*3/uL (ref 0.0–0.1)
Basophils Relative: 0.6 % (ref 0.0–3.0)
EOS PCT: 3.6 % (ref 0.0–5.0)
Eosinophils Absolute: 0.1 10*3/uL (ref 0.0–0.7)
HCT: 34.2 % — ABNORMAL LOW (ref 39.0–52.0)
HEMOGLOBIN: 11.9 g/dL — AB (ref 13.0–17.0)
LYMPHS ABS: 1.2 10*3/uL (ref 0.7–4.0)
Lymphocytes Relative: 31.1 % (ref 12.0–46.0)
MCHC: 34.7 g/dL (ref 30.0–36.0)
MCV: 100.6 fl — AB (ref 78.0–100.0)
MONOS PCT: 13 % — AB (ref 3.0–12.0)
Monocytes Absolute: 0.5 10*3/uL (ref 0.1–1.0)
Neutro Abs: 2 10*3/uL (ref 1.4–7.7)
Neutrophils Relative %: 51.7 % (ref 43.0–77.0)
Platelets: 57 10*3/uL — ABNORMAL LOW (ref 150.0–400.0)
RBC: 3.4 Mil/uL — AB (ref 4.22–5.81)
RDW: 16.2 % — ABNORMAL HIGH (ref 11.5–15.5)
WBC: 3.8 10*3/uL — ABNORMAL LOW (ref 4.0–10.5)

## 2015-05-02 LAB — COMPREHENSIVE METABOLIC PANEL
ALBUMIN: 3.4 g/dL — AB (ref 3.5–5.2)
ALT: 59 U/L — ABNORMAL HIGH (ref 0–53)
AST: 162 U/L — ABNORMAL HIGH (ref 0–37)
Alkaline Phosphatase: 245 U/L — ABNORMAL HIGH (ref 39–117)
BUN: 19 mg/dL (ref 6–23)
CALCIUM: 9 mg/dL (ref 8.4–10.5)
CO2: 22 mEq/L (ref 19–32)
Chloride: 105 mEq/L (ref 96–112)
Creatinine, Ser: 0.76 mg/dL (ref 0.40–1.50)
GFR: 137.46 mL/min (ref >60.00)
Glucose, Bld: 179 mg/dL — ABNORMAL HIGH (ref 70–99)
POTASSIUM: 4.3 meq/L (ref 3.5–5.1)
Sodium: 138 mEq/L (ref 135–145)
TOTAL PROTEIN: 8.3 g/dL (ref 6.0–8.3)
Total Bilirubin: 2.1 mg/dL — ABNORMAL HIGH (ref 0.2–1.2)

## 2015-05-02 LAB — GAMMA GT: GGT: 2209 U/L — AB (ref 7–51)

## 2015-05-02 NOTE — Progress Notes (Signed)
Pre visit review using our clinic review tool, if applicable. No additional management support is needed unless otherwise documented below in the visit note. 

## 2015-05-02 NOTE — Progress Notes (Signed)
Subjective:    Patient ID: Arthur Woodard, male    DOB: 05/10/1960, 55 y.o.   MRN: [id number]  HPI   Pt still is drinking per his wife. Pt wife states that he has decreased. Pt drinks about 1-2 shots of vodka at dinner. Pt liver enzymes have been mild elevated.   Pt blood pressure is controlled today. Has been recently.   Pt states some low back occasionally when he works long hours. Pt does not take any medication for pain. No pain radiating to legs. No numbness to legs. He has had pain on and off for a while. But he did not mention to me.    Review of Systems  Constitutional: Negative for fever, chills and fatigue.  HENT: Negative for congestion and drooling.   Respiratory: Negative for cough, shortness of breath and wheezing.   Cardiovascular: Negative for chest pain and palpitations.  Gastrointestinal: Negative for nausea, abdominal pain, diarrhea and blood in stool.  Musculoskeletal: Negative for back pain.  Skin: Negative for pallor.  Neurological: Negative for dizziness and headaches.  Hematological: Negative for adenopathy. Does not bruise/bleed easily.  Psychiatric/Behavioral: Negative for behavioral problems and confusion.     Past Medical History  Diagnosis Date  . Cirrhosis ([place])   . Splenomegaly, congestive, chronic   . Thrombocytopenia ([place])   . Liver nodule 05/15/2011  . Allergy   . Anemia   . Diabetes mellitus   . Hypertension   . Liver lesion   . Helicobacter pylori gastritis     severe  . Tubular adenoma of colon   . Sleep apnea   . Chronic kidney disease     Social History   Social History  . Marital Status: Married    Spouse Name: N/A  . Number of Children: 2  . Years of Education: N/A   Occupational History  . unemployed    Social History Main Topics  . Smoking status: Never Smoker   . Smokeless tobacco: Never Used  . Alcohol Use: No  . Drug Use: No  . Sexual Activity: No   Other Topics Concern  . Not on file   Social History  Narrative    Past Surgical History  Procedure Laterality Date  . Tooth extraction    . Bone marrow biopsy      Family History  Problem Relation Age of Onset  . Colon cancer Neg Hx   . Esophageal cancer Neg Hx   . Rectal cancer Neg Hx   . Stomach cancer Neg Hx     No Known Allergies  Current Outpatient Prescriptions on File Prior to Visit  Medication Sig Dispense Refill  . amLODipine (NORVASC) 2.5 MG tablet TAKE ONE TABLET BY MOUTH ONCE DAILY 30 tablet 0  . carvedilol (COREG) 6.25 MG tablet Take 1 tablet (6.25 mg total) by mouth 2 (two) times daily with a meal. 60 tablet 3  . cetirizine (ZYRTEC) 10 MG tablet Take 10 mg by mouth daily as needed for allergies.     Marland Kitchen FOLIC ACID PO Take 1 tablet by mouth daily. Pt takes 400 mcg daily     No current facility-administered medications on file prior to visit.    BP 120/80 mmHg  Pulse 83  Temp(Src) 98.4 F (36.9 C) (Oral)  Ht _0  (1.6 m)  Wt 137 lb 12.8 oz (62.506 kg)  BMI 24.42 kg/m2  SpO2 98%       Objective:   Physical Exam  General Appearance- Not in acute  distress.  HEENT Eyes- Scleraeral/Conjuntiva-bilat- Not Yellow. Mouth & Throat- Normal.  Chest and Lung Exam Auscultation: Breath sounds:-Normal. Adventitious sounds:- No Adventitious sounds.  Cardiovascular Auscultation:Rythm - Regular. Heart Sounds -Normal heart sounds.  Abdomen Inspection:-Inspection Normal.  Palpation/Perucssion: Palpation and Percussion of the abdomen reveal- Non Tender, No Rebound tenderness, No rigidity(Guarding) and No Palpable abdominal masses.  Liver:-Normal.  Spleen:- Normal.    Back Mid lumbar no lumbar spine tenderness to palpation. No  straight leg lift pain Pain on lateral movements and flexion/extension of the spine.  Lower ext neurologic  L5-S1 sensation intact bilaterally. Normal patellar reflexes bilaterally. No foot drop bilaterally.       Assessment & Plan:  Continue your current bp  medications.  Would try to stop alcohol completely.  Get labs today. Cmp, ggt, and a1-c.  X-ray of lumbar spine today.   Can use low dose ibuprofen otc for occasional back pain.(but not chronic use) No tylenol  Follow up date to be determined after labs are back.

## 2015-05-02 NOTE — Patient Instructions (Addendum)
Continue your current bp medications.  Would try to stop alcohol completely.  Get labs today. Cmp, ggt, and a1-c.  X-ray of lumbar spine today.   Can use low dose ibuprofen otc for occasional back pain.(but not chronic use) No tylenol  Follow up date to be determined after labs are back.

## 2015-05-04 ENCOUNTER — Other Ambulatory Visit (INDEPENDENT_AMBULATORY_CARE_PROVIDER_SITE_OTHER): Payer: BLUE CROSS/BLUE SHIELD

## 2015-05-04 DIAGNOSIS — R739 Hyperglycemia, unspecified: Secondary | ICD-10-CM

## 2015-05-04 LAB — HEMOGLOBIN A1C: Hgb A1c MFr Bld: 5.6 % (ref 4.6–6.5)

## 2015-06-20 ENCOUNTER — Telehealth: Payer: Self-pay | Admitting: *Deleted

## 2015-06-20 NOTE — Telephone Encounter (Signed)
[id number]/Rhonda Davis, BSN, RN3, CCM, CN: Telephone to patient and family to introduce myself as the new [facility] and to if family has any needs at this time.  No needs present contact information given. 

## 2015-10-02 ENCOUNTER — Telehealth: Payer: Self-pay | Admitting: Medical

## 2015-10-02 NOTE — Telephone Encounter (Signed)
Caller name: Elder Negus Relationship to patient: wife Can be reached: [phone]   Reason for call: called Caryl Pina with [employer]. She states nurse to call wife back in approx 15-30 min.

## 2015-10-02 NOTE — Telephone Encounter (Signed)
Attempted to reach patient's spouse. Left message for a call back.

## 2015-10-02 NOTE — Telephone Encounter (Signed)
Patient Name: Arthur Woodard DOB: 01-Dec-1960 Initial Comment Caller states husband is having facial swelling, poss allergic reaction. Nurse Assessment Nurse: Ronnald Ramp, RN, Miranda Date/Time (Eastern Time): 10/02/2015 10:47:02 AM Confirm and document reason for call. If symptomatic, describe symptoms. You must click the next button to save text entered. ---Caller states he is allergic to shrimp and on Friday he touch some shrimp at work and the right side of his face has been puffy since Saturday. Has the patient traveled out of the country within the last 30 days? ---Not Applicable Does the patient have any new or worsening symptoms? ---Yes Will a triage be completed? ---Yes Related visit to physician within the last 2 weeks? ---No Does the PT have any chronic conditions? (i.e. diabetes, asthma, etc.) ---Yes List chronic conditions. ---HTN Is this a behavioral health or substance abuse call? ---No Guidelines Guideline Title Affirmed Question Affirmed Notes Face Swelling [1] Mild facial swelling from food reaction AND [2] diagnosis already confirmed (all triage questions negative) Final Disposition User [place], RN, Miranda Disagree/Comply: Leta Baptist

## 2015-10-16 ENCOUNTER — Other Ambulatory Visit: Payer: Self-pay

## 2015-10-16 MED ORDER — GLUCOSE BLOOD VI STRP: ORAL_STRIP | 12 refills | Status: AC

## 2015-11-03 ENCOUNTER — Telehealth: Payer: Self-pay

## 2015-11-03 ENCOUNTER — Other Ambulatory Visit: Payer: Self-pay

## 2015-11-03 DIAGNOSIS — K7469 Other cirrhosis of liver: Secondary | ICD-10-CM

## 2015-11-03 NOTE — Telephone Encounter (Signed)
Pt scheduled for MRI of liver at [facility] 11/14/15@8am , pt to arrive there at 7:45am. Pt to be NPO after midnight.  Left message for pt to call back.

## 2015-11-03 NOTE — Telephone Encounter (Signed)
-----   Message from Algernon Huxley, RN sent at 01/30/2015  1:53 PM EST ----- Regarding: FW: MRI   ----- Message -----    From: Algernon Huxley, RN    Sent: 01/23/2015      To: Maury Dus, RN Subject: MRI                                            Pt needs MRI in 1 year-follow-up liver lesions

## 2015-11-06 NOTE — Telephone Encounter (Signed)
Left message for pt to call back  °

## 2015-11-08 NOTE — Telephone Encounter (Signed)
After leaving multiple messages have been unable to reach pt. Letter mailed to pt regarding appointment.

## 2015-11-14 ENCOUNTER — Ambulatory Visit (HOSPITAL_COMMUNITY): Admission: RE | Admit: 2015-11-14 | Payer: BLUE CROSS/BLUE SHIELD | Source: Ambulatory Visit

## 2016-01-29 LAB — BASIC METABOLIC PANEL
BUN: 16 mg/dL (ref 4–21)
CREATININE: 0.9 mg/dL (ref 0.6–1.3)
GLUCOSE: 178 mg/dL
POTASSIUM: 4.7 mmol/L (ref 3.4–5.3)
Sodium: 136 mmol/L — AB (ref 137–147)

## 2016-01-29 LAB — HEPATIC FUNCTION PANEL
ALT: 52 U/L — AB (ref 10–40)
AST: 124 U/L — AB (ref 14–40)
Alkaline Phosphatase: 237 U/L — AB (ref 25–125)
Bilirubin, Total: 2 mg/dL

## 2016-02-05 ENCOUNTER — Encounter: Payer: Self-pay | Admitting: Medical

## 2016-02-05 NOTE — Progress Notes (Signed)
[facility] 01/29/16 TL/CMA

## 2016-03-22 ENCOUNTER — Other Ambulatory Visit: Payer: Self-pay

## 2016-03-22 ENCOUNTER — Telehealth: Payer: Self-pay

## 2016-03-22 DIAGNOSIS — K7460 Unspecified cirrhosis of liver: Secondary | ICD-10-CM

## 2016-03-22 NOTE — Telephone Encounter (Signed)
Pts MR of liver rescheduled at [facility] 04/04/16@9am , pt to arrive there at 8:30am. Pt to be NPO after 5am. Appt letter mailed to pt as pt is difficult to reach by phone.

## 2016-03-28 ENCOUNTER — Ambulatory Visit (INDEPENDENT_AMBULATORY_CARE_PROVIDER_SITE_OTHER): Payer: 59 | Admitting: Medical

## 2016-03-28 ENCOUNTER — Encounter: Payer: Self-pay | Admitting: Medical

## 2016-03-28 VITALS — BP 153/89 | HR 69 | Temp 98.3°F | Ht 63.0 in | Wt 142.0 lb

## 2016-03-28 DIAGNOSIS — Z87898 Personal history of other specified conditions: Secondary | ICD-10-CM | POA: Diagnosis not present

## 2016-03-28 DIAGNOSIS — I10 Essential (primary) hypertension: Secondary | ICD-10-CM

## 2016-03-28 DIAGNOSIS — F1011 Alcohol abuse, in remission: Secondary | ICD-10-CM

## 2016-03-28 DIAGNOSIS — L089 Local infection of the skin and subcutaneous tissue, unspecified: Secondary | ICD-10-CM | POA: Diagnosis not present

## 2016-03-28 DIAGNOSIS — R21 Rash and other nonspecific skin eruption: Secondary | ICD-10-CM

## 2016-03-28 DIAGNOSIS — L299 Pruritus, unspecified: Secondary | ICD-10-CM | POA: Diagnosis not present

## 2016-03-28 LAB — AMMONIA: Ammonia: 56 umol/L — ABNORMAL HIGH (ref 11–35)

## 2016-03-28 MED ORDER — PREDNISONE 10 MG PO TABS: ORAL_TABLET | ORAL | 0 refills | Status: DC

## 2016-03-28 MED ORDER — HYDROXYZINE HCL 25 MG PO TABS: 25.0000 mg | ORAL_TABLET | Freq: Three times a day (TID) | ORAL | 0 refills | Status: DC | PRN

## 2016-03-28 MED ORDER — AMMONIUM LACTATE 12 % EX CREA: TOPICAL_CREAM | CUTANEOUS | 0 refills | Status: DC | PRN

## 2016-03-28 MED ORDER — DOXYCYCLINE HYCLATE 100 MG PO TABS: 100.0000 mg | ORAL_TABLET | Freq: Two times a day (BID) | ORAL | 0 refills | Status: DC

## 2016-03-28 MED FILL — DOXYCYCLINE HYCLATE 100 MG: 100 | 10 days supply | Qty: 20 | Fill #0

## 2016-03-28 MED FILL — hydrOXYzine HCL 25 MG TABS: 25 | 10 days supply | Qty: 30 | Fill #0

## 2016-03-28 MED FILL — AMMONIUM LACTATE 12% CREAM: 12 | 30 days supply | Qty: 385 | Fill #0

## 2016-03-28 MED FILL — predniSONE 10 MG TABS: 10 | 5 days supply | Qty: 15 | Fill #0

## 2016-03-28 NOTE — Progress Notes (Signed)
Subjective:    Patient ID: NOTNAMED SCHOLZ, male    DOB: 02-07-1961, 56 y.o.   MRN: [id number]  HPI  Pt in with some rashes on his arms and thorax. Wife saw just on his arms for one week. But rash on his thorax bilateral rib areas for 3 weeks. It itches a lot.   On review no suspicious exposures on review. Pt works as Training and development officer. Works in [employer]. Running heat in home more recently as well.  Pt states on review not drinking much alcohol compared to before. Pt states 2 shots of liquor a day. Wife agrees better than it has been in the past.  Pt blood pressure mild high today. Pt has not been checking his bp recently. Pt recently ran out of his blood pressure meds. He just got meds refilled yesterday. Restarted medications today. No cardiac or neurologic signs or symptoms.    Review of Systems  Constitutional: Negative for chills, fatigue and fever.  Respiratory: Negative for cough, shortness of breath and wheezing.   Cardiovascular: Negative for chest pain and palpitations.  Gastrointestinal: Negative for abdominal pain, diarrhea, nausea, rectal pain and vomiting.  Genitourinary: Negative for dysuria and flank pain.  Musculoskeletal: Negative for back pain, gait problem, neck pain and neck stiffness.  Skin: Positive for rash.  Neurological: Negative for dizziness and headaches.  Hematological: Negative for adenopathy. Does not bruise/bleed easily.  Psychiatric/Behavioral: Negative for behavioral problems and confusion.   Past Medical History:  Diagnosis Date  . Allergy   . Anemia   . Chronic kidney disease   . Cirrhosis ([place])   . Diabetes mellitus   . Helicobacter pylori gastritis    severe  . Hypertension   . Liver lesion   . Liver nodule 05/15/2011  . Sleep apnea   . Splenomegaly, congestive, chronic   . Thrombocytopenia ([place])   . Tubular adenoma of colon      Social History   Social History  . Marital status: Married    Spouse name: N/A  . Number of children: 2   . Years of education: N/A   Occupational History  . unemployed Financial trader   Social History Main Topics  . Smoking status: Never Smoker  . Smokeless tobacco: Never Used  . Alcohol use No  . Drug use: No  . Sexual activity: No   Other Topics Concern  . Not on file   Social History Narrative  . No narrative on file    Past Surgical History:  Procedure Laterality Date  . BONE MARROW BIOPSY    . TOOTH EXTRACTION      Family History  Problem Relation Age of Onset  . Colon cancer Neg Hx   . Esophageal cancer Neg Hx   . Rectal cancer Neg Hx   . Stomach cancer Neg Hx     No Known Allergies  Current Outpatient Prescriptions on File Prior to Visit  Medication Sig Dispense Refill  . amLODipine (NORVASC) 2.5 MG tablet TAKE ONE TABLET BY MOUTH ONCE DAILY 30 tablet 0  . carvedilol (COREG) 6.25 MG tablet Take 1 tablet (6.25 mg total) by mouth 2 (two) times daily with a meal. 60 tablet 3  . cetirizine (ZYRTEC) 10 MG tablet Take 10 mg by mouth daily as needed for allergies.     Marland Kitchen FOLIC ACID PO Take 1 tablet by mouth daily. Pt takes 400 mcg daily    . glucose blood test strip Use as instructed 100 each 12  No current facility-administered medications on file prior to visit.     BP (!) 153/89 Comment: x2  Pulse 69   Temp 98.3 F (36.8 C) (Oral)   Ht '5\' 3"'  (1.6 m)   Wt 142 lb (64.4 kg)   SpO2 100%   BMI 25.15 kg/m       Objective:   Physical Exam  General Mental Status- Alert. General Appearance- Not in acute distress.   Skin General: Color- Normal Color. Moisture- Normal Moisture.  Neck Carotid Arteries- Normal color. Moisture- Normal Moisture. No carotid bruits. No JVD.  Chest and Lung Exam Auscultation: Breath Sounds:-Normal.  Cardiovascular Auscultation:Rythm- Regular. Murmurs & Other Heart Sounds:Auscultation of the heart reveals- No Murmurs.  Abdomen Inspection:-Inspeection Normal. Palpation/Percussion:Note:No mass. Palpation and Percussion  of the abdomen reveal- Non Tender, Non Distended + BS, no rebound or guarding.   Neurologic Cranial Nerve exam:- CN III-XII intact(No nystagmus), symmetric smile. Strength:- 5/5 equal and symmetric strength both upper and lower extremities.  Skin- very large patched of red, dry skin with lichinified appearance both flanks. Size extimate 12 cm x 10 cm. Lt side has scab formed and warmth to palpation. Faint tender. No dc. Both triceps have red rash. But rt side has dry lichinified fee      Assessment & Plan:  For hx of alcohol abuse and rash/itching will get cmp, ggt and ammonia level. Try to cut back even further or stop completley if possible.  Allergic reaction vs dry skin may be cause as well. Rx lac-hydrin to apply twice daily, hydrrxoyzine for itch and 5 day taper prednisone.  Your left flank rash area may have bacterial infection secondary to severe itching. Rx doxycyline for 7 days.   Start rechecking your bp. Just restarted your meds this am and expect bp to come down in about 3-7 days.   Follow up in 7 days or as needed  Bhavesh Vazquez, [name], [employer]

## 2016-03-28 NOTE — Progress Notes (Signed)
Pre visit review using our clinic tool,if applicable. No additional management support is needed unless otherwise documented below in the visit note.  

## 2016-03-28 NOTE — Patient Instructions (Addendum)
For hx of alcohol abuse and rash/itching will get cmp, ggt and ammonia level. Try to cut back even further or stop completley if possible.  Allergic reaction vs dry skin may be cause as well. Rx lac-hydrin to apply twice daily, hydrxoyzine for itch and 5 day taper prednisone.  Your left flank rash area may have bacterial infection secondary to severe itching. Rx doxycyline for 7 days.   Start rechecking your bp. Just restarted your meds this am and expect bp to come down in about 3-7 days.   Follow up in 7 days or as needed

## 2016-03-29 LAB — COMPREHENSIVE METABOLIC PANEL
ALBUMIN: 3.5 g/dL (ref 3.5–5.2)
ALK PHOS: 233 U/L — AB (ref 39–117)
ALT: 41 U/L (ref 0–53)
AST: 116 U/L — AB (ref 0–37)
BILIRUBIN TOTAL: 1.7 mg/dL — AB (ref 0.2–1.2)
BUN: 15 mg/dL (ref 6–23)
CO2: 25 mEq/L (ref 19–32)
CREATININE: 0.83 mg/dL (ref 0.40–1.50)
Calcium: 9 mg/dL (ref 8.4–10.5)
Chloride: 102 mEq/L (ref 96–112)
GFR: 123.75 mL/min (ref >60.00)
Glucose, Bld: 114 mg/dL — ABNORMAL HIGH (ref 70–99)
Potassium: 4.1 mEq/L (ref 3.5–5.1)
SODIUM: 136 meq/L (ref 135–145)
TOTAL PROTEIN: 8.2 g/dL (ref 6.0–8.3)

## 2016-03-29 LAB — GAMMA GT: GGT: 1000 U/L — AB (ref 7–51)

## 2016-03-30 ENCOUNTER — Telehealth: Payer: Self-pay | Admitting: Medical

## 2016-03-30 MED ORDER — LACTULOSE 10 GM/15ML PO SOLN: 10.0000 g | Freq: Two times a day (BID) | ORAL | 0 refills | Status: DC

## 2016-04-02 NOTE — Telephone Encounter (Signed)
Opened to review 

## 2016-04-04 ENCOUNTER — Ambulatory Visit (HOSPITAL_COMMUNITY): Payer: 59

## 2016-04-15 ENCOUNTER — Ambulatory Visit (HOSPITAL_COMMUNITY): Admission: RE | Admit: 2016-04-15 | Payer: 59 | Source: Ambulatory Visit

## 2016-04-30 ENCOUNTER — Encounter: Payer: Self-pay | Admitting: *Deleted

## 2016-04-30 NOTE — Progress Notes (Signed)
Letter Mailed/SLS

## 2016-06-05 ENCOUNTER — Other Ambulatory Visit: Payer: Self-pay

## 2016-06-05 MED ORDER — HYDROXYZINE HCL 25 MG PO TABS: 25.0000 mg | ORAL_TABLET | Freq: Three times a day (TID) | ORAL | 0 refills | Status: DC | PRN

## 2016-06-05 MED FILL — hydrOXYzine HCL 25 MG TABS: 25 | 10 days supply | Qty: 30 | Fill #0

## 2016-06-05 NOTE — Telephone Encounter (Signed)
Pt is requesting refill for Hydroxyzine.  Please advise

## 2016-06-05 NOTE — Telephone Encounter (Signed)
Rx refill hydoxyzine sent to pharmacy but ask him to follow up with me this month.

## 2016-06-06 NOTE — Telephone Encounter (Signed)
No on doxy. He needs office visit.

## 2016-06-06 NOTE — Telephone Encounter (Signed)
Pt also requesting refill for Doxycyline. Please advise.

## 2016-06-11 ENCOUNTER — Ambulatory Visit: Payer: 59 | Admitting: Medical

## 2016-08-03 IMAGING — MR MR ABDOMEN WO/W CM
8 of 18 series · 19 of 48 positions shown · IV contrast (eovist)
Comparison: 01/14/2014 MRI abdomen.

CLINICAL DATA: 54-year-old male with alcoholic cirrhosis and
history of small hypervascular liver nodules suspicious for
dysplastic nodules presenting for follow-up.

EXAM:
MRI ABDOMEN WITHOUT AND WITH CONTRAST
TECHNIQUE: Multiplanar multisequence MR imaging of the abdomen was performed
both before and after the administration of intravenous contrast.
CONTRAST:  6 cc Eovist IV.

[Series 3: T2 · coronal · 5.0mm · 0.78mm/px · 2 of 43 slices shown (1 of 2)]
[im 1/43]
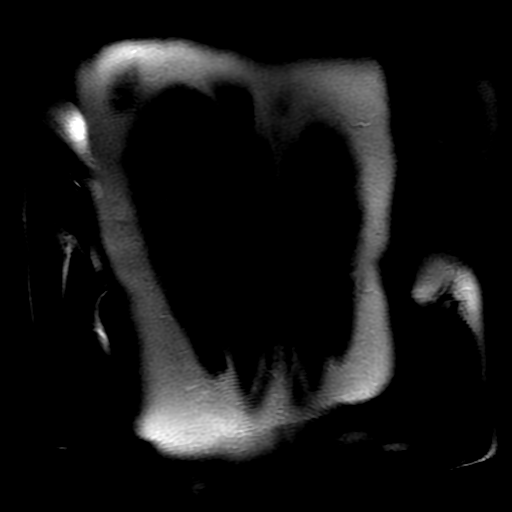
[im 43/43]
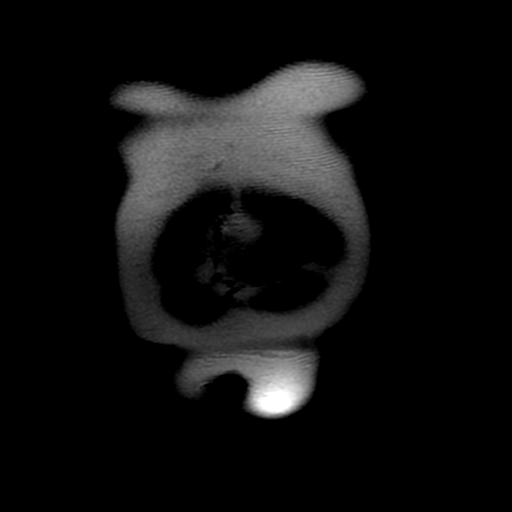

[Series 4: ax dualecho · axial · 5.0mm · 0.78mm/px · z∈[-88,+157]mm · 4 of 100 slices shown]
[im 1/100]
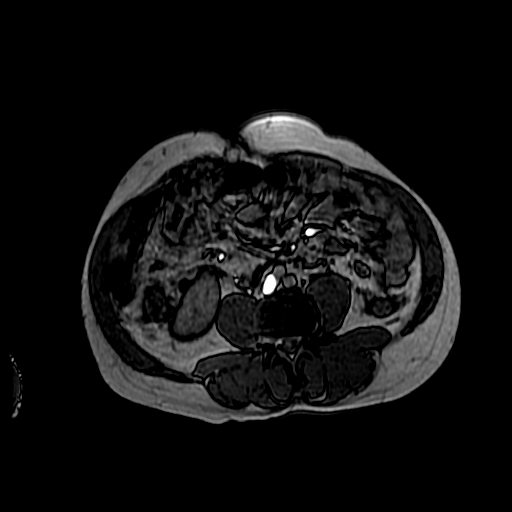
[im 34/100]
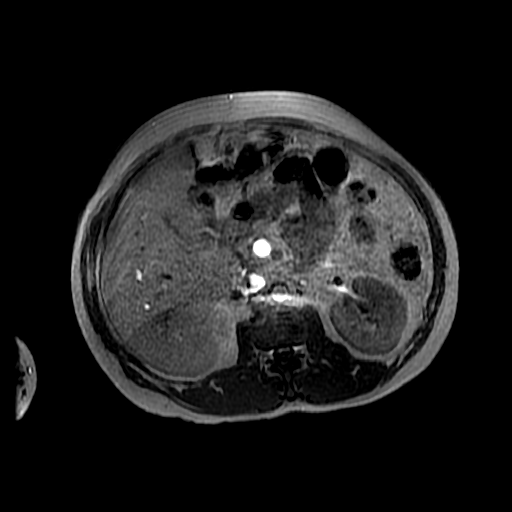
[im 67/100]
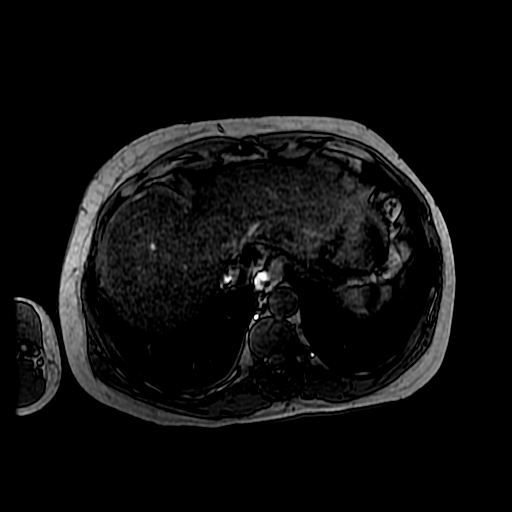
[im 100/100]
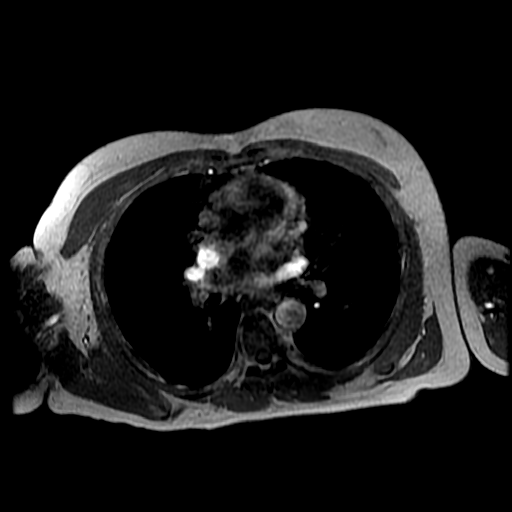

[Series 7: T2 fat-sat · axial · 5.0mm · 0.78mm/px · z∈[-98,+152]mm · 2 of 51 slices shown]
[im 1/51]
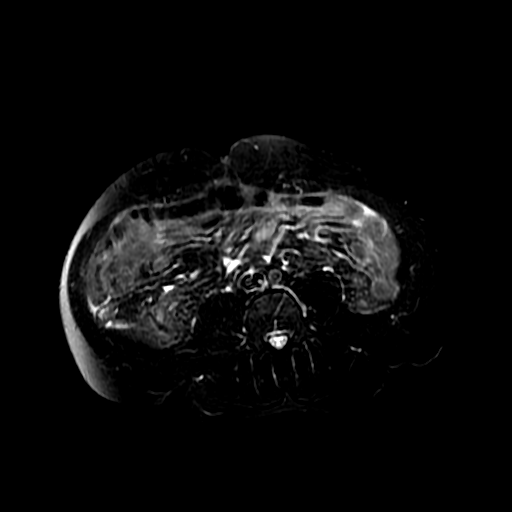
[im 51/51]
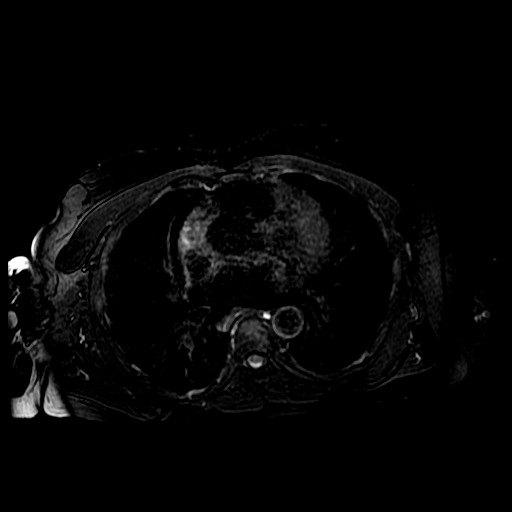

[Series 8: DWI b500 · axial · 6.0mm · 1.48mm/px · z∈[-92,+166]mm · 2 of 68 slices shown]
[im 1/68]
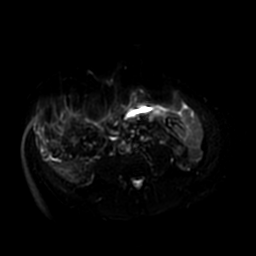
[im 68/68]
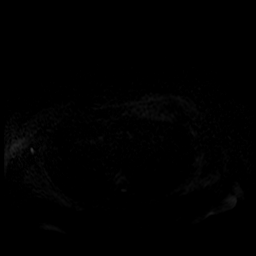

[Series 9: T2 · axial · 5.0mm · 0.78mm/px · z∈[-88,+157]mm · 2 of 50 slices shown (2 of 2)]
[im 1/50]
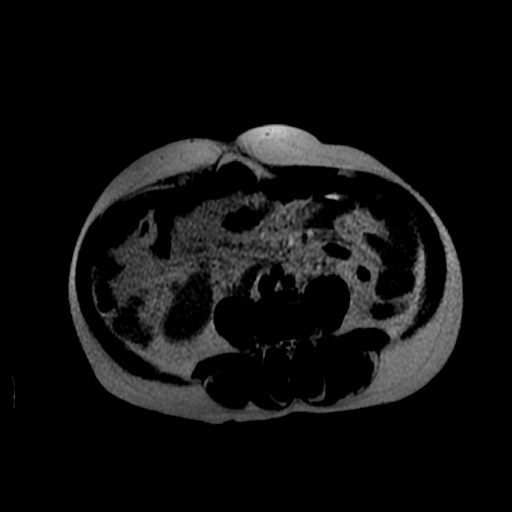
[im 50/50]
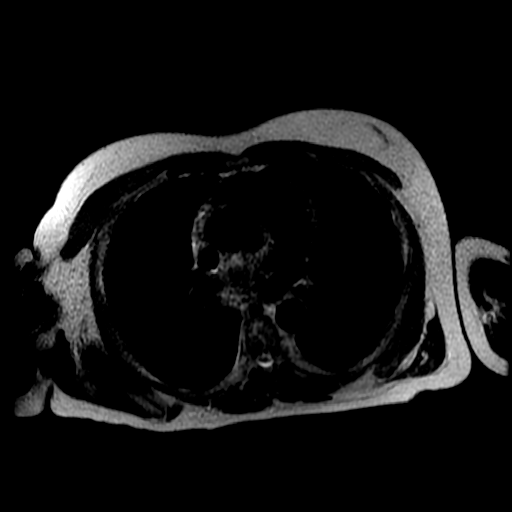

[Series 10: bSSFP fat-sat · axial · 5.0mm · 0.78mm/px · z∈[-88,+157]mm · 2 of 50 slices shown]
[im 1/50]
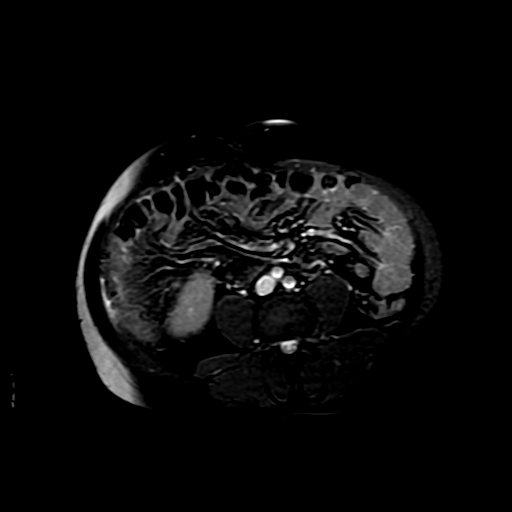
[im 50/50]
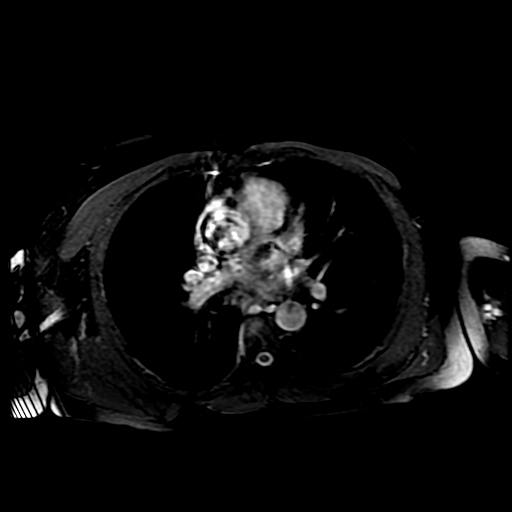

[Series 11: T1 dynamic · coronal · delayed · 6.0mm · 0.86mm/px · 3 of 80 slices shown (1 of 2)]
[im 1/80]
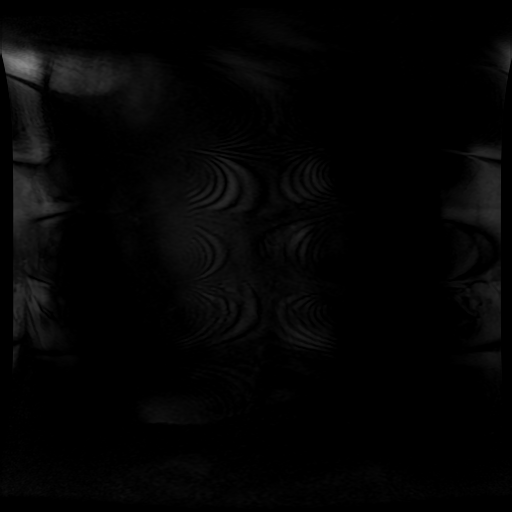
[im 40/80]
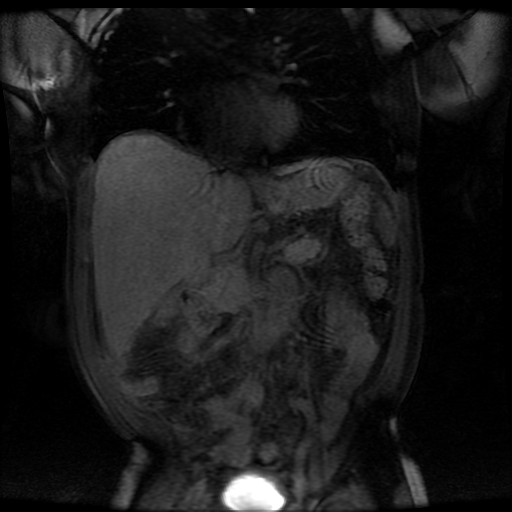
[im 80/80]
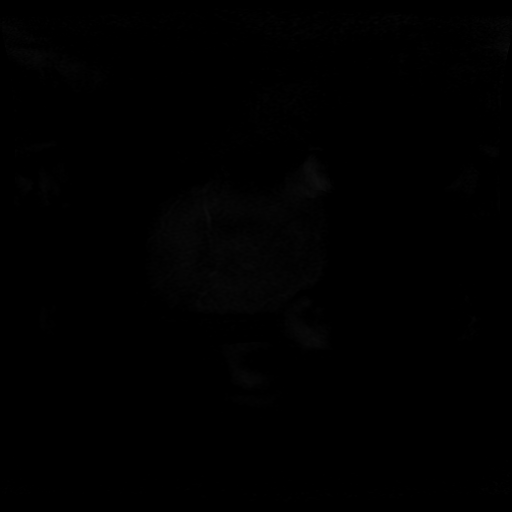

[Series 12: T1 dynamic · axial · delayed · 5.4mm · 0.78mm/px · z∈[-97,+25]mm · 2 of 92 slices shown (2 of 2)]
[im 1/92]
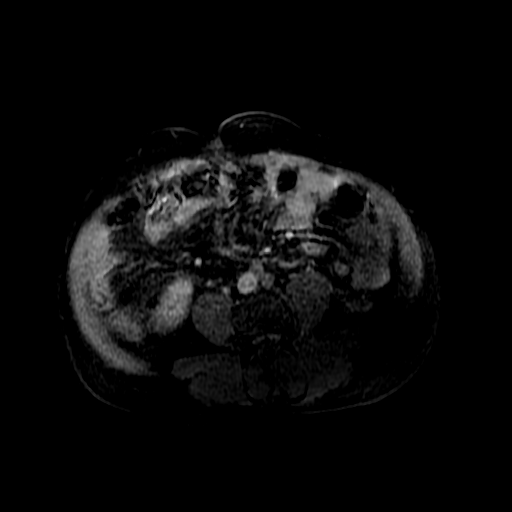
[im 46/92]
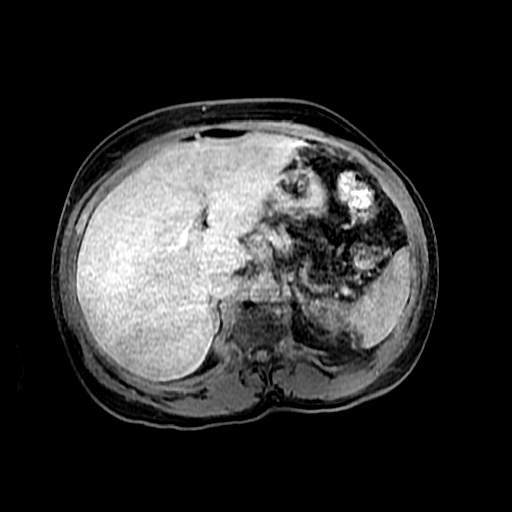

[19 of 48 positions shown; findings below may reference images not displayed]

FINDINGS: Lower chest: Stable mild scarring versus atelectasis in the basilar
right lower lobe. Stable symmetric gynecomastia.

Hepatobiliary: Diffusely irregular liver surface with mild relative
hypertrophy of the left liver and caudate lobes, in keeping with
cirrhosis. Moderate to severe diffuse hepatic steatosis. The
arterial phase postcontrast sequence is motion degraded. Re-
demonstrated are at least 4 scattered subcentimeter foci of arterial
phase hyperenhancement in the liver (which were better visualized on
the 01/14/2014 MRI study), largest measuring 0.8 cm in peripheral
segment 6 of the right liver lobe (series 501/image 63) and 0.8 cm
in segment 5 right liver lobe (501/53), which do not demonstrate
appreciable T2 hyperintensity or delayed contrast washout, and are
not appreciably changed in the interval, most in keeping with
dysplastic nodules. No new liver masses. Normal gallbladder, with no
cholelithiasis. No biliary ductal dilatation.

Pancreas: Normal, with no mass, duct dilation or pancreas divisum.

Spleen: Normal size spleen (craniocaudal splenic length 6.6 cm). No
splenic mass.

Adrenals/Urinary Tract: Normal adrenals. Subcentimeter simple renal
cyst in the lower right kidney. Otherwise normal kidneys, with no
hydronephrosis.

Stomach/Bowel: Collapsed and grossly normal stomach. Visualized
small and large bowel are normal caliber, with no bowel wall
thickening.

Vascular/Lymphatic: Stable moderate paraumbilical varix. Stable
moderate gastroesophageal varices. Patent portal, splenic, hepatic
and renal veins. No abdominal lymphadenopathy.

Other: Trace perihepatic ascites. No focal intra-abdominal fluid
collection.

Musculoskeletal: No aggressive appearing focal osseous lesions.
IMPRESSION: 1. Partially motion degraded study.
2. Cirrhosis. Interval stability of at least 4 scattered
subcentimeter liver foci of arterial phase hyperenhancement without
additional suspicious features, most in keeping with stable
dysplastic nodules. No new suspicious liver masses.
3. Stable moderate paraumbilical and gastroesophageal varices.
4. Trace perihepatic ascites.
5. Normal size spleen.

## 2017-04-09 ENCOUNTER — Ambulatory Visit: Payer: BLUE CROSS/BLUE SHIELD | Admitting: Medical

## 2017-04-09 ENCOUNTER — Telehealth: Payer: Self-pay | Admitting: Medical

## 2017-04-09 ENCOUNTER — Encounter: Payer: Self-pay | Admitting: Medical

## 2017-04-09 VITALS — BP 127/78 | HR 74 | Temp 98.1°F | Resp 16 | Ht 63.0 in | Wt 142.2 lb

## 2017-04-09 DIAGNOSIS — R748 Abnormal levels of other serum enzymes: Secondary | ICD-10-CM | POA: Diagnosis not present

## 2017-04-09 DIAGNOSIS — K130 Diseases of lips: Secondary | ICD-10-CM

## 2017-04-09 DIAGNOSIS — Z87898 Personal history of other specified conditions: Secondary | ICD-10-CM | POA: Diagnosis not present

## 2017-04-09 DIAGNOSIS — K1370 Unspecified lesions of oral mucosa: Secondary | ICD-10-CM

## 2017-04-09 DIAGNOSIS — D649 Anemia, unspecified: Secondary | ICD-10-CM

## 2017-04-09 DIAGNOSIS — D696 Thrombocytopenia, unspecified: Secondary | ICD-10-CM

## 2017-04-09 DIAGNOSIS — F1011 Alcohol abuse, in remission: Secondary | ICD-10-CM

## 2017-04-09 LAB — COMPREHENSIVE METABOLIC PANEL
ALT: 48 U/L (ref 0–53)
AST: 146 U/L — ABNORMAL HIGH (ref 0–37)
Albumin: 2.9 g/dL — ABNORMAL LOW (ref 3.5–5.2)
Alkaline Phosphatase: 172 U/L — ABNORMAL HIGH (ref 39–117)
BUN: 23 mg/dL (ref 6–23)
CALCIUM: 8.8 mg/dL (ref 8.4–10.5)
CHLORIDE: 101 meq/L (ref 96–112)
CO2: 25 meq/L (ref 19–32)
Creatinine, Ser: 1.06 mg/dL (ref 0.40–1.50)
GFR: 92.96 mL/min (ref >60.00)
Glucose, Bld: 224 mg/dL — ABNORMAL HIGH (ref 70–99)
POTASSIUM: 4.7 meq/L (ref 3.5–5.1)
Sodium: 135 mEq/L (ref 135–145)
Total Bilirubin: 2.1 mg/dL — ABNORMAL HIGH (ref 0.2–1.2)
Total Protein: 8 g/dL (ref 6.0–8.3)

## 2017-04-09 LAB — CBC WITH DIFFERENTIAL/PLATELET
BASOS PCT: 0.2 % (ref 0.0–3.0)
Basophils Absolute: 0 10*3/uL (ref 0.0–0.1)
EOS ABS: 0 10*3/uL (ref 0.0–0.7)
Eosinophils Relative: 0 % (ref 0.0–5.0)
HEMATOCRIT: 32.3 % — AB (ref 39.0–52.0)
Hemoglobin: 10.7 g/dL — ABNORMAL LOW (ref 13.0–17.0)
LYMPHS PCT: 13.9 % (ref 12.0–46.0)
Lymphs Abs: 1 10*3/uL (ref 0.7–4.0)
MCHC: 33 g/dL (ref 30.0–36.0)
MCV: 92.3 fl (ref 78.0–100.0)
Monocytes Absolute: 1 10*3/uL (ref 0.1–1.0)
Monocytes Relative: 13.7 % — ABNORMAL HIGH (ref 3.0–12.0)
NEUTROS ABS: 5.3 10*3/uL (ref 1.4–7.7)
Neutrophils Relative %: 72.2 % (ref 43.0–77.0)
PLATELETS: 69 10*3/uL — AB (ref 150.0–400.0)
RBC: 3.5 Mil/uL — ABNORMAL LOW (ref 4.22–5.81)
RDW: 20 % — AB (ref 11.5–15.5)
WBC: 7.3 10*3/uL (ref 4.0–10.5)

## 2017-04-09 LAB — VITAMIN B12: VITAMIN B 12: 1148 pg/mL — AB (ref 211–911)

## 2017-04-09 LAB — GAMMA GT: GGT: 1261 U/L — ABNORMAL HIGH (ref 7–51)

## 2017-04-09 NOTE — Telephone Encounter (Signed)
Patient has moderate anemia, increased liver enzymes, increased GGT level and low platelets.  Anemia is a little worse than last time.  These are all consistent with his prior labs.  Still recommend he stop drinking any alcohol at all.  I have put in referral to Dr. Marin Olp regarding his low platelets and anemia.  Would you call patient and document he was advised.  I accidentally clicked reviewed and did not tag comments to his note.  Is due anyway I can copy this note to his lab?  Or can the lab resend me all the labs that were drawn tomorrow?.  Can they do that when the result is B1 level?

## 2017-04-09 NOTE — Patient Instructions (Addendum)
For your history of alcohol abuse and increased liver enzymes, I want to get CMP and GGT today.  Also with a history of low platelets I want to repeat CBC.  Please try to abstain from alcohol use.  With your 2 weeks of large scab lower lip and history of growth left side bucca mucosa, I want to get you in with ENT.  You might need biopsies of both areas.  I considered giving you an antiviral medication today. However he denies any blister formation initially and scab is already present for 2 weeks.(So antiviral treatment at this point even if it were a cold sore he probably would not be beneficial.)  Will follow your labs and see if platelets are low.  I think the platelets are probably low due to alcohol use/abuse history.  However might refer to hematologist since you mentioned that one specialist indicated she might have another underlying problem that effect.  Follow-up in 7 days or as needed.

## 2017-04-09 NOTE — Telephone Encounter (Signed)
Open to review labs and send note to [place] regarding those labs reviewed today.

## 2017-04-09 NOTE — Progress Notes (Signed)
Subjective:    Patient ID: Arthur Woodard, male    DOB: December 08, 1960, 57 y.o.   MRN: [id number]  HPI  Pt in for evaluation of his lower lip area. He state he had large crack to his lower lip rt side. Then scab formed. But he also states in past he got large blister formation and was given prednisone. Unclear what diagnosis was given. The specialist told him rare diagnosis. Saw some clinic 8 years ago on [address] but he can't remember the name.  Pt also has growth inside mouth left side buccal mucosa. Found on exam. He states he has referral to oral surgeon.   Pt has history of alcohol abuse. He has not been seen in some time. No recent labs done. Hx of liver enzyme elevation.  Pt saw hematologist long time ago. Some speculation per pt low platelets from other cause other than his alcohol abuse.  Still drinking alcohol. Pt denies getting drunk. His wife seems to say he drinks.   Review of Systems  Constitutional: Negative for chills, fatigue and fever.  Respiratory: Negative for chest tightness, shortness of breath and wheezing.   Cardiovascular: Negative for chest pain and palpitations.  Gastrointestinal: Negative for abdominal distention, abdominal pain, blood in stool, constipation and nausea.  Musculoskeletal: Negative for back pain and gait problem.  Skin: Negative for rash.       Lipid and fecal mucosal lesion  Neurological: Negative for dizziness, syncope, speech difficulty, weakness, light-headedness, numbness and headaches.  Hematological: Negative for adenopathy. Does not bruise/bleed easily.  Psychiatric/Behavioral: Negative for behavioral problems, confusion, dysphoric mood and hallucinations.    Past Medical History:  Diagnosis Date  . Allergy   . Anemia   . Chronic kidney disease   . Cirrhosis ([place])   . Diabetes mellitus   . Helicobacter pylori gastritis    severe  . Hypertension   . Liver lesion   . Liver nodule 05/15/2011  . Sleep apnea   . Splenomegaly,  congestive, chronic   . Thrombocytopenia ([place])   . Tubular adenoma of colon      Social History   Socioeconomic History  . Marital status: Married    Spouse name: Not on file  . Number of children: 2  . Years of education: Not on file  . Highest education level: Not on file  Social Needs  . Financial resource strain: Not on file  . Food insecurity - worry: Not on file  . Food insecurity - inability: Not on file  . Transportation needs - medical: Not on file  . Transportation needs - non-medical: Not on file  Occupational History  . Occupation: unemployed    Employer: [employer]  Tobacco Use  . Smoking status: Never Smoker  . Smokeless tobacco: Never Used  Substance and Sexual Activity  . Alcohol use: No    Alcohol/week: 0.0 oz  . Drug use: No  . Sexual activity: No  Other Topics Concern  . Not on file  Social History Narrative  . Not on file    Past Surgical History:  Procedure Laterality Date  . BONE MARROW BIOPSY    . TOOTH EXTRACTION      Family History  Problem Relation Age of Onset  . Colon cancer Neg Hx   . Esophageal cancer Neg Hx   . Rectal cancer Neg Hx   . Stomach cancer Neg Hx     No Known Allergies  Current Outpatient Medications on File Prior to Visit  Medication  Sig Dispense Refill  . amLODipine (NORVASC) 2.5 MG tablet TAKE ONE TABLET BY MOUTH ONCE DAILY 30 tablet 0  . ammonium lactate (LAC-HYDRIN) 12 % cream Apply topically as needed for dry skin. 385 g 0  . carvedilol (COREG) 6.25 MG tablet Take 1 tablet (6.25 mg total) by mouth 2 (two) times daily with a meal. 60 tablet 3  . cetirizine (ZYRTEC) 10 MG tablet Take 10 mg by mouth daily as needed for allergies.     Marland Kitchen doxycycline (VIBRA-TABS) 100 MG tablet Take 1 tablet (100 mg total) by mouth 2 (two) times daily. Can give caps or generic 20 tablet 0  . FOLIC ACID PO Take 1 tablet by mouth daily. Pt takes 400 mcg daily    . glucose blood test strip Use as instructed 100 each 12  .  hydrOXYzine (ATARAX/VISTARIL) 25 MG tablet Take 1 tablet (25 mg total) by mouth 3 (three) times daily as needed for itching. 30 tablet 0  . lactulose (CHRONULAC) 10 GM/15ML solution Take 15 mLs (10 g total) by mouth 2 (two) times daily. 300 mL 0  . predniSONE (DELTASONE) 10 MG tablet 5 TAB PO DAY 1 4 TAB PO DAY 2 3 TAB PO DAY 3 2 TAB PO DAY 4 1 TAB PO DAY 5 15 tablet 0   No current facility-administered medications on file prior to visit.     BP 127/78   Pulse 74   Temp 98.1 F (36.7 C) (Oral)   Resp 16   Ht 5' 3" (1.6 m)   Wt 142 lb 3.2 oz (64.5 kg)   SpO2 100%   BMI 25.19 kg/m       Objective:   Physical Exam  General  Mental Status - Alert. General Appearance - Well groomed. Not in acute distress.  Skin Rashes- No Rashes.  HEENT Head- Normal. Ear Auditory Canal - Left- Normal. Right - Normal.Tympanic Membrane- Left- Normal. Right- Normal. Eye Sclera/Conjunctiva- Left- Normal. Right- Normal. Nose & Sinuses Nasal Mucosa- Left-  Boggy and Congested. Right-  Boggy and  Congested.Bilateral maxillary and frontal sinus pressure. Mouth & Throat Lips-lower lip right side is a very large scab about 8 mm by 2 cm.  No blister seen on inspection.  The corner edge of this large scab bleeding.    Buccal Mucosa-left side bucca mucosa appears to be growth/lesion mid aspect.  Size of this growth is about 5 mm x 2 mm.  Oropharynx- No Discharge or Erythema. Tonsils: Characteristics- Bilateral- No Erythema or Congestion. Size/Enlargement- Bilateral- No enlargement. Discharge- bilateral-None.  Lips-lower lip right side is a very large scab about 8 mm by 2 cm.  No blister seen on inspection.  The corner edge of this large scab bleeding.  Neck Neck- Supple. No Masses.   Chest and Lung Exam Auscultation: Breath Sounds:-Clear even and unlabored.  Cardiovascular Auscultation:Rythm- Regular, rate and rhythm. Murmurs & Other Heart Sounds:Ausculatation of the heart reveal- No  Murmurs.  Lymphatic Head & Neck General Head & Neck Lymphatics: Bilateral: Description- No Localized lymphadenopathy.  Abdomen-soft, nontender tender, positive bowel sounds no rebound or guarding.  Abdomen feels a little bit distended.     Assessment & Plan:  For your history of alcohol abuse and increased liver enzymes, I want to get CMP and GGT today.  Also with a history of low platelets I want to repeat CBC.  Please try to abstain from alcohol use.  With your 2 weeks of large scab lower lip and history of growth left  side bucca mucosa, I want to get you in with ENT.  You might need biopsies of both areas.  I considered giving you an antiviral medication today.  However he denies any blister formation initially and scab is already present for 2 weeks.(So antiviral treatment at this point even if it were a cold sore he probably would not be beneficial.)  Will follow your labs and see if platelets are low.  I think the platelets are probably low due to alcohol use/abuse history.  However might refer to hematologist since you mentioned that one specialist indicated she might have another underlying problem that effect.  Follow-up in 7 days or as needed.  Mackie Pai, PA-C

## 2017-04-09 NOTE — Telephone Encounter (Signed)
Referral to Dr. Ennever placed. 

## 2017-04-10 NOTE — Telephone Encounter (Signed)
Patient notified and verbalized understanding. I have mailed letter to patient regarding results as requested.

## 2017-04-10 NOTE — Telephone Encounter (Signed)
Left pt a message to call back. 

## 2017-04-14 LAB — VITAMIN B1: Vitamin B1 (Thiamine): <6 nmol/L — ABNORMAL LOW (ref 8–30)

## 2017-04-17 ENCOUNTER — Ambulatory Visit: Payer: Self-pay

## 2017-04-17 NOTE — Telephone Encounter (Signed)
Patient called saying "I saw [name] last week for this sore on my lip. He wanted to get blood work drawn before giving me prednisone. That was done and the results are back. Now I just want to know if he can prescribe the prednisone for my lip." I asked about making an appointment to see [name], he says "I don't need to see him until I see the hematologist. If I need to see him after that, I will call back to make the appointment." I advised this request would be sent over to [facility] and someone will be in touch with his recommendation, the patient verbalized understanding.  Pharmacy:  [facility] [address] [phone]   Reason for Disposition . [1] Follow-up call to recent contact AND [2] information only call, no triage required  Answer Assessment - Initial Assessment Questions 1. REASON FOR CALL or QUESTION: "What is your reason for calling today?" or "How can I best help you?" or "What question do you have that I can help answer?"     I wanted to get some prednisone for my lip swelling  Protocols used: INFORMATION ONLY CALL-A-AH

## 2017-04-18 ENCOUNTER — Telehealth: Payer: Self-pay | Admitting: Hematology & Oncology

## 2017-04-18 ENCOUNTER — Telehealth: Payer: Self-pay | Admitting: Medical

## 2017-04-18 ENCOUNTER — Telehealth: Payer: Self-pay

## 2017-04-18 NOTE — Telephone Encounter (Signed)
lvm for pt to return call to office to inform of new patient appt 05/02/17 at 130 pm

## 2017-04-18 NOTE — Telephone Encounter (Signed)
I placed referral to hematologist. Pt wants to know status. Will you investigate. He wants a call from me but too busy today. Will you help me out.

## 2017-04-18 NOTE — Telephone Encounter (Signed)
Copied from [place] [id number]. Topic: Quick Communication - See Telephone Encounter >> Apr 18, 2017  3:39 PM Vernona Rieger wrote: CRM for notification. See Telephone encounter for:   04/18/17.  Pt wants to know if [name] is going to give him anything for his lip. See [facility] triage note.  [phone]

## 2017-04-18 NOTE — Telephone Encounter (Signed)
Copied from [place]. Topic: General - Other >> Apr 18, 2017 10:52 AM [name], NT wrote: Patient is requesting Arthur Woodard give him a call. It is in regards to not hearing from the hematologist. Please contact patient.

## 2017-04-18 NOTE — Telephone Encounter (Signed)
FYI

## 2017-04-18 NOTE — Telephone Encounter (Signed)
Oncology, states they are waiting for Dr. Jorje Guild to review chart, appt has not been made yet

## 2017-04-18 NOTE — Telephone Encounter (Signed)
Gwen- can you check status of referral?

## 2017-04-19 MED ORDER — FAMCICLOVIR 500 MG PO TABS: 500.0000 mg | ORAL_TABLET | Freq: Three times a day (TID) | ORAL | 0 refills | Status: DC

## 2017-04-19 NOTE — Telephone Encounter (Signed)
Will you ask them to review the referral since pt calling various times.

## 2017-04-19 NOTE — Telephone Encounter (Signed)
Pt had large lip blister with easy bleeding/partial scab formation. He has low platelets. I thought this was complication of low platelets. Wanted Dr. Marin Olp opinion. I did send famvir this weekend in event this was atypical large cold sore.(though this is doubtful). I think first thing to do is get in him with Dr. Marin Olp to evaluate low platlets. If that is further delayed then ask him to reschedule with me this week. I had considered referral to ENT to biopsy area if it persists/change. But if I were to refer to ENT would like to review area.

## 2017-04-21 NOTE — Telephone Encounter (Signed)
Patient has been misunderstanding what I said regarding his platelets.  The platelets being low are likely related to his alcohol abuse history.  I had mentioned that I will try to get a referral to Dr. Marin Olp to see if he has another underlying disorder causing platelets to be low as he mentioned other provider in the past had indicated potential disorder other than alcohol abuse.  I have asked for referral with hematologist since I do not want to write prednisone or other medications unless it is found he has other coexisting disorder affecting the platelets other than the alcohol abuse.  So I have placed a referral to Dr. Marin Olp. Will you get [place] to follow ask Dr. Marin Olp office again on status of referral?

## 2017-04-21 NOTE — Telephone Encounter (Signed)
Left pt a message and sent mychart message.

## 2017-04-21 NOTE — Telephone Encounter (Signed)
Hematology appt scheduled 05/02/2017.

## 2017-04-28 NOTE — Telephone Encounter (Signed)
Patient has appointment with oncology on 05-02-17

## 2017-04-29 ENCOUNTER — Encounter: Payer: Self-pay | Admitting: Medical

## 2017-04-30 ENCOUNTER — Telehealth: Payer: Self-pay | Admitting: Medical

## 2017-04-30 NOTE — Telephone Encounter (Signed)
Pt has appointment with Dr Marin Olp this Friday. But I also put in Ent referral. Can you please get him in this coming week. Give me update by Monday please.  Thanks,

## 2017-05-01 ENCOUNTER — Other Ambulatory Visit: Payer: Self-pay | Admitting: Family

## 2017-05-01 DIAGNOSIS — D696 Thrombocytopenia, unspecified: Secondary | ICD-10-CM

## 2017-05-02 ENCOUNTER — Inpatient Hospital Stay: Payer: BLUE CROSS/BLUE SHIELD

## 2017-05-02 ENCOUNTER — Inpatient Hospital Stay: Payer: BLUE CROSS/BLUE SHIELD | Attending: Family | Admitting: Family

## 2017-05-02 ENCOUNTER — Encounter: Payer: Self-pay | Admitting: Family

## 2017-05-02 ENCOUNTER — Other Ambulatory Visit: Payer: Self-pay

## 2017-05-02 VITALS — BP 126/69 | HR 73 | Temp 98.4°F | Resp 16 | Wt 139.0 lb

## 2017-05-02 DIAGNOSIS — E1122 Type 2 diabetes mellitus with diabetic chronic kidney disease: Secondary | ICD-10-CM | POA: Diagnosis not present

## 2017-05-02 DIAGNOSIS — E1165 Type 2 diabetes mellitus with hyperglycemia: Secondary | ICD-10-CM | POA: Insufficient documentation

## 2017-05-02 DIAGNOSIS — K7030 Alcoholic cirrhosis of liver without ascites: Secondary | ICD-10-CM | POA: Diagnosis not present

## 2017-05-02 DIAGNOSIS — I129 Hypertensive chronic kidney disease with stage 1 through stage 4 chronic kidney disease, or unspecified chronic kidney disease: Secondary | ICD-10-CM | POA: Insufficient documentation

## 2017-05-02 DIAGNOSIS — N189 Chronic kidney disease, unspecified: Secondary | ICD-10-CM | POA: Diagnosis not present

## 2017-05-02 DIAGNOSIS — D696 Thrombocytopenia, unspecified: Secondary | ICD-10-CM | POA: Insufficient documentation

## 2017-05-02 LAB — CBC WITH DIFFERENTIAL (CANCER CENTER ONLY)
Basophils Absolute: 0 10*3/uL (ref 0.0–0.1)
Basophils Relative: 1 %
EOS ABS: 0.1 10*3/uL (ref 0.0–0.5)
Eosinophils Relative: 3 %
HCT: 30 % — ABNORMAL LOW (ref 38.7–49.9)
HEMOGLOBIN: 10.2 g/dL — AB (ref 13.0–17.1)
Lymphocytes Relative: 19 %
Lymphs Abs: 0.8 10*3/uL — ABNORMAL LOW (ref 0.9–3.3)
MCH: 32.2 pg (ref 28.0–33.4)
MCHC: 34 g/dL (ref 32.0–35.9)
MCV: 94.6 fL (ref 82.0–98.0)
Monocytes Absolute: 0.7 10*3/uL (ref 0.1–0.9)
Monocytes Relative: 17 %
NEUTROS PCT: 60 %
Neutro Abs: 2.5 10*3/uL (ref 1.5–6.5)
PLATELETS: 89 10*3/uL — AB (ref 145–400)
RBC: 3.17 MIL/uL — AB (ref 4.20–5.70)
RDW: 23.7 % — ABNORMAL HIGH (ref 11.1–15.7)
WBC: 4.2 10*3/uL (ref 4.0–10.0)

## 2017-05-02 LAB — SAVE SMEAR

## 2017-05-02 LAB — CMP (CANCER CENTER ONLY)
ALBUMIN: 2.7 g/dL — AB (ref 3.5–5.0)
ALK PHOS: 223 U/L — AB (ref 26–84)
ALT: 36 U/L (ref 10–47)
AST: 95 U/L — ABNORMAL HIGH (ref 11–38)
Anion gap: 5 (ref 5–15)
BUN: 14 mg/dL (ref 7–22)
CHLORIDE: 104 mmol/L (ref 98–108)
CO2: 26 mmol/L (ref 18–33)
Calcium: 8.2 mg/dL (ref 8.0–10.3)
Creatinine: 0.7 mg/dL (ref 0.60–1.20)
GLUCOSE: 261 mg/dL — AB (ref 73–118)
Potassium: 3.7 mmol/L (ref 3.3–4.7)
SODIUM: 135 mmol/L (ref 128–145)
Total Bilirubin: 4 mg/dL (ref 0.2–1.6)
Total Protein: 7.5 g/dL (ref 6.4–8.1)

## 2017-05-02 LAB — LACTATE DEHYDROGENASE: LDH: 360 U/L — AB (ref 125–245)

## 2017-05-02 NOTE — Progress Notes (Signed)
Hematology/Oncology Consultation   Name: Arthur Woodard      MRN: [id number]    Location: Room/bed info not found  Date: 05/02/2017 Time:2:26 PM   REFERRING PHYSICIAN: Evern Core, PA-C  REASON FOR CONSULT: Thrombocytopenia    DIAGNOSIS:  Thrombocytopenia Alcohol induced cirrhosis  HISTORY OF PRESENT ILLNESS: Arthur Woodard is a very pleasant 57 yo gentleman from [place] ([place] the [place] 20+ [place] ago) with thrombocytopenia. His platelet count today is 89, Hgb 10.2, MCV 94 and WBC count 4.2.  He has history of alcohol abuse. He was drinking 4+ shots of liquor daily but has since cut back to 2 a day.  MRI of the abdomen in 2016 showed cirrhosis as well as stable moderate paraumbilical and gastroesophageal varices.  No episodes of bleeding, no bruising or petechiae.  No personal or familial history of sickle cell disease or trait.  No personal or familial history of cancer that he is aware of.  He had malaria as a young man after going back to [place] for his mother's funeral.  He is diabetic and is not currently being treated.  He has a recurring large sore on the right side of his bottom lip that is being followed by his PCP. It looks as though Famvir did not help and Prednisone worked but caused his blood sugars to elevate so he stopped taking. He is being referred to [facility]NT for further work up.  No surgical history.  He denies any issue with frequent infection. No fever, chills, n/v, cough, rash, dizziness, SOB, chest pain, palpitations, abdominal pain or changes in bowel or bladder habits.  No lymphadenopathy noted on exam.  No swelling, tenderness, numbness or tingling in his extremities. No c/o pain.  He does not smoke.  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  He likes to go for walks for exercise.   ROS: All other 10 point review of systems is negative.   PAST MEDICAL HISTORY:   Past Medical History:  Diagnosis Date  . Allergy   . Anemia   . Chronic  kidney disease   . Cirrhosis ([place])   . Diabetes mellitus   . Helicobacter pylori gastritis    severe  . Hypertension   . Liver lesion   . Liver nodule 05/15/2011  . Sleep apnea   . Splenomegaly, congestive, chronic   . Thrombocytopenia ([place])   . Tubular adenoma of colon     ALLERGIES: No Known Allergies    MEDICATIONS:  Current Outpatient Medications on File Prior to Visit  Medication Sig Dispense Refill  . amLODipine (NORVASC) 2.5 MG tablet TAKE ONE TABLET BY MOUTH ONCE DAILY 30 tablet 0  . ammonium lactate (LAC-HYDRIN) 12 % cream Apply topically as needed for dry skin. 385 g 0  . carvedilol (COREG) 6.25 MG tablet Take 1 tablet (6.25 mg total) by mouth 2 (two) times daily with a meal. 60 tablet 3  . cetirizine (ZYRTEC) 10 MG tablet Take 10 mg by mouth daily as needed for allergies.     Marland Kitchen doxycycline (VIBRA-TABS) 100 MG tablet Take 1 tablet (100 mg total) by mouth 2 (two) times daily. Can give caps or generic 20 tablet 0  . famciclovir (FAMVIR) 500 MG tablet Take 1 tablet (500 mg total) by mouth 3 (three) times daily. 21 tablet 0  . FOLIC ACID PO Take 1 tablet by mouth daily. Pt takes 400 mcg daily    . glucose blood test strip Use as instructed  100 each 12  . hydrOXYzine (ATARAX/VISTARIL) 25 MG tablet Take 1 tablet (25 mg total) by mouth 3 (three) times daily as needed for itching. 30 tablet 0  . lactulose (CHRONULAC) 10 GM/15ML solution Take 15 mLs (10 g total) by mouth 2 (two) times daily. 300 mL 0  . predniSONE (DELTASONE) 10 MG tablet 5 TAB PO DAY 1 4 TAB PO DAY 2 3 TAB PO DAY 3 2 TAB PO DAY 4 1 TAB PO DAY 5 15 tablet 0   No current facility-administered medications on file prior to visit.      PAST SURGICAL HISTORY Past Surgical History:  Procedure Laterality Date  . BONE MARROW BIOPSY    . TOOTH EXTRACTION      FAMILY HISTORY: Family History  Problem Relation Age of Onset  . Colon cancer Neg Hx   . Esophageal cancer Neg Hx   . Rectal cancer Neg Hx   . Stomach  cancer Neg Hx     SOCIAL HISTORY:  reports that  has never smoked. he has never used smokeless tobacco. He reports that he does not drink alcohol or use drugs.  PERFORMANCE STATUS: The patient's performance status is 0 - Asymptomatic  PHYSICAL EXAM: Most Recent Vital Signs: There were no vitals taken for this visit. BP 126/69 (BP Location: Right Leg, Patient Position: Sitting)   Pulse 73   Temp 98.4 F (36.9 C) (Oral)   Resp 16   Wt 139 lb (63 kg)   SpO2 100%   BMI 24.62 kg/m    General Appearance:    Alert, cooperative, no distress, appears stated age  Head:    Normocephalic, without obvious abnormality, atraumatic  Eyes:    PERRL, conjunctiva/corneas clear, EOM's intact, fundi    benign, both eyes        Throat:   bottom right lip sore present, mucosa, and tongue normal; teeth and gums normal  Neck:   Supple, symmetrical, trachea midline, no adenopathy;    thyroid:  no enlargement/tenderness/nodules; no carotid   bruit or JVD  Back:     Symmetric, no curvature, ROM normal, no CVA tenderness  Lungs:     Clear to auscultation bilaterally, respirations unlabored  Chest Wall:    No tenderness or deformity   Heart:    Regular rate and rhythm, S1 and S2 normal, no murmur, rub   or gallop     Abdomen:     Soft, non-tender, bowel sounds active all four quadrants,    no masses, no organomegaly        Extremities:   Extremities normal, atraumatic, no cyanosis or edema  Pulses:   2+ and symmetric all extremities  Skin:   Skin color, texture, turgor normal, no rashes or lesions  Lymph nodes:   Cervical, supraclavicular, and axillary nodes normal  Neurologic:   CNII-XII intact, normal strength, sensation and reflexes    throughout    LABORATORY DATA:  Results for orders placed or performed in visit on 05/02/17 (from the past 48 hour(s))  CBC with Differential (Cancer Center Only)     Status: Abnormal   Collection Time: 05/02/17  1:18 PM  Result Value Ref Range   WBC Count  4.2 4.0 - 10.0 K/uL   RBC 3.17 (L) 4.20 - 5.70 MIL/uL   Hemoglobin 10.2 (L) 13.0 - 17.1 g/dL   HCT 30.0 (L) 38.7 - 49.9 %   MCV 94.6 82.0 - 98.0 fL   MCH 32.2 28.0 - 33.4 pg  MCHC 34.0 32.0 - 35.9 g/dL   RDW 23.7 (H) 11.1 - 15.7 %   Platelet Count 89 (L) 145 - 400 K/uL   Neutrophils Relative % 60 %   Neutro Abs 2.5 1.5 - 6.5 K/uL   Lymphocytes Relative 19 %   Lymphs Abs 0.8 (L) 0.9 - 3.3 K/uL   Monocytes Relative 17 %   Monocytes Absolute 0.7 0.1 - 0.9 K/uL   Eosinophils Relative 3 %   Eosinophils Absolute 0.1 0.0 - 0.5 K/uL   Basophils Relative 1 %   Basophils Absolute 0.0 0.0 - 0.1 K/uL    Comment: Performed at [facility] Lab at [facility], [address], [place], [place] [address]  CMP ([place] [facility] only)     Status: Abnormal   Collection Time: 05/02/17  1:18 PM  Result Value Ref Range   Sodium 135 128 - 145 mmol/L   Potassium 3.7 3.3 - 4.7 mmol/L   Chloride 104 98 - 108 mmol/L   CO2 26 18 - 33 mmol/L   Glucose, Bld 261 (H) 73 - 118 mg/dL   BUN 14 7 - 22 mg/dL   Creatinine 0.70 0.60 - 1.20 mg/dL   Calcium 8.2 8.0 - 10.3 mg/dL   Total Protein 7.5 6.4 - 8.1 g/dL   Albumin 2.7 (L) 3.5 - 5.0 g/dL   AST 95 (H) 11 - 38 U/L   ALT 36 10 - 47 U/L   Alkaline Phosphatase 223 (H) 26 - 84 U/L   Total Bilirubin 4.0 (HH) 0.2 - 1.6 mg/dL    Comment: CRITICAL RESULT CALLED TO, READ BACK BY AND VERIFIED WITH: Dalary Hollar C. NP 2 PM 05/02/17  SDD    Anion gap 5 5 - 15    Comment: Performed at [facility] Lab at [facility], [address] [place], [place] [address]      RADIOGRAPHY: No results found.     PATHOLOGY: None  ASSESSMENT/PLAN: Mr. Mallis is a very pleasant 57 yo gentleman from [place] with alcohol/uncontrolled diabetes induced cirrhosis and thrombocytopenia. Platelet count today is 89, Hgb is 10.2 with WBC count 4.2.  He has a sore on his right lower lip for the last 3 weeks that has bled and clotted. He has a referral to ENT  for further work-up. We will see what they decide to do. If he needs to have biopsy or surgery we will look into having him start one of the oral platelet boosting agents.  We will plan to see him back in another 6 weeks for follow-up.   All questions were answered and he is in agreement with the plan. He will contact our office with any questions or concerns. We can certainly see him much sooner if needed.  He was discussed with and also seen by Dr. Marin Olp and he is in agreement with the aforementioned.   Laverna Peace     Addendum: I saw and examined the patient with Judson Roch.  I agree with her above assessment.  I looked at his blood smear under the microscope.  I did not see anything that looked suspicious.  He had good maturity of his white blood cells.  His platelets appear a little bit large but well granulated.  He has no rouleaux formation.  There is no inclusion bodies with his red blood cells.  He had no schistocytes.  I am sure that his thrombocytopenia is probably from his cirrhosis.  He does have bad  diabetes.  I think that if is blood sugars got under better control, then his platelet count might get a little bit better.  I believe that his prognosis is clearly dependent upon his liver function and how slowly or quickly his liver function declines.  We spent about 40 minutes with he and his daughter.  Over 50% of the time was spent face-to-face with them.  We reviewed his lab work.  We answered his questions.  Lattie Haw, MD

## 2017-05-13 ENCOUNTER — Telehealth: Payer: Self-pay | Admitting: Medical

## 2017-05-13 NOTE — Telephone Encounter (Signed)
I placed the referral and appointment is pending. I have asked Gwenn who coordinates the referral to check on status of the referral. This area on his lip is challenge in terms of treatment. He first needs a definitive diagnsosis. Biopsy probable.  They can schedule with me if appointment with me further delayed passed this week. Or by Friday if area has changed. I have not seen him in over a month.

## 2017-05-13 NOTE — Telephone Encounter (Signed)
Copied from [place]. Topic: Quick Communication - See Telephone Encounter >> May 13, 2017  9:02 AM Ether Griffins B wrote: CRM for notification. See Telephone encounter for: 05/13/17. Pt's wife calling in requesting a medication for pt's mouth bc the soars are worse and they haven't heard from ENT.

## 2017-05-13 NOTE — Telephone Encounter (Signed)
Would you look at patient ENT referral and check on status. We need appointment ASAP. Has that been scheduled. Believe referral is old.

## 2017-05-22 ENCOUNTER — Other Ambulatory Visit: Payer: Self-pay | Admitting: Family Medicine

## 2017-05-22 ENCOUNTER — Encounter: Payer: Self-pay | Admitting: Medical

## 2017-05-23 MED ORDER — AMLODIPINE BESYLATE 2.5 MG PO TABS: 2.5000 mg | ORAL_TABLET | Freq: Every day | ORAL | 3 refills | Status: DC

## 2017-06-19 ENCOUNTER — Inpatient Hospital Stay: Payer: BLUE CROSS/BLUE SHIELD

## 2017-06-19 ENCOUNTER — Inpatient Hospital Stay: Payer: BLUE CROSS/BLUE SHIELD | Admitting: Family

## 2017-06-19 ENCOUNTER — Telehealth: Payer: Self-pay | Admitting: Hematology & Oncology

## 2017-06-19 NOTE — Telephone Encounter (Signed)
Per voicemail cxled lab/ov appt. lmom for pt to call office to resch appts

## 2017-06-20 ENCOUNTER — Emergency Department (HOSPITAL_COMMUNITY): Payer: BLUE CROSS/BLUE SHIELD

## 2017-06-20 ENCOUNTER — Emergency Department (HOSPITAL_COMMUNITY)
Admission: EM | Admit: 2017-06-20 | Discharge: 2017-06-20 | Payer: BLUE CROSS/BLUE SHIELD | Attending: Emergency Medicine | Admitting: Emergency Medicine

## 2017-06-20 ENCOUNTER — Encounter (HOSPITAL_COMMUNITY): Payer: Self-pay | Admitting: Emergency Medicine

## 2017-06-20 ENCOUNTER — Other Ambulatory Visit: Payer: Self-pay

## 2017-06-20 DIAGNOSIS — E1110 Type 2 diabetes mellitus with ketoacidosis without coma: Secondary | ICD-10-CM | POA: Insufficient documentation

## 2017-06-20 DIAGNOSIS — N189 Chronic kidney disease, unspecified: Secondary | ICD-10-CM | POA: Insufficient documentation

## 2017-06-20 DIAGNOSIS — I129 Hypertensive chronic kidney disease with stage 1 through stage 4 chronic kidney disease, or unspecified chronic kidney disease: Secondary | ICD-10-CM | POA: Insufficient documentation

## 2017-06-20 DIAGNOSIS — R42 Dizziness and giddiness: Secondary | ICD-10-CM | POA: Insufficient documentation

## 2017-06-20 DIAGNOSIS — G0490 Encephalitis and encephalomyelitis, unspecified: Secondary | ICD-10-CM | POA: Insufficient documentation

## 2017-06-20 DIAGNOSIS — H53132 Sudden visual loss, left eye: Secondary | ICD-10-CM | POA: Insufficient documentation

## 2017-06-20 DIAGNOSIS — H5462 Unqualified visual loss, left eye, normal vision right eye: Secondary | ICD-10-CM

## 2017-06-20 DIAGNOSIS — E1165 Type 2 diabetes mellitus with hyperglycemia: Secondary | ICD-10-CM | POA: Diagnosis present

## 2017-06-20 DIAGNOSIS — Z79899 Other long term (current) drug therapy: Secondary | ICD-10-CM | POA: Insufficient documentation

## 2017-06-20 DIAGNOSIS — E1010 Type 1 diabetes mellitus with ketoacidosis without coma: Secondary | ICD-10-CM

## 2017-06-20 LAB — I-STAT VENOUS BLOOD GAS, ED
Acid-base deficit: 1 mmol/L (ref 0.0–2.0)
Bicarbonate: 23.1 mmol/L (ref 20.0–28.0)
O2 Saturation: 63 %
TCO2: 24 mmol/L (ref 22–32)
pCO2, Ven: 34 mmHg — ABNORMAL LOW (ref 44.0–60.0)
pH, Ven: 7.44 — ABNORMAL HIGH (ref 7.250–7.430)
pO2, Ven: 31 mmHg — CL (ref 32.0–45.0)

## 2017-06-20 LAB — COMPREHENSIVE METABOLIC PANEL
ALT: 52 U/L (ref 17–63)
AST: 50 U/L — ABNORMAL HIGH (ref 15–41)
Albumin: 2.5 g/dL — ABNORMAL LOW (ref 3.5–5.0)
Alkaline Phosphatase: 264 U/L — ABNORMAL HIGH (ref 38–126)
Anion gap: 13 (ref 5–15)
BUN: 28 mg/dL — ABNORMAL HIGH (ref 6–20)
CO2: 20 mmol/L — ABNORMAL LOW (ref 22–32)
Calcium: 8.5 mg/dL — ABNORMAL LOW (ref 8.9–10.3)
Chloride: 101 mmol/L (ref 101–111)
Creatinine, Ser: 1.31 mg/dL — ABNORMAL HIGH (ref 0.61–1.24)
GFR calc Af Amer: >60 mL/min (ref >60)
GFR calc non Af Amer: 59 mL/min — ABNORMAL LOW (ref >60)
Glucose, Bld: 459 mg/dL — ABNORMAL HIGH (ref 65–99)
Potassium: 3.7 mmol/L (ref 3.5–5.1)
Sodium: 134 mmol/L — ABNORMAL LOW (ref 135–145)
Total Bilirubin: 4.4 mg/dL — ABNORMAL HIGH (ref 0.3–1.2)
Total Protein: 6.8 g/dL (ref 6.5–8.1)

## 2017-06-20 LAB — CBC WITH DIFFERENTIAL/PLATELET
Basophils Absolute: 0 10*3/uL (ref 0.0–0.1)
Basophils Relative: 0 %
Eosinophils Absolute: 0.1 10*3/uL (ref 0.0–0.7)
Eosinophils Relative: 1 %
HCT: 30.1 % — ABNORMAL LOW (ref 39.0–52.0)
Hemoglobin: 9.8 g/dL — ABNORMAL LOW (ref 13.0–17.0)
Lymphocytes Relative: 8 %
Lymphs Abs: 1.1 10*3/uL (ref 0.7–4.0)
MCH: 30 pg (ref 26.0–34.0)
MCHC: 32.6 g/dL (ref 30.0–36.0)
MCV: 92 fL (ref 78.0–100.0)
Monocytes Absolute: 2 10*3/uL — ABNORMAL HIGH (ref 0.1–1.0)
Monocytes Relative: 15 %
Neutro Abs: 10 10*3/uL — ABNORMAL HIGH (ref 1.7–7.7)
Neutrophils Relative %: 76 %
Platelets: 62 10*3/uL — ABNORMAL LOW (ref 150–400)
RBC: 3.27 MIL/uL — ABNORMAL LOW (ref 4.22–5.81)
RDW: 18.8 % — ABNORMAL HIGH (ref 11.5–15.5)
WBC: 13.1 10*3/uL — ABNORMAL HIGH (ref 4.0–10.5)

## 2017-06-20 LAB — URINALYSIS, ROUTINE W REFLEX MICROSCOPIC
Bacteria, UA: NONE SEEN
Bilirubin Urine: NEGATIVE
Glucose, UA: >=500 mg/dL — AB
Hgb urine dipstick: NEGATIVE
Ketones, ur: 5 mg/dL — AB
Leukocytes, UA: NEGATIVE
Nitrite: NEGATIVE
Protein, ur: NEGATIVE mg/dL
Specific Gravity, Urine: 1.021 (ref 1.005–1.030)
pH: 6 (ref 5.0–8.0)

## 2017-06-20 LAB — CBG MONITORING, ED
GLUCOSE-CAPILLARY: 357 mg/dL — AB (ref 65–99)
Glucose-Capillary: 463 mg/dL — ABNORMAL HIGH (ref 65–99)

## 2017-06-20 MED ORDER — DIPHENHYDRAMINE HCL 50 MG/ML IJ SOLN: 25.0000 mg | Freq: Every day | INTRAMUSCULAR | Status: DC | PRN

## 2017-06-20 MED ORDER — SODIUM CHLORIDE 0.9 % IV SOLN
1.0000 g | Freq: Once | INTRAVENOUS | Status: AC
  Administered 2017-06-20: 1 g via INTRAVENOUS
  Filled 2017-06-20: qty 10

## 2017-06-20 MED ORDER — SODIUM CHLORIDE 0.9 % IV SOLN
INTRAVENOUS | Status: DC
  Administered 2017-06-20: 3 [IU]/h via INTRAVENOUS
  Filled 2017-06-20: qty 1

## 2017-06-20 MED ORDER — DIPHENHYDRAMINE HCL 25 MG PO CAPS: 25.0000 mg | ORAL_CAPSULE | Freq: Every day | ORAL | Status: DC | PRN

## 2017-06-20 MED ORDER — HEPARIN (PORCINE) IN NACL 100-0.45 UNIT/ML-% IJ SOLN
1000.0000 [IU]/h | INTRAMUSCULAR | Status: DC
  Administered 2017-06-20: 1000 [IU]/h via INTRAVENOUS
  Filled 2017-06-20: qty 250

## 2017-06-20 MED ORDER — DEXTROSE 5 % IV SOLN
5.0000 mg/kg | INTRAVENOUS | Status: DC
  Filled 2017-06-20: qty 320

## 2017-06-20 MED ORDER — IOHEXOL 300 MG/ML  SOLN
75.0000 mL | Freq: Once | INTRAMUSCULAR | Status: AC | PRN
  Administered 2017-06-20: 75 mL via INTRAVENOUS

## 2017-06-20 MED ORDER — GADOBENATE DIMEGLUMINE 529 MG/ML IV SOLN
20.0000 mL | Freq: Once | INTRAVENOUS | Status: AC
  Administered 2017-06-20: 20 mL via INTRAVENOUS

## 2017-06-20 MED ORDER — MEPERIDINE HCL 25 MG/ML IJ SOLN: 25.0000 mg | INTRAMUSCULAR | Status: DC | PRN

## 2017-06-20 MED ORDER — ACETAMINOPHEN 325 MG PO TABS: 650.0000 mg | ORAL_TABLET | Freq: Every day | ORAL | Status: DC | PRN

## 2017-06-20 MED ORDER — SODIUM CHLORIDE 0.9 % IV BOLUS FOR AMBISOME
500.0000 mL | INTRAVENOUS | Status: DC
  Filled 2017-06-20: qty 500

## 2017-06-20 MED ORDER — SODIUM CHLORIDE 0.9 % IV BOLUS FOR AMBISOME
500.0000 mL | INTRAVENOUS | Status: DC
  Administered 2017-06-20: 500 mL via INTRAVENOUS
  Filled 2017-06-20: qty 500

## 2017-06-20 MED ORDER — DEXTROSE 5% FOR FLUSHING BEFORE AND AFTER AMBISOME
10.0000 mL | INTRAVENOUS | Status: DC
  Filled 2017-06-20: qty 50

## 2017-06-20 MED ORDER — VANCOMYCIN HCL IN DEXTROSE 1-5 GM/200ML-% IV SOLN
1000.0000 mg | Freq: Once | INTRAVENOUS | Status: DC
  Filled 2017-06-20: qty 200

## 2017-06-20 MED ORDER — SODIUM CHLORIDE 0.9 % IV BOLUS
1000.0000 mL | Freq: Once | INTRAVENOUS | Status: AC
  Administered 2017-06-20: 1000 mL via INTRAVENOUS

## 2017-06-20 MED ORDER — HEPARIN BOLUS VIA INFUSION
4000.0000 [IU] | Freq: Once | INTRAVENOUS | Status: AC
  Administered 2017-06-20: 4000 [IU] via INTRAVENOUS
  Filled 2017-06-20: qty 4000

## 2017-06-20 MED ORDER — DEXTROSE 5 % IV SOLN
3.0000 mg/kg | INTRAVENOUS | Status: DC
  Filled 2017-06-20: qty 190

## 2017-06-20 NOTE — ED Notes (Signed)
RN to MRI with pt to assist with transferring heparin pump

## 2017-06-20 NOTE — ED Notes (Signed)
Urine culture sent to lab with UA. 

## 2017-06-20 NOTE — ED Provider Notes (Signed)
[facility] EMERGENCY DEPARTMENT Provider Note   CSN: [id number] Arrival date & time: 06/20/17  1023     History   Chief Complaint Chief Complaint  Patient presents with  . Hyperglycemia  . Dizziness  . Eye Problem    HPI Arthur Woodard is a 57 y.o. male.  HPI Patient presents to the emergency department with weakness dizziness and loss of vision in the left eye.  The patient states that he has been having some weakness and dizziness over the last couple of weeks it is gotten worse in the last few days.  He states that last night he noticed that his eye felt heavy.  And then woke up this morning with no vision in the left eye.  He states that and see some light and dark but also feels like his lid and eye are swollen.  Patient states that he did not take any medications prior to arrival for symptoms.  The patient states is not been on any diabetes medications for several years because he was told that his diabetes was resolved.  The patient denies chest pain, shortness of breath, headache,blurred vision, neck pain, fever, cough, weakness, numbness, dizziness, anorexia, edema, abdominal pain, nausea, vomiting, diarrhea, rash, back pain, dysuria, hematemesis, bloody stool, near syncope, or syncope. Past Medical History:  Diagnosis Date  . Allergy   . Anemia   . Chronic kidney disease   . Cirrhosis ([place])   . Diabetes mellitus   . Helicobacter pylori gastritis    severe  . Hypertension   . Liver lesion   . Liver nodule 05/15/2011  . Sleep apnea   . Splenomegaly, congestive, chronic   . Thrombocytopenia ([place])   . Tubular adenoma of colon     Patient Active Problem List   Diagnosis Date Noted  . Esophageal varices determined by endoscopy ([place]) 03/15/2015  . Lactic acidosis 08/06/2014  . Hypophosphatemia 08/06/2014  . Hypokalemia 08/06/2014  . Hypomagnesemia 08/06/2014  . Metformin adverse reaction 08/06/2014  . Metabolic acidosis 08/29/1973  . Gout  09/18/2012  . Hyperkalemia 06/30/2012  . Anemia 06/30/2012  . Diabetes mellitus ([place]) 06/30/2012  . Adenomatous colon polyp 09/11/2011  . Liver nodule 05/15/2011  . Alcoholic cirrhosis ([place])   . Splenomegaly, congestive, chronic   . Alcohol abuse 02/17/2008  . HYPERLIPIDEMIA 12/08/2006  . Essential hypertension 12/08/2006  . ALLERGIC RHINITIS 12/08/2006    Past Surgical History:  Procedure Laterality Date  . BONE MARROW BIOPSY    . TOOTH EXTRACTION          Home Medications    Prior to Admission medications   Medication Sig Start Date End Date Taking? Authorizing Provider  amLODipine (NORVASC) 5 MG tablet Take 5 mg by mouth daily.   Yes [provider]  cetirizine (ZYRTEC) 10 MG tablet Take 10 mg by mouth daily as needed for allergies.    Yes [provider]  FOLIC ACID PO Take 1 tablet by mouth daily. Pt takes 400 mcg daily   Yes [provider]  predniSONE (DELTASONE) 10 MG tablet Take 10 mg by mouth as directed. Take 2 tabs 3 times a day for 5 days, Take 2 tabs twice a day for 7 days. Take 2 tabs daily for 2 weeks. 06/04/17  Yes [provider]  Thiamine HCl (VITAMIN B-1) 250 MG tablet Take 250 mg by mouth daily.   Yes [provider]  carvedilol (COREG) 6.25 MG tablet Take 1 tablet (6.25 mg total) by  mouth 2 (two) times daily with a meal. Patient not taking: Reported on 06/20/2017 03/30/15   Saguier, [name], PA-C  glucose blood test strip Use as instructed 10/16/15   Saguier, [name], PA-C    Family History Family History  Problem Relation Age of Onset  . Colon cancer Neg Hx   . Esophageal cancer Neg Hx   . Rectal cancer Neg Hx   . Stomach cancer Neg Hx     Social History Social History   Tobacco Use  . Smoking status: Never Smoker  . Smokeless tobacco: Never Used  Substance Use Topics  . Alcohol use: No    Alcohol/week: 0.0 oz  . Drug use: No     Allergies   Patient has no known allergies.   Review of  Systems Review of Systems All other systems negative except as documented in the HPI. All pertinent positives and negatives as reviewed in the HPI.  Physical Exam Updated Vital Signs BP (!) 143/76   Pulse 96   Temp 98 F (36.7 C) (Oral)   Resp (!) 23   Wt 63 kg (139 lb)   SpO2 100%   BMI 24.62 kg/m   Physical Exam  Constitutional: He is oriented to person, place, and time. He appears well-developed and well-nourished. No distress.  HENT:  Head: Normocephalic and atraumatic.  Mouth/Throat: Oropharynx is clear and moist.  Eyes: Scleral icterus is present. Left eye exhibits abnormal extraocular motion. Left pupil is not reactive.    Neck: Normal range of motion. Neck supple.  Cardiovascular: Normal rate, regular rhythm and normal heart sounds. Exam reveals no gallop and no friction rub.  No murmur heard. Pulmonary/Chest: Effort normal and breath sounds normal. No respiratory distress. He has no wheezes.  Abdominal: Soft. Bowel sounds are normal. He exhibits no distension. There is no tenderness.  Neurological: He is alert and oriented to person, place, and time. He exhibits normal muscle tone. Coordination normal.  Skin: Skin is warm and dry. Capillary refill takes less than 2 seconds. No rash noted. No erythema.  Psychiatric: He has a normal mood and affect. His behavior is normal.  Nursing note and vitals reviewed.    ED Treatments / Results  Labs (all labs ordered are listed, but only abnormal results are displayed) Labs Reviewed  COMPREHENSIVE METABOLIC PANEL - Abnormal; Notable for the following components:      Result Value   Sodium 134 (*)    CO2 20 (*)    Glucose, Bld 459 (*)    BUN 28 (*)    Creatinine, Ser 1.31 (*)    Calcium 8.5 (*)    Albumin 2.5 (*)    AST 50 (*)    Alkaline Phosphatase 264 (*)    Total Bilirubin 4.4 (*)    GFR calc non Af Amer 59 (*)    All other components within normal limits  CBC WITH DIFFERENTIAL/PLATELET - Abnormal; Notable for  the following components:   WBC 13.1 (*)    RBC 3.27 (*)    Hemoglobin 9.8 (*)    HCT 30.1 (*)    RDW 18.8 (*)    Platelets 62 (*)    Neutro Abs 10.0 (*)    Monocytes Absolute 2.0 (*)    All other components within normal limits  URINALYSIS, ROUTINE W REFLEX MICROSCOPIC - Abnormal; Notable for the following components:   Glucose, UA >=500 (*)    Ketones, ur 5 (*)    All other components within normal limits  CBG  MONITORING, ED - Abnormal; Notable for the following components:   Glucose-Capillary 463 (*)    All other components within normal limits  I-STAT VENOUS BLOOD GAS, ED - Abnormal; Notable for the following components:   pH, Ven 7.440 (*)    pCO2, Ven 34.0 (*)    pO2, Ven 31.0 (*)    All other components within normal limits    EKG EKG Interpretation  Date/Time:  Friday Jun 20 2017 11:27:21 EDT Ventricular Rate:  95 PR Interval:    QRS Duration: 88 QT Interval:  383 QTC Calculation: 482 R Axis:   27 Text Interpretation:  Sinus rhythm Borderline prolonged QT interval Confirmed by Blanchie Dessert [phone]) on 06/20/2017 2:49:30 PM   Radiology Ct Orbits W Contrast  Result Date: 06/20/2017 CLINICAL DATA:  LEFT eye swelling, visual loss. EXAM: CT ORBITS WITH CONTRAST TECHNIQUE: Multidetector CT images was performed according to the standard protocol following intravenous contrast administration. CONTRAST:  22m OMNIPAQUE IOHEXOL 300 MG/ML  SOLN COMPARISON:  None. FINDINGS: Orbits: LEFT eye is proptotic, with extraocular muscles are mildly enlarged compared to the RIGHT. Both globes are symmetric. The LEFT superior ophthalmic vein is dilated and hyperdense. No enlarged arterial structures, or venous varix. 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 Visualized sinuses: Chronic maxillary sinus disease. Asymmetric LEFT greater than RIGHT ethmoid sinus fluid, slight layering in a Haller cell, could suggest mild acuity. No opacity adjacent to the LEFT  lamina papyracea. Sphenoid sinus essentially clear. Frontal sinuses hypoplastic. Soft tissues: There may be mild LEFT facial soft tissue swelling. Limited intracranial: Negative acute. Atrophy. Calcific atherosclerosis. No cavernous sinus mass. IMPRESSION: Proptotic LEFT eye, with mild retrobulbar edema. 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 Ophthalmologic consultation may be warranted. No evidence for associated paranasal sinus focus of infection or cavernous sinus mass. Electronically Signed   By: JStaci RighterM.D.   On: 06/20/2017 15:58    Procedures Procedures (including critical care time)  Medications Ordered in ED Medications  sodium chloride 0.9 % bolus 1,000 mL (0 mLs Intravenous Stopped 06/20/17 1446)  iohexol (OMNIPAQUE) 300 MG/ML solution 75 mL (75 mLs Intravenous Contrast Given 06/20/17 1526)     Initial Impression / Assessment and Plan / ED Course  I have reviewed the triage vital signs and the nursing notes.  Pertinent labs & imaging results that were available during my care of the patient were reviewed by me and considered in my medical decision making (see chart for details).     Spoke with ophthalmology who will evaluate the patient.  Also spoke with neurology as requested by the ophthalmologist for this thrombus.  The ophthalmologist was concerned that he may have a dural venous thrombosis or cavernous sinus thrombosis.  The ophthalmologist states that he does not treat the thrombus but needs anticoagulation.  I spoke with neurology who states that the patient will most likely need admission but they have no further recommendations because of the fact that it does not involve any neurological structure.  They did speak with the neuroradiologist who did advise an MRI would be advisable to further evaluate for any other issues involving that region.  Stated that an MRV would not give as much information as an MRI of the orbits with and without  contrast.  I did order heparin to be initiated on the patient  Final Clinical Impressions(s) / ED Diagnoses   Final diagnoses:  None    ED Discharge Orders    None  Dalia Heading, PA-C 06/20/17 1656    Blanchie Dessert, MD 06/20/17 2117

## 2017-06-20 NOTE — ED Notes (Signed)
Patient transported to MRI 

## 2017-06-20 NOTE — Consult Note (Signed)
CC:  Chief Complaint  Patient presents with  . Hyperglycemia  . Dizziness  . Eye Problem    HPI: Arthur Woodard is a 57 y.o. male w/ POH of RE and PMH below who presents for evaluation of dizziness, eye problem and elevated sugars. Reports loss of vision in the left eye. Last nigh eye felt heavy. Patient has not been taking any medications for his sugars.  ROS: See above  PMH: Past Medical History:  Diagnosis Date  . Allergy   . Anemia   . Chronic kidney disease   . Cirrhosis ([place])   . Diabetes mellitus   . Helicobacter pylori gastritis    severe  . Hypertension   . Liver lesion   . Liver nodule 05/15/2011  . Sleep apnea   . Splenomegaly, congestive, chronic   . Thrombocytopenia ([place])   . Tubular adenoma of colon     PSH: Past Surgical History:  Procedure Laterality Date  . BONE MARROW BIOPSY    . TOOTH EXTRACTION      Meds: No current facility-administered medications on file prior to encounter.    Current Outpatient Medications on File Prior to Encounter  Medication Sig Dispense Refill  . amLODipine (NORVASC) 5 MG tablet Take 5 mg by mouth daily.    . cetirizine (ZYRTEC) 10 MG tablet Take 10 mg by mouth daily as needed for allergies.     Marland Kitchen FOLIC ACID PO Take 1 tablet by mouth daily. Pt takes 400 mcg daily    . predniSONE (DELTASONE) 10 MG tablet Take 10 mg by mouth as directed. Take 2 tabs 3 times a day for 5 days, Take 2 tabs twice a day for 7 days. Take 2 tabs daily for 2 weeks.  0  . Thiamine HCl (VITAMIN B-1) 250 MG tablet Take 250 mg by mouth daily.    . carvedilol (COREG) 6.25 MG tablet Take 1 tablet (6.25 mg total) by mouth 2 (two) times daily with a meal. (Patient not taking: Reported on 06/20/2017) 60 tablet 3  . glucose blood test strip Use as instructed 100 each 12    SH: Social History   Socioeconomic History  . Marital status: Married    Spouse name: Not on file  . Number of children: 2  . Years of education: Not on file  . Highest education  level: Not on file  Occupational History  . Occupation: unemployed    Employer: [employer]  . Financial resource strain: Not on file  . Food insecurity:    Worry: Not on file    Inability: Not on file  . Transportation needs:    Medical: Not on file    Non-medical: Not on file  Tobacco Use  . Smoking status: Never Smoker  . Smokeless tobacco: Never Used  Substance and Sexual Activity  . Alcohol use: No    Alcohol/week: 0.0 oz  . Drug use: No  . Sexual activity: Never  Lifestyle  . Physical activity:    Days per week: Not on file    Minutes per session: Not on file  . Stress: Not on file  Relationships  . Social connections:    Talks on phone: Not on file    Gets together: Not on file    Attends religious service: Not on file    Active member of club or organization: Not on file    Attends meetings of clubs or organizations: Not on file    Relationship status: Not  on file  Other Topics Concern  . Not on file  Social History Narrative  . Not on file    FH: Family History  Problem Relation Age of Onset  . Colon cancer Neg Hx   . Esophageal cancer Neg Hx   . Rectal cancer Neg Hx   . Stomach cancer Neg Hx     Exam:  Lucianne Lei: OD: 4 pt cc OS: NLP  CVF: OD: full OS: NLP  EOM: OD: full d/v OS: complete ophthalmoplegia  Pupils: OD: 3->2 mm, no APD OS: 5 mm - fixed, + APD  IOP: by [place] OD:18 OS: 16  External: OD: no periorbital edema, no proptosis, V1 intact and V2-V3 intact and symmetric, good orbicularis strength OS:  Mild lid edema, near complete ptosis, proptosis, V1 numbness, V2-V3 intact and symmetric, poor orbicularis   Pen Light Exam: L/L: OD: WNL OS: ptosis  C/S: OD: white and quiet OS: 2+ inf chemosis  K: OD: clear, no abnormal staining OS: clear, no abnormal staining  A/C: OD: grossly deep and quiet appearing by pen light OS: grossly deep and quiet appearing by pen light  I: OD: round and regular OS:  round and regular  L: OD: NSC OS: NSC  DFE: NO DILATION DROPS PLACED, OS blown pupil  V: OD: clear OS: clear  N: OD: unable OS: C/D 0.3, no disc edema  M: OD: unable OS: flat, no obvious macular pathology  V: OD: unable OS: normal appearing vessels  P: OD: retina flat 360, no obvious mass/RT/RD OS: retina flat 360, no obvious mass/RT/RD  A/P:  1. 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 DM c/b DKA: - Recommend admission for management of DKA   3. Leukocystosis: - Recommend admission and evaluation for possible infection - Recommend broad spectrum antibiotics per primary team as infectioun is a cause of superior ophthalmic vein thrombosis   Christopher T. Manuella Ghazi, [place]

## 2017-06-20 NOTE — ED Triage Notes (Addendum)
Pt in from home via [facility] with multiple complaints - states he has dealt with weakness/dizziness and lost 20lbs x 1 mo, CBG is 599. Also c/o L eye heaviness/swelling since yesterday. Today, pt woke up and couldn't see out of same eye. L pupil dilated and NR, denies any head trauma or HA. Recent start on Prednisone for lip swelling and bleeding. A&ox4, MAE's equally. Orthostatic - dropped 40pts sitting to standing per EMS

## 2017-06-20 NOTE — Progress Notes (Signed)
ANTICOAGULATION CONSULT NOTE - Initial Consult  Pharmacy Consult for Heparin Indication: SOV thrombosis  No Known Allergies  Patient Measurements: Weight: 139 lb (63 kg) Heparin Dosing Weight: 63 kg  Vital Signs: Temp: 98 F (36.7 C) (05/03 1109) Temp Source: Oral (05/03 1109) BP: 143/76 (05/03 1545) Pulse Rate: 96 (05/03 1545)  Labs: Recent Labs    06/20/17 1122  HGB 9.8*  HCT 30.1*  PLT 62*  CREATININE 1.31*   Estimated Creatinine Clearance: 50.7 mL/min (A) (by C-G formula based on SCr of 1.31 mg/dL (H)).  Assessment: 33 yoM presenting with L eye heaviness/swelling, unable to see out of L eye this AM. CT orbits revealed dense L superior ophthalmic vein consistent with SOV thrombosis. Pharmacy consulted to dose IV heparin. No AC PTA. Hgb 9.8, pltc 62. Appears to have chronic anemia and thrombocytopenia. No bleeding noted.  Goal of Therapy:  Heparin level 0.3-0.7 units/ml Monitor platelets by anticoagulation protocol: Yes   Plan:  Give 4000 units bolus x 1 Start heparin infusion at 1000 units/hr Check heparin level in 6 hours Daily heparin level and CBC Monitor for s/sx of bleeding  Chin Wachter N. Gerarda Fraction, PharmD PGY1 Pharmacy Resident Pager: [phone] 06/20/2017,4:47 PM

## 2017-06-20 NOTE — ED Provider Notes (Signed)
1700: I received signout on the patient, pending MRI results, ophthalmology consult, neurology consult On my initial exam the patient was awake alert, though he had notably abnormal left eye, with proptosis, and chemosis.   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  Patient aware of these initial findings, and with the after mentioned concern of infection, as well as DKA, I also started the patient on an insulin drip.  Update:, Patient aware of transfer to [facility] for further evaluation and management. In the interim I also discussed patient's case with our infectious disease team, and ENT specialist, who concurs that the patient will benefit from transfer to tertiary academic care center. On time of transfer the patient is receiving antifungal, antibiotic, insulin drip.  9:17 PM  On evaluation of the nasal septum, no obvious lesion, though the eye remains prototic with chemosis No initial adverse reaction to the multiple antifungal antibiotic and insulin medication.    CRITICAL CARE Performed by: Carmin Muskrat Total critical care time: 40 minutes Critical care time was exclusive of separately billable procedures and treating other patients. Critical care was necessary to treat or prevent imminent or life-threatening deterioration. Critical care was time spent personally by me on the following activities: development of treatment plan with patient and/or surrogate as well as nursing, discussions with consultants, evaluation of patient's response to treatment, examination of patient, obtaining history from patient or surrogate, ordering  and performing treatments and interventions, ordering and review of laboratory studies, ordering and review of radiographic studies, pulse oximetry and re-evaluation of patient's condition.    Carmin Muskrat, MD 06/20/17 2118

## 2017-06-20 NOTE — ED Notes (Signed)
Opthalmology MD at bedside.

## 2017-06-21 MED ORDER — HEPARIN SOD (PORCINE) IN D5W 100 UNIT/ML IV SOLN
5.00 | INTRAVENOUS | Status: DC
Start: ? — End: 2017-06-21

## 2017-06-21 MED ORDER — MUPIROCIN 2 % EX OINT
TOPICAL_OINTMENT | CUTANEOUS | Status: DC
Start: 2017-06-25 — End: 2017-06-21

## 2017-06-21 MED ORDER — DEXTROSE 5 % IV SOLN
10.00 | INTRAVENOUS | Status: DC
Start: 2017-07-01 — End: 2017-06-21

## 2017-06-21 MED ORDER — GENERIC EXTERNAL MEDICATION
2.00 g | Status: DC
Start: 2017-06-23 — End: 2017-06-21

## 2017-06-21 MED ORDER — SODIUM CHLORIDE 0.9 % IV SOLN
250.00 | INTRAVENOUS | Status: DC
Start: 2017-07-01 — End: 2017-06-21

## 2017-06-21 MED ORDER — SODIUM CHLORIDE 0.9 % IV SOLN
250.00 | INTRAVENOUS | Status: DC
Start: 2017-06-26 — End: 2017-06-21

## 2017-06-21 MED ORDER — DEXTROSE 5 % IV SOLN
10.00 | INTRAVENOUS | Status: DC
Start: 2017-06-24 — End: 2017-06-21

## 2017-06-21 MED ORDER — VANCOMYCIN HCL IN DEXTROSE 1-5 GM/200ML-% IV SOLN
1.00 g | INTRAVENOUS | Status: DC
Start: 2017-06-23 — End: 2017-06-21

## 2017-06-21 MED ORDER — GENERIC EXTERNAL MEDICATION
10.00 | Status: DC
Start: 2017-06-21 — End: 2017-06-21

## 2017-06-21 MED ORDER — GENERIC EXTERNAL MEDICATION
5.00 | Status: DC
Start: 2017-06-24 — End: 2017-06-21

## 2017-06-21 MED ORDER — INSULIN LISPRO 100 UNIT/ML ~~LOC~~ SOLN
1.00 | SUBCUTANEOUS | Status: DC
Start: 2017-06-24 — End: 2017-06-21

## 2017-06-21 MED ORDER — GENERIC EXTERNAL MEDICATION
Status: DC
Start: 2017-06-30 — End: 2017-06-21

## 2017-06-21 MED ORDER — SODIUM CHLORIDE 0.9 % IV SOLN
INTRAVENOUS | Status: DC
Start: ? — End: 2017-06-21

## 2017-06-21 MED ORDER — LACTATED RINGERS IV SOLN
INTRAVENOUS | Status: DC
Start: ? — End: 2017-06-21

## 2017-06-21 MED ORDER — GENERIC EXTERNAL MEDICATION
5.00 | Status: DC
Start: 2017-06-22 — End: 2017-06-21

## 2017-06-21 MED ORDER — DEXTROSE 10 % IV SOLN
125.00 | INTRAVENOUS | Status: DC
Start: ? — End: 2017-06-21

## 2017-06-23 MED ORDER — OXYMETAZOLINE HCL 0.05 % NA SOLN
2.00 | NASAL | Status: DC
Start: ? — End: 2017-06-23

## 2017-06-23 MED ORDER — LACTATED RINGERS IV SOLN
INTRAVENOUS | Status: DC
Start: ? — End: 2017-06-23

## 2017-06-23 MED ORDER — GENERIC EXTERNAL MEDICATION
2.00 | Status: DC
Start: 2017-06-30 — End: 2017-06-23

## 2017-06-24 MED ORDER — DEXTROSE 5 % IV SOLN
10.00 | INTRAVENOUS | Status: DC
Start: 2017-06-26 — End: 2017-06-24

## 2017-06-24 MED ORDER — GENERIC EXTERNAL MEDICATION
12.00 g | Status: DC
Start: 2017-06-30 — End: 2017-06-24

## 2017-06-24 MED ORDER — GENERIC EXTERNAL MEDICATION
10.00 | Status: DC
Start: 2017-06-25 — End: 2017-06-24

## 2017-06-24 MED ORDER — LACTATED RINGERS IV SOLN
INTRAVENOUS | Status: DC
Start: ? — End: 2017-06-24

## 2017-06-24 MED ORDER — GENERIC EXTERNAL MEDICATION
7.50 | Status: DC
Start: 2017-06-26 — End: 2017-06-24

## 2017-06-25 MED ORDER — INSULIN LISPRO 100 UNIT/ML ~~LOC~~ SOLN
1.00 | SUBCUTANEOUS | Status: DC
Start: 2017-06-30 — End: 2017-06-25

## 2017-06-25 MED ORDER — INSULIN LISPRO 100 UNIT/ML ~~LOC~~ SOLN
5.00 | SUBCUTANEOUS | Status: DC
Start: 2017-06-25 — End: 2017-06-25

## 2017-06-25 MED ORDER — POTASSIUM CHLORIDE ER 10 MEQ PO CPCR
ORAL_CAPSULE | ORAL | Status: DC
Start: 2017-06-25 — End: 2017-06-25

## 2017-06-30 MED ORDER — GENERIC EXTERNAL MEDICATION
5.00 | Status: DC
Start: 2017-07-01 — End: 2017-06-30

## 2017-06-30 MED ORDER — SODIUM CHLORIDE 0.9 % IV SOLN
250.00 | INTRAVENOUS | Status: DC
Start: 2017-06-30 — End: 2017-06-30

## 2017-06-30 MED ORDER — DEXTROSE 5 % IV SOLN
10.00 | INTRAVENOUS | Status: DC
Start: 2017-06-30 — End: 2017-06-30

## 2017-07-03 ENCOUNTER — Telehealth: Payer: Self-pay

## 2017-07-03 NOTE — Telephone Encounter (Signed)
Per Mackie Pai, called patient to have him come in for a hospital follow up on Monday 07/07/17. Patient did not answer, tried mobile and home phone. Left message on mobile to return call. Please schedule patient 45 minute office visit per pcp on Monday.

## 2017-07-04 ENCOUNTER — Telehealth: Payer: Self-pay | Admitting: Medical

## 2017-07-04 NOTE — Telephone Encounter (Signed)
I saw the note summary but did not see anything for me to sign. If they have any paperwork for me to sign I will. Will you let them know on monday

## 2017-07-04 NOTE — Telephone Encounter (Signed)
Copied from [place] [phone]. Topic: Quick Communication - See Telephone Encounter >> Jul 04, 2017  9:38 AM Bea Graff, NT wrote: CRM for notification. See Telephone encounter for: 07/04/17. Amber with [facility] wanting to verify the orders will be signed for this pt to have hospice care. 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 There is a lot of fluid in his abdomen as well. CB#: [phone]

## 2017-07-04 NOTE — Telephone Encounter (Signed)
verbal orders giving for hospice care.

## 2017-07-07 NOTE — Telephone Encounter (Signed)
Received letter for Admission to [facility]; forwarded to provider/SLS 05/20  Obtained signatures from PCP and Attending Physician; faxed with confirmation to Hospice/SLS 05/20

## 2017-07-16 ENCOUNTER — Telehealth: Payer: Self-pay | Admitting: *Deleted

## 2017-07-16 NOTE — Telephone Encounter (Signed)
Received Patient's Plan of Care Updates for review from [facility] [place]; forwarded to provider/SLS 05/29

## 2017-07-21 ENCOUNTER — Telehealth: Payer: Self-pay | Admitting: *Deleted

## 2017-07-21 NOTE — Telephone Encounter (Signed)
Received Plan of Care Updates for Review  from [facility] [place]; forwarded to provider/SLS 06/03

## 2017-08-05 ENCOUNTER — Telehealth: Payer: Self-pay | Admitting: *Deleted

## 2017-08-05 NOTE — Telephone Encounter (Signed)
Received Medical Chart/Clinical Notes from [facility]; forwarded to provider for review/SLS 06/18

## 2017-08-06 ENCOUNTER — Encounter: Payer: Self-pay | Admitting: *Deleted

## 2017-08-06 NOTE — Congregational Nurse Program (Signed)
[id number]/HCSPZZ Davis,BSN,RN3,CCM,[phone]/Spoke witrh the wife Arthur Woodard.  She states that Arthur Woodard is being kept comfortable and that the children are home from college and school and helping her out.  Encouraged to call the church at anytime or myself if she needs anything such as gas cards, food or transportation.  States that right now everyone is coping and are aware that his end of life may be soon.   Arthur Woodard is under hospice care at this time and the family is aware of the grieving services available to them.

## 2017-08-07 ENCOUNTER — Telehealth: Payer: Self-pay | Admitting: *Deleted

## 2017-08-07 NOTE — Telephone Encounter (Signed)
Received Physician Orders[multiple] from [facility]; forwarded to provider/SLS 06/20

## 2017-08-08 NOTE — Telephone Encounter (Addendum)
If you look at the telephone notes in the patient's chart since he was admitted to [facility]osp[facility] since 05/17, you could have relayed this information to the caller that orders had been forwarded to provider[s] on 07/04/17, 07/16/17, 07/24/17, 08/05/17, and 08/07/17. Multiple Orders faxed with confirmation again to Hospice. We need to be more proactive/SLS 06/21

## 2017-08-08 NOTE — Telephone Encounter (Signed)
Hospice is calling about the Pallitive care forms faxed to office several times and not received back.  Portal set up for dr. To sign on line.  They are needing this done today

## 2017-08-27 ENCOUNTER — Telehealth: Payer: Self-pay | Admitting: Medical

## 2017-08-27 NOTE — Telephone Encounter (Signed)
Copied from [place] [id number]. Topic: Quick Communication - See Telephone Encounter >> Aug 27, 2017  1:56 PM Rutherford Nail, NT wrote: CRM for notification. See Telephone encounter for: 08/27/17. Shivan with [facility] calling and states that Dr Etter Sjogren has some orders in the portal that need to be signed off on. Please advise. CB#: [phone]

## 2017-08-28 NOTE — Telephone Encounter (Signed)
I am having trouble getting into portal

## 2017-08-29 ENCOUNTER — Telehealth: Payer: Self-pay

## 2017-08-29 NOTE — Telephone Encounter (Signed)
Dr. Etter Sjogren signed and gave to [name].

## 2017-08-29 NOTE — Telephone Encounter (Signed)
Fax for wound care orders to [facility] that was sent earlier this AM by [name], LPN failed, so author retrieved different fax number, but was unable to find original document, so transmission log with orders faxed to [phone]. Author then phoned [facility] again to follow up and provide verbal orders per Dr. Etter Sjogren. Orders given, but nurse unsure of what kind of wound cleansing solution the order was referring to. Nurse will call back she said if she has questions. Routed to PCP, [name], LPN, and [place], [place].

## 2017-08-29 NOTE — Telephone Encounter (Signed)
Faxed orders back to [facility].

## 2017-08-29 NOTE — Telephone Encounter (Signed)
STates they faxed orders over and will fax again just incase we did not get them.

## 2017-08-30 NOTE — Telephone Encounter (Signed)
Gave to [name] to fax over after Dr. Etter Sjogren signed.

## 2017-09-01 NOTE — Telephone Encounter (Signed)
Faxed wound care orderes to [facility].

## 2017-09-04 ENCOUNTER — Telehealth: Payer: Self-pay | Admitting: Medical

## 2017-09-04 NOTE — Telephone Encounter (Signed)
Copied from [place] [phone]. Topic: General - Other >> Sep 04, 2017 10:04 AM Valla Leaver wrote: Reason for CRM: Kenney Houseman, RN with [facility] needs orders for recertification for hospice care.

## 2017-09-04 NOTE — Telephone Encounter (Signed)
Seems like Dr. Etter Sjogren and I just signed some paperwork about 10 days ago. Can they refax paperwork and then we can sign off?

## 2017-09-08 NOTE — Telephone Encounter (Signed)
Author phoned Arthur Woodard from [facility] to ask if form can be re-faxed per [name], [place], and author left detailed VM with fax number [phone]. Awaiting response.

## 2017-09-10 ENCOUNTER — Telehealth: Payer: Self-pay | Admitting: *Deleted

## 2017-09-10 NOTE — Telephone Encounter (Signed)
Received Physician Orders from [facility]; forwarded to provider/SLS 07/24

## 2017-09-15 ENCOUNTER — Telehealth: Payer: Self-pay

## 2017-09-15 NOTE — Telephone Encounter (Signed)
Copied from [place] [phone]. Topic: Inquiry >> Sep 15, 2017  3:07 PM Oliver Pila B wrote: Reason for CRM: [facility] called to let pcp know that pt has been transferred to [facility] for end of life, contact [phone] if needed

## 2017-09-17 ENCOUNTER — Telehealth: Payer: Self-pay | Admitting: *Deleted

## 2017-09-17 NOTE — Telephone Encounter (Signed)
Received notice of [place] Death from [facility]osp[facility], patient died on 09/21/2017 at 02:41am; forwarded to provider and Debby Freiberg for chart update purposes/SLS 07/31

## 2017-09-18 DEATH — deceased

## 2017-09-25 ENCOUNTER — Telehealth: Payer: Self-pay | Admitting: *Deleted

## 2017-09-25 NOTE — Telephone Encounter (Signed)
Received Physician Orders from [place]; forwarded to supervising provider/SLS 08/08

## 2017-10-01 ENCOUNTER — Telehealth: Payer: Self-pay | Admitting: *Deleted

## 2017-10-01 NOTE — Telephone Encounter (Signed)
Received Physician Orders from [place]; forwarded to provider/SLS 08/14
